# Patient Record
Sex: Male | Born: 1935 | Race: White | Hispanic: No | Marital: Married | Smoking: Former smoker
Health system: Southern US, Community
[De-identification: ages and names within clinical notes are randomized; demographics above are authoritative.]

## PROBLEM LIST (undated history)

## (undated) DIAGNOSIS — E782 Mixed hyperlipidemia: Secondary | ICD-10-CM

## (undated) DIAGNOSIS — R0602 Shortness of breath: Secondary | ICD-10-CM

## (undated) DIAGNOSIS — E119 Type 2 diabetes mellitus without complications: Secondary | ICD-10-CM

## (undated) DIAGNOSIS — J189 Pneumonia, unspecified organism: Secondary | ICD-10-CM

## (undated) DIAGNOSIS — I10 Essential (primary) hypertension: Secondary | ICD-10-CM

## (undated) DIAGNOSIS — I350 Nonrheumatic aortic (valve) stenosis: Secondary | ICD-10-CM

## (undated) DIAGNOSIS — R011 Cardiac murmur, unspecified: Secondary | ICD-10-CM

## (undated) DIAGNOSIS — K5900 Constipation, unspecified: Secondary | ICD-10-CM

## (undated) DIAGNOSIS — E039 Hypothyroidism, unspecified: Secondary | ICD-10-CM

## (undated) DIAGNOSIS — N189 Chronic kidney disease, unspecified: Secondary | ICD-10-CM

## (undated) DIAGNOSIS — I209 Angina pectoris, unspecified: Secondary | ICD-10-CM

## (undated) DIAGNOSIS — D649 Anemia, unspecified: Secondary | ICD-10-CM

## (undated) DIAGNOSIS — I2510 Atherosclerotic heart disease of native coronary artery without angina pectoris: Secondary | ICD-10-CM

## (undated) HISTORY — PX: NEPHRECTOMY: SHX65

## (undated) HISTORY — PX: OTHER SURGICAL HISTORY: SHX169

## (undated) HISTORY — DX: Mixed hyperlipidemia: E78.2

## (undated) HISTORY — PX: CARDIAC CATHETERIZATION: SHX172

## (undated) HISTORY — DX: Type 2 diabetes mellitus without complications: E11.9

## (undated) HISTORY — DX: Essential (primary) hypertension: I10

## (undated) HISTORY — PX: ROTATOR CUFF REPAIR: SHX139

## (undated) HISTORY — PX: COLONOSCOPY: SHX174

## (undated) HISTORY — DX: Atherosclerotic heart disease of native coronary artery without angina pectoris: I25.10

## (undated) HISTORY — DX: Nonrheumatic aortic (valve) stenosis: I35.0

## (undated) HISTORY — PX: VASECTOMY: SHX75

---

## 2001-09-25 ENCOUNTER — Ambulatory Visit (HOSPITAL_COMMUNITY): Admission: RE | Admit: 2001-09-25 | Discharge: 2001-09-26 | Payer: Self-pay | Admitting: Cardiology

## 2004-03-13 ENCOUNTER — Ambulatory Visit: Payer: Self-pay | Admitting: Cardiology

## 2004-03-27 ENCOUNTER — Ambulatory Visit: Payer: Self-pay | Admitting: Cardiology

## 2004-04-24 ENCOUNTER — Ambulatory Visit (HOSPITAL_COMMUNITY): Admission: RE | Admit: 2004-04-24 | Discharge: 2004-04-24 | Payer: Self-pay | Admitting: Orthopedic Surgery

## 2004-05-04 ENCOUNTER — Ambulatory Visit: Payer: Self-pay | Admitting: Internal Medicine

## 2004-06-17 ENCOUNTER — Encounter: Admission: RE | Admit: 2004-06-17 | Discharge: 2004-06-17 | Payer: Self-pay | Admitting: Orthopedic Surgery

## 2004-06-18 ENCOUNTER — Ambulatory Visit (HOSPITAL_BASED_OUTPATIENT_CLINIC_OR_DEPARTMENT_OTHER): Admission: RE | Admit: 2004-06-18 | Discharge: 2004-06-18 | Payer: Self-pay | Admitting: Orthopedic Surgery

## 2004-06-18 ENCOUNTER — Encounter (INDEPENDENT_AMBULATORY_CARE_PROVIDER_SITE_OTHER): Payer: Self-pay | Admitting: Specialist

## 2004-06-18 ENCOUNTER — Ambulatory Visit (HOSPITAL_COMMUNITY): Admission: RE | Admit: 2004-06-18 | Discharge: 2004-06-18 | Payer: Self-pay | Admitting: Orthopedic Surgery

## 2005-04-26 ENCOUNTER — Ambulatory Visit: Payer: Self-pay | Admitting: Cardiology

## 2005-07-28 ENCOUNTER — Ambulatory Visit: Payer: Self-pay | Admitting: Cardiology

## 2006-09-08 ENCOUNTER — Ambulatory Visit: Payer: Self-pay | Admitting: Cardiology

## 2010-06-16 NOTE — Assessment & Plan Note (Signed)
[facility]                          [facility] OFFICE NOTE   MARIE, CHOW                      MRN:          [id number]  DATE:09/08/2006                            DOB:          Jun 11, 1935    PRIMARY CARE PHYSICIAN:  Selinda Flavin, MD   REASON FOR VISIT:  Cardiac followup.   HISTORY OF PRESENT ILLNESS:  Mr. Wissmann returns for a yearly followup. He  has a history of coronary artery disease status post drug eluting stent  placement to the left anterior descending in August 2003. Last ischemic  evaluation was in March of 2007, demonstrating no frank ischemia. He did  have a hypertensive response to exercise at that time and is  hypertensive at rest today. He has been compliant with his medications.  We talked about regular exercise and he has not been participating in a  regular regimen. He has been retired for about a year and a half and has  been doing some traveling. He is not reporting any significant angina.  He does get mildly short of breath with activities, but nothing that has  been progressive.   His electrocardiogram today shows sinus rhythm with leftward axis and  voltage criteria for left ventricular hypertrophy.   ALLERGIES:  CODEINE AND SULFA DRUGS.   PRESENT MEDICATIONS:  1. NovoLog sliding scale.  2. Terazosin 5 mg p.o. q nightly.  3. Simvastatin 80 mg p.o. nightly.  4. Synthroid 100 mcg p.o. daily.  5. Toprol XL 50 mg p.o. daily.  6. Aspirin 81 mg p.o. daily.  7. Lantus 45 units daily.  8. Lisinopril/hydrochlorothiazide 20/25 mg p.o. daily.  9. Januvia 100 mg p.o. daily.  10.Terazosin 5 mg p.o. q nightly.   REVIEW OF SYSTEMS:  As described in History of Present Illness.   PHYSICAL EXAMINATION:  Blood pressure 180/85, heart rate 66. Weight is  197.4 pounds. The patient is comfortable, normally nourished, in no  acute distress.  NECK: Reveals no elevated jugular venous pressure, without bruits. No  thyromegaly  is noted.  LUNGS:  Are clear without labored breathing.  CARDIAC: Reveals a regular rate and rhythm. No S3 gallop or pericardial  rub.  ABDOMEN: Soft. No bruits.  EXTREMITIES: Show no pitting edema.   IMPRESSIONS AND RECOMMENDATIONS:  1. Coronary artery disease, previous drug eluting stent placement to      the left anterior descending. Mr. Savo is not reporting any new      symptoms. Cardiolite last year demonstrated no frank ischemia. I      have recommended a basic exercise regimen beginning with walking      and perhaps a workout routine through the [facility]. He states that he      does qualify for the [employer] program. He remains      hypertensive and I suspect that he will need additional medication      adjustments by Dr.  Dimas Aguas along the way. Present dose of Toprol XL      is keeping his heart rate in the 60s so it may be worth considering  an increase in lisinopril/hydrochlorothiazide versus the addition      of Norvasc to his regimen. He will plan to followup with Dr.      Dimas Aguas who is also following cholesterol. Goal LDL should be around      70. I will plan to see him back in one years time.  2. Further recommendations to follow.     Jonelle Sidle, MD  Electronically Signed    SGM/MedQ  DD: 09/08/2006  DT: 09/08/2006  Job #: [id number]   cc:   Selinda Flavin

## 2010-06-19 NOTE — Discharge Summary (Signed)
NAME:  Corey Riley, Corey Riley NO.:  [id number]   MEDICAL RECORD NO.:  [id number]                   PATIENT TYPE:  OIB   LOCATION:  6531                                 FACILITY:  [facility]   PHYSICIAN:  Learta Codding, M.D. [facility]             DATE OF BIRTH:  04-28-1935   DATE OF ADMISSION:  DATE OF DISCHARGE:                           DISCHARGE SUMMARY - REFERRING   PROCEDURES:  Coronary angiogram, status post LAD.]   REASON FOR ADMISSION:  The patient is a 75 year old male, with no prior  history of heart disease, with cardiac risk factors notable for type 2  diabetes mellitus, age and sex, who was referred to Dr. Lewayne Bunting at  [facility] for a stress echocardiography for evaluation of  progressive exertional dyspnea. The test was markedly abnormal, and the  patient was referred for a diagnostic coronary angiography. Please refer to  dictated admission note for full details.   LABORATORY DATA:  (Preadmission).  Sodium 140, potassium  4.3,  glucose 141,  BUN 17, creatinine 1.2. WBC 6.4. HGPT 12.8, HCT 28.6, platelets 263. INR  0.9.   Chest x-ray:  Normal chest.   HOSPITAL COURSE:  The patient presented for elective cardiac  catheterization, performed by Dr. [name], please see report for full  details, revealing essentially two vessel coronary artery disease with  normal left ventricular function. Specifically, there was 20% OMCA, 75% to  80% proximal LAD; 80% DX 1 lesions including the bifurcation; 20%  proximal/mid CFX; 30% proximal, 40 mid CFX.   Following review of films with Dr. [name], a recommendation was made to  proceed with PCI of the  LAD lesion. The procedure was successful stenting  with Cypher of the  80% LAD lesion to 0% residual stenosis with no noted  complications. Of note, he did attempt to visualize the CFX artery with the  IVUS per Asteroid protocol, but this was  aborted secondary to development  of chest pain.  The patient was   kept for overnight  observation and was  cleared for discharge the following morning in  hemodynamically stable  condition.  Hemoglobin 11.6, BUN 15 and creatinine 1.3 at discharge.   MEDICATIONS:  Medication adjustments:  1. Addition of Plavix for at least three months (Cypher stent).  2. Zocor.  The patient had recently been given prescriptions for Toprol and  nitroglycerin.   DISCHARGE MEDICATIONS:  1. Plavix 75 mg p.o. q.d.  x  3 months.  2. Coated aspirin 325 mg q.d.  3. Metformin 1000 mg b.i.d.  4. Avandia 80 mg q.d.  5. Prandin 4 mg t.i.d.  6. Lotensin HVT 20/2.5 mg q.d.  7. Synthroid 0.175 mg q.d.  8. Toprol XL 25 mg q.d.  9. Glucotrol XL 10 mg q.d.  10.      Zocor 200 mg q.h.s.  11.      Nitrostat 0.4 mg p.r.n.   DISCHARGE INSTRUCTIONS:  The patient is to resume Glucophage Thursday  (September 28, 2001), approximately 48 hours post cardiac catheterization. The  patient is to refrain from any heavy lifting, driving or strenuous activity  for two days.  Low fat, low cholesterol diabetic diet. He may call the  office if there is any swelling  or bleeding at the groin.  The patient was  instructed to refrain from undergoing any MRI testing for two months; he  should be placed on SV prophylaxis x 3 months.   FOLLOW UP:  The patient is scheduled to follow up with Dr. Bufford Buttner. on Tuesday, October 10, 2001, at 3:30 p.m. at [facility].  The patient will return to Dr. Selinda Flavin on an as needed basis.   DISCHARGE DIAGNOSES:  1. Coronary artery disease.     A. Status post stent (Cypher), 80% LAD September 25, 2001.     B. Normal left ventricular function.  2. Multiple cardiac risk factors, type 2 diabetes mellitus.  3. Treated hypothyroidism.  4. History of hypertension.       Laurell Coalson DICTATOR                          Learta Codding, M.D. [facility]    DD/MEDQ  D:  09/26/2001  T:  09/27/2001  Job:  [id number]   cc:   [facility]   Selinda Flavin

## 2010-06-19 NOTE — Cardiovascular Report (Signed)
   NAME:  Corey Riley, Corey Riley NO.:  [id number]   MEDICAL RECORD NO.:  [id number]                   PATIENT TYPE:  OIB   LOCATION:  6531                                 FACILITY:  [facility]   PHYSICIAN:  Noralyn Pick. Eden Emms, M.D. [facility]           DATE OF BIRTH:  Jan 08, 1936   DATE OF PROCEDURE:  09/25/2001  DATE OF DISCHARGE:                              CARDIAC CATHETERIZATION   INDICATIONS FOR PROCEDURE:  A diabetic with exertional dyspnea and positive  stress echocardiogram with anteroapical wall hypokinesis.   DESCRIPTION OF PROCEDURE:  Standard catheterization was done from the right  femoral artery.   FINDINGS:  1. The left main coronary artery had 20% discrete stenosis.  2. The left anterior descending artery had a 70-80% proximal lesion.  The     first diagonal branch was a medium-sized vessel with a bifurcation.     There was a 70-80% lesion at the bifurcation as well as 70-80% lesions in     both inferior and superior bifurcating branches.  3. The ramus branch had 20-30% multiple discrete lesions.  4. The circumflex coronary artery had 20% multiple discrete lesions in the     proximal and mid vessel.  5. The right coronary artery was dominant and has 30% multiple discrete     lesions proximally, 40% mid vessel disease.  6. RAO ventriculography:  RAO ventriculography was normal.  EF was 60%.     There was no gradient across the aortic valve and no MR.  Aortic pressure     was 186/78.  LV pressure was 186/18.   IMPRESSION:  I will review the films with Dr. [name].  The patient  essentially has single-vessel disease although the diagonal branch is medium-  sized.  I think an initial attempt at angioplasty or stenting of the LAD  would be in order.  Whether or not to simply balloon the branches of the  diagonal branch would be left up to the interventionalist.  The patient  could then have a followup stress echocardiogram to further assess  revascularization.   The films will be reviewed with Dr. [name].                                               Noralyn Pick. Eden Emms, M.D. [facility]    PCN/MEDQ  D:  09/25/2001  T:  09/26/2001  Job:  [id number]

## 2010-06-19 NOTE — Cardiovascular Report (Signed)
NAME:  Corey Riley, Corey Riley NO.:  [id number]   MEDICAL RECORD NO.:  [id number]                   PATIENT TYPE:  OIB   LOCATION:  6531                                 FACILITY:  [facility]   PHYSICIAN:  Arturo Morton. [name], M.D. [facility]         DATE OF BIRTH:  [birth year]   DATE OF PROCEDURE:  DATE OF DISCHARGE:  09/26/2001                              CARDIAC CATHETERIZATION   INDICATIONS FOR PROCEDURE:  The patient is a 75 year old gentleman who has  had exertional dyspnea.  He had an abnormal exercise echocardiogram with  ischemia in the LAD territory.  He underwent diagnostic catheterization by  Dr. Eden Emms who recommended percutaneous intervention of the left anterior  descending artery.  I discussed the case with the patient and subsequently  with his wife.  They were in agreement to proceed.  The patient had also  been consented for the ASTEROID study and wished to proceed with this as  well.   PROCEDURE:  1. Percutaneous transluminal coronary angioplasty and stenting of the left     anterior descending artery.  2. Aborted intravascular ultrasound of the circumflex for ASTEROID study     secondary to chest pain.   DESCRIPTION OF PROCEDURE:  The patient had undergone diagnostic  catheterization by Dr. Eden Emms in the catheterization suite.  After  discussion with the patient, we had planned to proceed.  He had already  received clopidogrel prior to the procedure.  A JL3.5 guiding catheter was  utilized to intubate the left anterior descending artery.  Angiomax was  given according to protocol and the ACT was in excess of 200 seconds, and  actually in excess of 300 seconds.  The lesion was crossed with a 0.014 Hi-  Torque Floppy wire and then predilated using a 2.25 CrossSail balloon.  We  then attempted to pass a stent but were unable to pass the 2.5 x 28-mm  CYPHER stent into the artery.  This was due to calcification in the mid  vessel.  We then used a buddy  wire system, specifically using a BMW wire  down the vessel which allowed the stent to easily be passed into the distal  vessel.  The stent was then carefully positioned and the other wire removed.  The stent was then taken up to about 8 atmospheres.  Post dilatation was  done with a 2.5 x 18-mm length PowerSail balloon.  Multiple inflations were  done up to 10 atmospheres with marked improvement in the appearance of the  artery.  All catheters were subsequently removed.  The decision was then  made to proceed on with the ASTEROID study and the wire was gently  manipulated down the circumflex artery.  We gave intracoronary  nitroglycerin.  The patient then subsequently developed some chest pain,  possibly partially related to the intracoronary nitroglycerin  administration, but with the recurrent chest pain, followup views were  obtained which did not reveal significant  renarrowing at the stent site nor  abnormalities of the circumflex that were different from the diagnostic  study.  We removed the wire plus the patient's pressure came back up, and  the chest pain was relieved.  We elected not to proceed on with the ASTEROID  study portion.   ANGIOGRAPHIC DATA:  1. The left anterior descending artery has an 80% segmental stenosis beyond     the origin of the diagonal.  The diagonal itself is diffusely diseased as     described in the diagnostic study.  Following stenting, the LAD is     reduced down from 80% to 0% with a 28-mm length stent.  There was     excellent transition in the stent at both ends and excellent runoff of     the contrast into the distal vessel.  2. The circumflex appears unchanged from the diagnostic study.    CONCLUSION:  Successful percutaneous stenting of the left anterior  descending artery with a drug-eluting CYPHER stent.   The patient will be treated with aspirin and Plavix and be treated  aggressively.  Followup will be with Dr. Andee Lineman.                                                Arturo Morton. [name], M.D. [facility]    TDS/MEDQ  D:  09/25/2001  T:  09/26/2001  Job:  [id number]   cc:   Lorie Phenix, M.D. [facility]   Cardiac Catheterization Lab   Noralyn Pick. Eden Emms, M.D. [facility]

## 2010-06-19 NOTE — Op Note (Signed)
NAME:  Corey Riley, Corey Riley NO.:  [id number]   MEDICAL RECORD NO.:  [id number]          PATIENT TYPE:  AMB   LOCATION:  DSC                          FACILITY:  [facility]   PHYSICIAN:  Katy Fitch. Sypher, M.D. 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   POSTOPERATIVE DIAGNOSIS:  1.  Necrotic full-thickness rotator cuff tear involving supraspinatus      tendon.  2.  Degenerative labral tear with calcification.  3.  Severe AC arthropathy with inferior projecting osteophytes.  4.  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   OPERATIONS:  1.  Is examination of left shoulder under anesthesia followed by gentle      manipulation to relieve capsular adhesions.  2.  Diagnostic arthroscopy with debridement of degenerative labrum and deep      surface rotator cuff tear and documentation of status of glenohumeral      joint.  3.  Subacromial bursectomy and identification of ossified coracoacromial      ligament and AC arthropathy with necrotic full-thickness rotator cuff      tear.  4.  Subacromial decompression with resection of osteophyte coracoacromial      ligament and acromioplasty.  5.  Open distal clavicle resection.  6.  Reconstruction of supraspinatus tendon necrotic tear.   OPERATING SURGEON:  Katy Fitch. Sypher, M.D.   ASSISTANT:  Annye Rusk, PA-C.   ANESTHESIA:  General by endotracheal technique supplemented by interscalene  block.   SUPERVISING ANESTHESIOLOGIST:  Dr. Michelle Piper.   INDICATIONS:  Corey Riley is a 75 year old insulin-dependent diabetic  referred by Dr. Dimas Aguas of [place], [place], for evaluation and  management of a painful left shoulder.   Clinical examination revealed signs of adhesive capsulitis, AC arthropathy  and a probable rotator cuff tear.   An MRI documented a full-thickness rotator cuff tear involving the  supraspinatus tendon and severe AC joint degenerative change with inferior  projecting osteophytes. There was a suggestion of degenerative labral  changes.   Due to a failure to respond to nonoperative measures, [name] was advised  to proceed the operating room at this time.   Given his background medical issues, he was advised to seek preoperative  cardiac screening at [facility] and an evaluation by Dr. Dimas Aguas to  stabilize his medical predicaments.   He was evaluated by [facility] Service and cleared for surgery. He  is brought to the operating room at this time.   Preoperatively, he was noted to have elevated blood glucose.  It was treated  by Dr. Michelle Piper of the anesthesia service with IV insulin.   Preoperatively, he was advised of potential risks and benefits of surgery.  He understands that due to his medical predicament, he has  greater risk for  complications such as deep vein thrombosis,  pulmonary embolus, wound  infection and shoulder stiffness.   After informed consent, he is brought to the operating room at this time.   PROCEDURE:  Corey Riley is brought to the operating room and placed in  supine position upon the operating table.   Following the induction of general orotracheal anesthesia, he was carefully  positioned in the beach-chair position with the aid of a torso and head  holder designed for shoulder arthroscopy.   Ancef 1 g was administered as an IV prophylactic antibiotic.   The entire left upper extremity and forequarter were prepped with DuraPrep  and draped with impervious arthroscopy drapes.   Prior to beginning the procedure, examination of the shoulder under  anesthesia revealed elevation to 160 degrees,  external rotation of 60,  internal rotation of approximately 40 degrees.   With gentle manipulation, elevation was increased 175 degrees, external  rotation to 85 degrees and internal rotation to 70 degrees. There were  clearly adhesions that were gently released.   The shoulder joint was then distended with 20 mL of sterile saline brought  in through an anterior portal with a spinal needle. The scope was placed  atraumatically through a standard posterior viewing portal. Diagnostic  arthroscopy revealed intact hyaline articular cartilage surfaces on the  humeral head and glenoid. The labrum was calcified and degenerative from 2  o'clock anteriorly to 9 o'clock posteriorly. An anterior portal was created  and a suction shaver was used to debride the calcium deposits off the labrum  as well as labral fragments.   The deep surface of the rotator cuff was carefully inspected and found to be  necrotic with a full-thickness tear. This was debrided with a suction  shaver.   The biceps origin was sound and the biceps tendon was normal through the  rotator interval. The infraspinatus and teres minor tendons were intact. The  inferior capsular recesses were unremarkable as were the anterior  glenohumeral ligaments.   The scope was removed from the glenohumeral joint and placed subacromial  space.   A florid bursitis was noted.   After bursectomy was accomplished, there was noted to be extensive  ossification of the coracoacromial ligament and a very prominent AC joint.  The bursal side of the rotator cuff tear was easily visualized and found to  be retracted and quite necrotic with delamination.   At this point, the procedure was converted to open rotator cuff  reconstruction.   A 4 cm incision was fashioned from the [facility] joint across the anterior  acromion. Capsule of AC joint was taken down and the distal 15 mm of clavicle exposed subperiosteally with an osteotome. The meniscus was  removed  from the [facility] joint followed by resection of the distal 15 mm of clavicle,  removing the trumpet bell-shaped osteophytes from the distal clavicle. The  ossified coracoacromial ligament was removed with the rongeur followed by  careful tailoring of the acromion to a type I morphology with an oscillating  saw and hand rasp.   The bursa overlying the cuff tear was debrided with a rongeur followed by  freshening of the margins of the tear with resection of multiple fragments  of calcified rotten supraspinatus tendon. The greater tuberosity was then  decorticated with a power bur followed by placement of a 5.5 mm BioCorkscrew  anchor for medial foot print attachment, followed by placement of a  McLaughlin grasping suture that was placed with a baseball stitch into the  supraspinatus to gather its fibers  and advanced laterally to the  decorticated greater tuberosity.   After completion of the McLaughlin stitch, the tendon was advanced laterally  and two mattress sutures were used to inset the medial foot print with the  BioCorkscrew anchor.   The McLaughlin suture was tensioned, creating a near anatomic repair. A  small defect laterally should fill in the scar at the site of the  decorticated tuberosity.   After further bursal debridement, range of motion was checked. There no  residual signs of impingement.   The anterior third of deltoid was then repaired to the trapezius muscle  closing the dead space created by distal clavicle resection and repairing  the anterior third of the deltoid with through periosteum and muscle sutures  creating a stout repair of the anterior third of deltoid to the acromion.   The wound was then repaired with subdermal sutures of 0-Vicryl, 2-0 Vicryl  and intradermal 3-0 Prolene.   There were no apparent complications.   Mr. Godsey tolerated surgery and anesthesia well. He was transferred to the  recovery room with stable vital signs.   He will be  discharged an approximate 24 hours in the care of his family,  anticipating prescriptions of Dilaudid 2 mg one p.o. q.4-6h. pain 30 tablets  without refill.  Also Motrin 6 mg one p.o. q.6h. p.r.n. pain 30 tablets with  one refill and Keflex 5 mg  one p.o. q.8h. x4 days as a prophylactic  antibiotic.      RVS/MEDQ  D:  06/18/2004  T:  06/18/2004  Job:  [id number]

## 2011-10-08 ENCOUNTER — Ambulatory Visit (INDEPENDENT_AMBULATORY_CARE_PROVIDER_SITE_OTHER): Payer: Medicare Other | Admitting: Urology

## 2011-10-08 DIAGNOSIS — R972 Elevated prostate specific antigen [PSA]: Secondary | ICD-10-CM

## 2011-10-08 DIAGNOSIS — N401 Enlarged prostate with lower urinary tract symptoms: Secondary | ICD-10-CM

## 2012-01-28 ENCOUNTER — Encounter (HOSPITAL_COMMUNITY): Payer: Self-pay

## 2012-02-01 ENCOUNTER — Encounter (HOSPITAL_COMMUNITY): Payer: Self-pay

## 2012-02-01 ENCOUNTER — Encounter (HOSPITAL_COMMUNITY)
Admission: RE | Admit: 2012-02-01 | Discharge: 2012-02-01 | Disposition: A | Discharge status: Home / Self Care | Payer: Medicare Other | Source: Ambulatory Visit | Attending: Ophthalmology | Admitting: Ophthalmology

## 2012-02-01 DIAGNOSIS — E119 Type 2 diabetes mellitus without complications: Secondary | ICD-10-CM | POA: Insufficient documentation

## 2012-02-01 DIAGNOSIS — Z01812 Encounter for preprocedural laboratory examination: Secondary | ICD-10-CM | POA: Insufficient documentation

## 2012-02-01 DIAGNOSIS — Z01810 Encounter for preprocedural cardiovascular examination: Secondary | ICD-10-CM | POA: Insufficient documentation

## 2012-02-01 DIAGNOSIS — I10 Essential (primary) hypertension: Secondary | ICD-10-CM | POA: Insufficient documentation

## 2012-02-01 HISTORY — DX: Hypothyroidism, unspecified: E03.9

## 2012-02-01 LAB — BASIC METABOLIC PANEL
BUN: 64 mg/dL — ABNORMAL HIGH (ref 6–23)
Creatinine, Ser: 2.58 mg/dL — ABNORMAL HIGH (ref 0.50–1.35)
GFR calc Af Amer: 27 mL/min — ABNORMAL LOW (ref >90)
GFR calc non Af Amer: 23 mL/min — ABNORMAL LOW (ref >90)
Glucose, Bld: 297 mg/dL — ABNORMAL HIGH (ref 70–99)

## 2012-02-01 MED ORDER — CYCLOPENTOLATE-PHENYLEPHRINE 0.2-1 % OP SOLN
OPHTHALMIC | Status: AC
  Filled 2012-02-01: qty 2

## 2012-02-01 NOTE — Patient Instructions (Addendum)
Your procedure is scheduled on: 02/07/2012   Report to [facility] at   6:15   AM.  Call this number if you have problems the morning of surgery: [phone]   Remember:   Do not eat or drink :After Midnight.    Take these medicines the morning of surgery with A SIP OF WATER: Lisinopril, Metoprolol, Isosorbide, and Adalat   Do not wear jewelry, make-up or nail polish.  Do not wear lotions, powders, or perfumes. You may wear deodorant.  Do not shave 48 hours prior to surgery.  Do not bring valuables to the hospital.  Contacts, dentures or bridgework may not be worn into surgery.  Patients discharged the day of surgery will not be allowed to drive home.  Name and phone number of your driver:    Please read over the following fact sheets that you were given: Pain Booklet, Surgical Site Infection Prevention, Anesthesia Post-op Instructions and Care and Recovery After Surgery  Cataract Surgery  A cataract is a clouding of the lens of the eye. When a lens becomes cloudy, vision is reduced based on the degree and nature of the clouding. Surgery may be needed to improve vision. Surgery removes the cloudy lens and usually replaces it with a substitute lens (intraocular lens, IOL). LET YOUR EYE DOCTOR KNOW ABOUT:  Allergies to food or medicine.   Medicines taken including herbs, eyedrops, over-the-counter medicines, and creams.   Use of steroids (by mouth or creams).   Previous problems with anesthetics or numbing medicine.   History of bleeding problems or blood clots.   Previous surgery.   Other health problems, including diabetes and kidney problems.   Possibility of pregnancy, if this applies.  RISKS AND COMPLICATIONS  Infection.   Inflammation of the eyeball (endophthalmitis) that can spread to both eyes (sympathetic ophthalmia).   Poor wound healing.   If an IOL is inserted, it can later fall out of proper position. This is very uncommon.   Clouding of the part of your eye  that holds an IOL in place. This is called an "after-cataract." These are uncommon, but easily treated.  BEFORE THE PROCEDURE  Do not eat or drink anything except small amounts of water for 8 to 12 before your surgery, or as directed by your caregiver.   Unless you are told otherwise, continue any eyedrops you have been prescribed.   Talk to your primary caregiver about all other medicines that you take (both prescription and non-prescription). In some cases, you may need to stop or change medicines near the time of your surgery. This is most important if you are taking blood-thinning medicine.Do not stop medicines unless you are told to do so.   Arrange for someone to drive you to and from the procedure.   Do not put contact lenses in either eye on the day of your surgery.  PROCEDURE There is more than one method for safely removing a cataract. Your doctor can explain the differences and help determine which is best for you. Phacoemulsification surgery is the most common form of cataract surgery.  An injection is given behind the eye or eyedrops are given to make this a painless procedure.   A small cut (incision) is made on the edge of the clear, dome-shaped surface that covers the front of the eye (cornea).   A tiny probe is painlessly inserted into the eye. This device gives off ultrasound waves that soften and break up the cloudy center of the lens. This  makes it easier for the cloudy lens to be removed by suction.   An IOL may be implanted.   The normal lens of the eye is covered by a clear capsule. Part of that capsule is intentionally left in the eye to support the IOL.   Your surgeon may or may not use stitches to close the incision.  There are other forms of cataract surgery that require a larger incision and stiches to close the eye. This approach is taken in cases where the doctor feels that the cataract cannot be easily removed using phacoemulsification. AFTER THE  PROCEDURE  When an IOL is implanted, it does not need care. It becomes a permanent part of your eye and cannot be seen or felt.   Your doctor will schedule follow-up exams to check on your progress.   Review your other medicines with your doctor to see which can be resumed after surgery.   Use eyedrops or take medicine as prescribed by your doctor.  Document Released: 01/07/2011 Document Reviewed: 01/04/2011 [facility] Patient Information 2012 [place].  .Cataract Surgery Care After Refer to this sheet in the next few weeks. These instructions provide you with information on caring for yourself after your procedure. Your caregiver may also give you more specific instructions. Your treatment has been planned according to current medical practices, but problems sometimes occur. Call your caregiver if you have any problems or questions after your procedure.  HOME CARE INSTRUCTIONS   Avoid strenuous activities as directed by your caregiver.   Ask your caregiver when you can resume driving.   Use eyedrops or other medicines to help healing and control pressure inside your eye as directed by your caregiver.   Only take over-the-counter or prescription medicines for pain, discomfort, or fever as directed by your caregiver.   Do not to touch or rub your eyes.   You may be instructed to use a protective shield during the first few days and nights after surgery. If not, wear sunglasses to protect your eyes. This is to protect the eye from pressure or from being accidentally bumped.   Keep the area around your eye clean and dry. Avoid swimming or allowing water to hit you directly in the face while showering. Keep soap and shampoo out of your eyes.   Do not bend or lift heavy objects. Bending increases pressure in the eye. You can walk, climb stairs, and do light household chores.   Do not put a contact lens into the eye that had surgery until your caregiver says it is okay to do so.    Ask your doctor when you can return to work. This will depend on the kind of work that you do. If you work in a dusty environment, you may be advised to wear protective eyewear for a period of time.   Ask your caregiver when it will be safe to engage in sexual activity.   Continue with your regular eye exams as directed by your caregiver.  What to expect:  It is normal to feel itching and mild discomfort for a few days after cataract surgery. Some fluid discharge is also common, and your eye may be sensitive to light and touch.   After 1 to 2 days, even moderate discomfort should disappear. In most cases, healing will take about 6 weeks.   If you received an intraocular lens (IOL), you may notice that colors are very bright or have a blue tinge. Also, if you have been in bright  sunlight, everything may appear reddish for a few hours. If you see these color tinges, it is because your lens is clear and no longer cloudy. Within a few months after receiving an IOL, these extra colors should go away. When you have healed, you will probably need new glasses.  SEEK MEDICAL CARE IF:   You have increased bruising around your eye.   You have discomfort not helped by medicine.  SEEK IMMEDIATE MEDICAL CARE IF:   You have a fever.   You have a worsening or sudden vision loss.   You have redness, swelling, or increasing pain in the eye.   You have a thick discharge from the eye that had surgery.  MAKE SURE YOU:  Understand these instructions.   Will watch your condition.   Will get help right away if you are not doing well or get worse.  Document Released: 08/07/2004 Document Revised: 01/07/2011 Document Reviewed: 09/11/2010 [facility] Patient Information 2012 [place], [place].

## 2012-02-01 NOTE — Pre-Procedure Instructions (Signed)
Dr Jayme Cloud notified of labs from [facility].  K 5.6, BUN 59, Creat 2.82.  Ordered to repeat BMP

## 2012-02-07 ENCOUNTER — Encounter (HOSPITAL_COMMUNITY): Payer: Self-pay | Admitting: Anesthesiology

## 2012-02-07 ENCOUNTER — Encounter (HOSPITAL_COMMUNITY): Payer: Self-pay | Admitting: *Deleted

## 2012-02-07 ENCOUNTER — Ambulatory Visit (HOSPITAL_COMMUNITY)
Admission: RE | Admit: 2012-02-07 | Discharge: 2012-02-07 | Disposition: A | Discharge status: Home / Self Care | Payer: Medicare Other | Source: Ambulatory Visit | Attending: Ophthalmology | Admitting: Ophthalmology

## 2012-02-07 ENCOUNTER — Encounter (HOSPITAL_COMMUNITY)
Admission: RE | Disposition: A | Discharge status: Home / Self Care | Payer: Self-pay | Source: Ambulatory Visit | Attending: Ophthalmology

## 2012-02-07 ENCOUNTER — Ambulatory Visit (HOSPITAL_COMMUNITY): Payer: Medicare Other | Admitting: Anesthesiology

## 2012-02-07 DIAGNOSIS — I10 Essential (primary) hypertension: Secondary | ICD-10-CM | POA: Insufficient documentation

## 2012-02-07 DIAGNOSIS — E119 Type 2 diabetes mellitus without complications: Secondary | ICD-10-CM | POA: Insufficient documentation

## 2012-02-07 DIAGNOSIS — H2510 Age-related nuclear cataract, unspecified eye: Secondary | ICD-10-CM | POA: Insufficient documentation

## 2012-02-07 DIAGNOSIS — H5200 Hypermetropia, unspecified eye: Secondary | ICD-10-CM | POA: Insufficient documentation

## 2012-02-07 DIAGNOSIS — H524 Presbyopia: Secondary | ICD-10-CM | POA: Insufficient documentation

## 2012-02-07 DIAGNOSIS — Z794 Long term (current) use of insulin: Secondary | ICD-10-CM | POA: Insufficient documentation

## 2012-02-07 DIAGNOSIS — Z79899 Other long term (current) drug therapy: Secondary | ICD-10-CM | POA: Insufficient documentation

## 2012-02-07 DIAGNOSIS — H52209 Unspecified astigmatism, unspecified eye: Secondary | ICD-10-CM | POA: Insufficient documentation

## 2012-02-07 DIAGNOSIS — L719 Rosacea, unspecified: Secondary | ICD-10-CM | POA: Insufficient documentation

## 2012-02-07 DIAGNOSIS — Z7982 Long term (current) use of aspirin: Secondary | ICD-10-CM | POA: Insufficient documentation

## 2012-02-07 HISTORY — PX: CATARACT EXTRACTION W/PHACO: SHX586

## 2012-02-07 LAB — GLUCOSE, CAPILLARY: Glucose-Capillary: 80 mg/dL (ref 70–99)

## 2012-02-07 SURGERY — PHACOEMULSIFICATION, CATARACT, WITH IOL INSERTION
Anesthesia: Monitor Anesthesia Care | Site: Eye | Laterality: Right | Wound class: Clean

## 2012-02-07 MED ORDER — GATIFLOXACIN 0.5 % OP SOLN OPTIME - NO CHARGE
OPHTHALMIC | Status: DC | PRN
  Administered 2012-02-07: 2 [drp] via OPHTHALMIC

## 2012-02-07 MED ORDER — KETOROLAC TROMETHAMINE 0.5 % OP SOLN: 1.0000 [drp] | Freq: Once | OPHTHALMIC | Status: AC

## 2012-02-07 MED ORDER — EPINEPHRINE HCL 1 MG/ML IJ SOLN
INTRAOCULAR | Status: DC | PRN
  Administered 2012-02-07: 08:00:00

## 2012-02-07 MED ORDER — KETOROLAC TROMETHAMINE 0.4 % OP SOLN - NO CHARGE
1.0000 [drp] | Freq: Once | OPHTHALMIC | Status: AC
  Administered 2012-02-07: 1 [drp] via OPHTHALMIC
  Filled 2012-02-07: qty 5

## 2012-02-07 MED ORDER — EPINEPHRINE HCL 1 MG/ML IJ SOLN
INTRAMUSCULAR | Status: AC
  Filled 2012-02-07: qty 1

## 2012-02-07 MED ORDER — ONDANSETRON HCL 4 MG/2ML IJ SOLN: 4.0000 mg | Freq: Once | INTRAMUSCULAR | Status: DC | PRN

## 2012-02-07 MED ORDER — TETRACAINE 0.5 % OP SOLN OPTIME - NO CHARGE
OPHTHALMIC | Status: DC | PRN
  Administered 2012-02-07: 2 [drp] via OPHTHALMIC

## 2012-02-07 MED ORDER — NA HYALUR & NA CHOND-NA HYALUR 0.55-0.5 ML IO KIT
PACK | INTRAOCULAR | Status: DC | PRN
  Administered 2012-02-07: 1 via OPHTHALMIC

## 2012-02-07 MED ORDER — BSS IO SOLN
INTRAOCULAR | Status: DC | PRN
  Administered 2012-02-07: 15 mL via INTRAOCULAR

## 2012-02-07 MED ORDER — LIDOCAINE 3.5 % OP GEL OPTIME - NO CHARGE
OPHTHALMIC | Status: DC | PRN
  Administered 2012-02-07: 2 [drp] via OPHTHALMIC

## 2012-02-07 MED ORDER — TETRACAINE HCL 0.5 % OP SOLN: 1.0000 [drp] | Freq: Once | OPHTHALMIC | Status: DC

## 2012-02-07 MED ORDER — MIDAZOLAM HCL 2 MG/2ML IJ SOLN
1.0000 mg | INTRAMUSCULAR | Status: DC | PRN
  Administered 2012-02-07: 2 mg via INTRAVENOUS

## 2012-02-07 MED ORDER — LACTATED RINGERS IV SOLN
INTRAVENOUS | Status: DC
  Administered 2012-02-07: 1000 mL via INTRAVENOUS

## 2012-02-07 MED ORDER — MOXIFLOXACIN HCL 0.5 % OP SOLN - NO CHARGE
1.0000 [drp] | Freq: Once | OPHTHALMIC | Status: AC
  Administered 2012-02-07: 1 [drp] via OPHTHALMIC
  Filled 2012-02-07: qty 3

## 2012-02-07 MED ORDER — TRYPAN BLUE 0.06 % OP SOLN
OPHTHALMIC | Status: AC
  Filled 2012-02-07: qty 0.5

## 2012-02-07 MED ORDER — CARBACHOL 0.01 % IO SOLN
INTRAOCULAR | Status: AC
  Filled 2012-02-07: qty 1.5

## 2012-02-07 MED ORDER — GATIFLOXACIN 0.5 % OP SOLN
1.0000 [drp] | Freq: Once | OPHTHALMIC | Status: AC
  Administered 2012-02-07: 1 [drp] via OPHTHALMIC

## 2012-02-07 MED ORDER — LIDOCAINE HCL 3.5 % OP GEL
OPHTHALMIC | Status: AC
  Filled 2012-02-07: qty 5

## 2012-02-07 MED ORDER — TETRACAINE HCL 0.5 % OP SOLN
OPHTHALMIC | Status: AC
  Filled 2012-02-07: qty 2

## 2012-02-07 MED ORDER — FENTANYL CITRATE 0.05 MG/ML IJ SOLN: 25.0000 ug | INTRAMUSCULAR | Status: DC | PRN

## 2012-02-07 MED ORDER — CYCLOPENTOLATE-PHENYLEPHRINE 0.2-1 % OP SOLN: 1.0000 [drp] | Freq: Once | OPHTHALMIC | Status: AC

## 2012-02-07 MED ORDER — LIDOCAINE HCL 3.5 % OP GEL: 1.0000 "application " | Freq: Once | OPHTHALMIC | Status: DC

## 2012-02-07 MED ORDER — LIDOCAINE HCL (PF) 1 % IJ SOLN
INTRAMUSCULAR | Status: AC
  Filled 2012-02-07: qty 2

## 2012-02-07 MED ORDER — MIDAZOLAM HCL 2 MG/2ML IJ SOLN
INTRAMUSCULAR | Status: AC
  Filled 2012-02-07: qty 2

## 2012-02-07 SURGICAL SUPPLY — 27 items
CAPSULAR TENSION RING-AMO (OPHTHALMIC RELATED) IMPLANT
CLOTH BEACON ORANGE TIMEOUT ST (SAFETY) ×1 IMPLANT
GLOVE BIO SURGEON STRL SZ7.5 (GLOVE) IMPLANT
GLOVE BIOGEL M 6.5 STRL (GLOVE) IMPLANT
GLOVE BIOGEL PI IND STRL 6.5 (GLOVE) IMPLANT
GLOVE BIOGEL PI IND STRL 7.0 (GLOVE) IMPLANT
GLOVE BIOGEL PI INDICATOR 6.5 (GLOVE)
GLOVE BIOGEL PI INDICATOR 7.0 (GLOVE) ×1
GLOVE ECLIPSE 6.5 STRL STRAW (GLOVE) IMPLANT
GLOVE ECLIPSE 7.5 STRL STRAW (GLOVE) IMPLANT
GLOVE EXAM NITRILE LRG STRL (GLOVE) IMPLANT
GLOVE EXAM NITRILE MD LF STRL (GLOVE) ×1 IMPLANT
GLOVE SKINSENSE NS SZ6.5 (GLOVE)
GLOVE SKINSENSE NS SZ7.0 (GLOVE)
GLOVE SKINSENSE STRL SZ6.5 (GLOVE) IMPLANT
GLOVE SKINSENSE STRL SZ7.0 (GLOVE) IMPLANT
INST SET CATARACT ~~LOC~~ (KITS) ×2 IMPLANT
KIT VITRECTOMY (OPHTHALMIC RELATED) IMPLANT
PAD ARMBOARD 7.5X6 YLW CONV (MISCELLANEOUS) ×1 IMPLANT
PROC W NO LENS (INTRAOCULAR LENS)
PROC W SPEC LENS (INTRAOCULAR LENS)
PROCESS W NO LENS (INTRAOCULAR LENS) IMPLANT
PROCESS W SPEC LENS (INTRAOCULAR LENS) IMPLANT
RING MALYGIN (MISCELLANEOUS) IMPLANT
SIGHTPATH CAT PROC W REG LENS (Ophthalmic Related) ×2 IMPLANT
VISCOELASTIC ADDITIONAL (OPHTHALMIC RELATED) IMPLANT
WATER STERILE IRR 250ML POUR (IV SOLUTION) ×1 IMPLANT

## 2012-02-07 NOTE — Brief Op Note (Signed)
02/07/2012  8:21 AM  PATIENT:  Corey Riley  77 y.o. male  PRE-OPERATIVE DIAGNOSIS:  nuclear cataract right eye  POST-OPERATIVE DIAGNOSIS:  nuclear cataract right eye  PROCEDURE:  Procedure(s): CATARACT EXTRACTION PHACO AND INTRAOCULAR LENS PLACEMENT (IOC)  SURGEON:  Surgeon(s): Susa Simmonds, MD  ASSISTANTS: Valda Lamb, CST   ANESTHESIA STAFF: Moshe Salisbury, CRNA - CRNA Laurene Footman, MD - Anesthesiologist  ANESTHESIA:   topical and MAC  REQUESTED LENS POWER: 23.0  LENS IMPLANT INFORMATION:   Alcon [id number] s/n [id number]  Exp 03/2016  CUMULATIVE DISSIPATED ENERGY:24.87  INDICATIONS:see H&P  OP FINDINGS:dense NS  COMPLICATIONS:None  DICTATION #: see scanned op note  PLAN OF CARE: per H&P  PATIENT DISPOSITION:  Short Stay

## 2012-02-07 NOTE — H&P (Signed)
I have reviewed the pre printed H&P, the patient was re-examined, and I have identified no significant interval changes in the patient's medical condition.  There is no change in the plan of care since the history and physical of record. 

## 2012-02-07 NOTE — Op Note (Signed)
See scanned note done today

## 2012-02-07 NOTE — Anesthesia Postprocedure Evaluation (Signed)
  Anesthesia Post-op Note  Patient: Corey Riley  Procedure(s) Performed: Procedure(s) (LRB) with comments: CATARACT EXTRACTION PHACO AND INTRAOCULAR LENS PLACEMENT (IOC) (Right) - CDE:24.87  Patient Location: PACU and Short Stay  Anesthesia Type:MAC  Level of Consciousness: awake, alert  and oriented  Airway and Oxygen Therapy: Patient Spontanous Breathing  Post-op Pain: none  Post-op Assessment: Post-op Vital signs reviewed, Patient's Cardiovascular Status Stable, Respiratory Function Stable, Patent Airway and No signs of Nausea or vomiting  Post-op Vital Signs: Reviewed and stable  Complications: No apparent anesthesia complications

## 2012-02-07 NOTE — Anesthesia Preprocedure Evaluation (Signed)
Anesthesia Evaluation  Patient identified by MRN, date of birth, ID band Patient awake    Reviewed: Allergy & Precautions, H&P , NPO status , Patient's Chart, lab work & pertinent test results  Airway Mallampati: II TM Distance: >3 FB     Dental  (+) Teeth Intact   Pulmonary former smoker,  breath sounds clear to auscultation        Cardiovascular hypertension, Pt. on medications - angina+ CAD and + Cardiac Stents Rhythm:Regular Rate:Normal     Neuro/Psych    GI/Hepatic   Endo/Other  diabetes, Poorly Controlled, Type 2Hypothyroidism   Renal/GU      Musculoskeletal   Abdominal   Peds  Hematology   Anesthesia Other Findings   Reproductive/Obstetrics                           Anesthesia Physical Anesthesia Plan  ASA: III  Anesthesia Plan: MAC   Post-op Pain Management:    Induction: Intravenous  Airway Management Planned: Nasal Cannula  Additional Equipment:   Intra-op Plan:   Post-operative Plan:   Informed Consent: I have reviewed the patients History and Physical, chart, labs and discussed the procedure including the risks, benefits and alternatives for the proposed anesthesia with the patient or authorized representative who has indicated his/her understanding and acceptance.     Plan Discussed with:   Anesthesia Plan Comments:         Anesthesia Quick Evaluation

## 2012-02-07 NOTE — Transfer of Care (Signed)
Immediate Anesthesia Transfer of Care Note  Patient: Corey Riley  Procedure(s) Performed: Procedure(s) (LRB) with comments: CATARACT EXTRACTION PHACO AND INTRAOCULAR LENS PLACEMENT (IOC) (Right) - CDE:24.87  Patient Location: PACU and Short Stay  Anesthesia Type:MAC  Level of Consciousness: awake, alert  and oriented  Airway & Oxygen Therapy: Patient Spontanous Breathing  Post-op Assessment: Report given to PACU RN  Post vital signs: Reviewed  Complications: No apparent anesthesia complications

## 2012-02-08 ENCOUNTER — Encounter (HOSPITAL_COMMUNITY): Payer: Self-pay | Admitting: Ophthalmology

## 2012-02-11 DIAGNOSIS — R011 Cardiac murmur, unspecified: Secondary | ICD-10-CM

## 2012-02-17 ENCOUNTER — Telehealth: Payer: Self-pay | Admitting: Cardiology

## 2012-02-17 NOTE — Telephone Encounter (Signed)
Dr Dimas Aguas needs to know if Echo you read showed that the stenosis is better or worse.  Patient is scheduled to see you on march 3rd. Dr Dimas Aguas would also like to know if this appointment would be good with results of Echo

## 2012-02-18 NOTE — Telephone Encounter (Signed)
Dr. Dimas Aguas informed and request this message be forwarded to his office.

## 2012-02-18 NOTE — Telephone Encounter (Signed)
Recent echocardiogram read on 1/10 showed only mild aortic stenosis. I do not see a previous echocardiogram in EMR for comparison, and therefore cannot comment on progression. At this point however, doubt that this is of major clinical significance since it is in the mild range, and we can followup on this at his office visit.  Jonelle Sidle, M.D., F.A.C.C.

## 2012-04-05 ENCOUNTER — Encounter: Payer: Self-pay | Admitting: Cardiology

## 2012-04-05 DIAGNOSIS — I350 Nonrheumatic aortic (valve) stenosis: Secondary | ICD-10-CM | POA: Insufficient documentation

## 2012-04-05 DIAGNOSIS — I2510 Atherosclerotic heart disease of native coronary artery without angina pectoris: Secondary | ICD-10-CM | POA: Insufficient documentation

## 2012-04-05 DIAGNOSIS — I10 Essential (primary) hypertension: Secondary | ICD-10-CM | POA: Insufficient documentation

## 2012-04-05 DIAGNOSIS — E782 Mixed hyperlipidemia: Secondary | ICD-10-CM | POA: Insufficient documentation

## 2012-04-07 ENCOUNTER — Ambulatory Visit: Payer: Medicare Other | Admitting: Cardiology

## 2012-04-11 ENCOUNTER — Encounter (HOSPITAL_COMMUNITY): Payer: Self-pay

## 2012-04-11 ENCOUNTER — Encounter (HOSPITAL_COMMUNITY)
Admission: RE | Admit: 2012-04-11 | Discharge: 2012-04-11 | Disposition: A | Discharge status: Home / Self Care | Payer: Medicare Other | Source: Ambulatory Visit | Attending: Ophthalmology | Admitting: Ophthalmology

## 2012-04-11 HISTORY — DX: Shortness of breath: R06.02

## 2012-04-11 HISTORY — DX: Chronic kidney disease, unspecified: N18.9

## 2012-04-11 HISTORY — DX: Anemia, unspecified: D64.9

## 2012-04-11 LAB — BASIC METABOLIC PANEL
CO2: 22 mEq/L (ref 19–32)
Calcium: 8.8 mg/dL (ref 8.4–10.5)
Chloride: 105 mEq/L (ref 96–112)
Glucose, Bld: 145 mg/dL — ABNORMAL HIGH (ref 70–99)
Sodium: 139 mEq/L (ref 135–145)

## 2012-04-11 LAB — HEMOGLOBIN AND HEMATOCRIT, BLOOD
HCT: 26.5 % — ABNORMAL LOW (ref 39.0–52.0)
Hemoglobin: 8.9 g/dL — ABNORMAL LOW (ref 13.0–17.0)

## 2012-04-11 MED ORDER — CYCLOPENTOLATE HCL 1 % OP SOLN
OPHTHALMIC | Status: AC
  Filled 2012-04-11: qty 2

## 2012-04-11 MED ORDER — CYCLOPENTOLATE-PHENYLEPHRINE 0.2-1 % OP SOLN
OPHTHALMIC | Status: AC
  Filled 2012-04-11: qty 2

## 2012-04-11 NOTE — Patient Instructions (Addendum)
Your procedure is scheduled on:  04/17/2012  Report to [facility] at   7:00   AM.  Call this number if you have problems the morning of surgery: [phone]   Remember:   Do not eat or drink :After Midnight.    Take these medicines the morning of surgery with A SIP OF WATER: levothroxine, Lisinopril, Metoprolol,and Nifedipine   Do not wear jewelry, make-up or nail polish.  Do not wear lotions, powders, or perfumes. You may wear deodorant.  Do not shave 48 hours prior to surgery.  Do not bring valuables to the hospital.  Contacts, dentures or bridgework may not be worn into surgery.  Patients discharged the day of surgery will not be allowed to drive home.  Name and phone number of your driver:    Please read over the following fact sheets that you were given: Pain Booklet, Surgical Site Infection Prevention, Anesthesia Post-op Instructions and Care and Recovery After Surgery  Cataract Surgery  A cataract is a clouding of the lens of the eye. When a lens becomes cloudy, vision is reduced based on the degree and nature of the clouding. Surgery may be needed to improve vision. Surgery removes the cloudy lens and usually replaces it with a substitute lens (intraocular lens, IOL). LET YOUR EYE DOCTOR KNOW ABOUT:  Allergies to food or medicine.   Medicines taken including herbs, eyedrops, over-the-counter medicines, and creams.   Use of steroids (by mouth or creams).   Previous problems with anesthetics or numbing medicine.   History of bleeding problems or blood clots.   Previous surgery.   Other health problems, including diabetes and kidney problems.   Possibility of pregnancy, if this applies.  RISKS AND COMPLICATIONS  Infection.   Inflammation of the eyeball (endophthalmitis) that can spread to both eyes (sympathetic ophthalmia).   Poor wound healing.   If an IOL is inserted, it can later fall out of proper position. This is very uncommon.   Clouding of the part of  your eye that holds an IOL in place. This is called an "after-cataract." These are uncommon, but easily treated.  BEFORE THE PROCEDURE  Do not eat or drink anything except small amounts of water for 8 to 12 before your surgery, or as directed by your caregiver.   Unless you are told otherwise, continue any eyedrops you have been prescribed.   Talk to your primary caregiver about all other medicines that you take (both prescription and non-prescription). In some cases, you may need to stop or change medicines near the time of your surgery. This is most important if you are taking blood-thinning medicine.Do not stop medicines unless you are told to do so.   Arrange for someone to drive you to and from the procedure.   Do not put contact lenses in either eye on the day of your surgery.  PROCEDURE There is more than one method for safely removing a cataract. Your doctor can explain the differences and help determine which is best for you. Phacoemulsification surgery is the most common form of cataract surgery.  An injection is given behind the eye or eyedrops are given to make this a painless procedure.   A small cut (incision) is made on the edge of the clear, dome-shaped surface that covers the front of the eye (cornea).   A tiny probe is painlessly inserted into the eye. This device gives off ultrasound waves that soften and break up the cloudy center of the lens. This makes  it easier for the cloudy lens to be removed by suction.   An IOL may be implanted.   The normal lens of the eye is covered by a clear capsule. Part of that capsule is intentionally left in the eye to support the IOL.   Your surgeon may or may not use stitches to close the incision.  There are other forms of cataract surgery that require a larger incision and stiches to close the eye. This approach is taken in cases where the doctor feels that the cataract cannot be easily removed using phacoemulsification. AFTER THE  PROCEDURE  When an IOL is implanted, it does not need care. It becomes a permanent part of your eye and cannot be seen or felt.   Your doctor will schedule follow-up exams to check on your progress.   Review your other medicines with your doctor to see which can be resumed after surgery.   Use eyedrops or take medicine as prescribed by your doctor.  Document Released: 01/07/2011 Document Reviewed: 01/04/2011 [facility] Patient Information 2012 [place].  .Cataract Surgery Care After Refer to this sheet in the next few weeks. These instructions provide you with information on caring for yourself after your procedure. Your caregiver may also give you more specific instructions. Your treatment has been planned according to current medical practices, but problems sometimes occur. Call your caregiver if you have any problems or questions after your procedure.  HOME CARE INSTRUCTIONS   Avoid strenuous activities as directed by your caregiver.   Ask your caregiver when you can resume driving.   Use eyedrops or other medicines to help healing and control pressure inside your eye as directed by your caregiver.   Only take over-the-counter or prescription medicines for pain, discomfort, or fever as directed by your caregiver.   Do not to touch or rub your eyes.   You may be instructed to use a protective shield during the first few days and nights after surgery. If not, wear sunglasses to protect your eyes. This is to protect the eye from pressure or from being accidentally bumped.   Keep the area around your eye clean and dry. Avoid swimming or allowing water to hit you directly in the face while showering. Keep soap and shampoo out of your eyes.   Do not bend or lift heavy objects. Bending increases pressure in the eye. You can walk, climb stairs, and do light household chores.   Do not put a contact lens into the eye that had surgery until your caregiver says it is okay to do so.    Ask your doctor when you can return to work. This will depend on the kind of work that you do. If you work in a dusty environment, you may be advised to wear protective eyewear for a period of time.   Ask your caregiver when it will be safe to engage in sexual activity.   Continue with your regular eye exams as directed by your caregiver.  What to expect:  It is normal to feel itching and mild discomfort for a few days after cataract surgery. Some fluid discharge is also common, and your eye may be sensitive to light and touch.   After 1 to 2 days, even moderate discomfort should disappear. In most cases, healing will take about 6 weeks.   If you received an intraocular lens (IOL), you may notice that colors are very bright or have a blue tinge. Also, if you have been in bright sunlight,  everything may appear reddish for a few hours. If you see these color tinges, it is because your lens is clear and no longer cloudy. Within a few months after receiving an IOL, these extra colors should go away. When you have healed, you will probably need new glasses.  SEEK MEDICAL CARE IF:   You have increased bruising around your eye.   You have discomfort not helped by medicine.  SEEK IMMEDIATE MEDICAL CARE IF:   You have a fever.   You have a worsening or sudden vision loss.   You have redness, swelling, or increasing pain in the eye.   You have a thick discharge from the eye that had surgery.  MAKE SURE YOU:  Understand these instructions.   Will watch your condition.   Will get help right away if you are not doing well or get worse.  Document Released: 08/07/2004 Document Revised: 01/07/2011 Document Reviewed: 09/11/2010 [facility] Patient Information 2012 [place].

## 2012-04-12 NOTE — Pre-Procedure Instructions (Signed)
Dr Jayme Cloud shown labs. Patient needs to be worked up before surgery done. Dr Dimas Aguas medical MD also notified. Dr Lita Mains notified as well, will keep patient on schedule for now until he contacts Dr Dimas Aguas about pt's plan of care.

## 2012-04-13 NOTE — Progress Notes (Signed)
Phone call from Dr Lita Mains' office.  Per Dr Lita Mains, pt needs STAT potassium level upon arrival on day of surgery (04/17/12) for cataract removal.  Patient's K+ 5.8 on 04/11/12.  Notation made on front of chart for this to be done.

## 2012-04-14 ENCOUNTER — Encounter (HOSPITAL_COMMUNITY): Payer: Self-pay | Admitting: Pharmacy Technician

## 2012-04-17 ENCOUNTER — Ambulatory Visit (HOSPITAL_COMMUNITY): Payer: Medicare Other | Admitting: Anesthesiology

## 2012-04-17 ENCOUNTER — Encounter (HOSPITAL_COMMUNITY): Payer: Self-pay | Admitting: *Deleted

## 2012-04-17 ENCOUNTER — Ambulatory Visit (HOSPITAL_COMMUNITY)
Admission: RE | Admit: 2012-04-17 | Discharge: 2012-04-17 | Disposition: A | Discharge status: Home Health | Payer: Medicare Other | Source: Ambulatory Visit | Attending: Ophthalmology | Admitting: Ophthalmology

## 2012-04-17 ENCOUNTER — Encounter (HOSPITAL_COMMUNITY)
Admission: RE | Disposition: A | Discharge status: Home Health | Payer: Self-pay | Source: Ambulatory Visit | Attending: Ophthalmology

## 2012-04-17 ENCOUNTER — Encounter (HOSPITAL_COMMUNITY): Payer: Self-pay | Admitting: Anesthesiology

## 2012-04-17 DIAGNOSIS — H2510 Age-related nuclear cataract, unspecified eye: Secondary | ICD-10-CM | POA: Insufficient documentation

## 2012-04-17 DIAGNOSIS — I10 Essential (primary) hypertension: Secondary | ICD-10-CM | POA: Insufficient documentation

## 2012-04-17 DIAGNOSIS — Z01812 Encounter for preprocedural laboratory examination: Secondary | ICD-10-CM | POA: Insufficient documentation

## 2012-04-17 DIAGNOSIS — E119 Type 2 diabetes mellitus without complications: Secondary | ICD-10-CM | POA: Insufficient documentation

## 2012-04-17 HISTORY — PX: CATARACT EXTRACTION W/PHACO: SHX586

## 2012-04-17 SURGERY — PHACOEMULSIFICATION, CATARACT, WITH IOL INSERTION
Anesthesia: Monitor Anesthesia Care | Laterality: Left | Wound class: Clean

## 2012-04-17 MED ORDER — GATIFLOXACIN 0.5 % OP SOLN OPTIME - NO CHARGE
1.0000 [drp] | Freq: Once | OPHTHALMIC | Status: AC
  Administered 2012-04-17: 1 [drp] via OPHTHALMIC
  Filled 2012-04-17: qty 2.5

## 2012-04-17 MED ORDER — EPINEPHRINE HCL 1 MG/ML IJ SOLN
INTRAOCULAR | Status: DC | PRN
  Administered 2012-04-17: 09:00:00

## 2012-04-17 MED ORDER — TETRACAINE 0.5 % OP SOLN OPTIME - NO CHARGE
OPHTHALMIC | Status: DC | PRN
  Administered 2012-04-17: 2 [drp] via OPHTHALMIC

## 2012-04-17 MED ORDER — BSS IO SOLN
INTRAOCULAR | Status: DC | PRN
  Administered 2012-04-17: 15 mL via INTRAOCULAR

## 2012-04-17 MED ORDER — DEXTROSE 50 % IV SOLN
12.5000 g | Freq: Once | INTRAVENOUS | Status: AC
  Administered 2012-04-17: 12.5 g via INTRAVENOUS

## 2012-04-17 MED ORDER — MIDAZOLAM HCL 2 MG/2ML IJ SOLN
1.0000 mg | INTRAMUSCULAR | Status: DC | PRN
  Administered 2012-04-17: 2 mg via INTRAVENOUS

## 2012-04-17 MED ORDER — MIDAZOLAM HCL 2 MG/2ML IJ SOLN
INTRAMUSCULAR | Status: AC
  Filled 2012-04-17: qty 2

## 2012-04-17 MED ORDER — MOXIFLOXACIN HCL 0.5 % OP SOLN - NO CHARGE
1.0000 [drp] | Freq: Once | OPHTHALMIC | Status: DC
  Filled 2012-04-17: qty 3

## 2012-04-17 MED ORDER — LIDOCAINE 3.5 % OP GEL OPTIME - NO CHARGE
OPHTHALMIC | Status: DC | PRN
  Administered 2012-04-17: 2 [drp] via OPHTHALMIC

## 2012-04-17 MED ORDER — CYCLOPENTOLATE-PHENYLEPHRINE 0.2-1 % OP SOLN
1.0000 [drp] | Freq: Once | OPHTHALMIC | Status: AC
  Administered 2012-04-17: 1 [drp] via OPHTHALMIC

## 2012-04-17 MED ORDER — NA HYALUR & NA CHOND-NA HYALUR 0.55-0.5 ML IO KIT
PACK | INTRAOCULAR | Status: DC | PRN
  Administered 2012-04-17: 1 via OPHTHALMIC

## 2012-04-17 MED ORDER — GATIFLOXACIN 0.5 % OP SOLN OPTIME - NO CHARGE
OPHTHALMIC | Status: DC | PRN
  Administered 2012-04-17: 2 [drp] via OPHTHALMIC

## 2012-04-17 MED ORDER — DEXTROSE 50 % IV SOLN
INTRAVENOUS | Status: AC
  Filled 2012-04-17: qty 50

## 2012-04-17 MED ORDER — EPINEPHRINE HCL 1 MG/ML IJ SOLN
INTRAMUSCULAR | Status: AC
  Filled 2012-04-17: qty 1

## 2012-04-17 MED ORDER — LACTATED RINGERS IV SOLN
INTRAVENOUS | Status: DC
  Administered 2012-04-17: 09:00:00 via INTRAVENOUS

## 2012-04-17 MED ORDER — KETOROLAC TROMETHAMINE 0.4 % OP SOLN - NO CHARGE
1.0000 [drp] | Freq: Once | OPHTHALMIC | Status: AC
  Administered 2012-04-17: 1 [drp] via OPHTHALMIC
  Filled 2012-04-17: qty 5

## 2012-04-17 SURGICAL SUPPLY — 7 items
CLOTH BEACON ORANGE TIMEOUT ST (SAFETY) ×1 IMPLANT
GLOVE BIOGEL PI IND STRL 7.0 (GLOVE) IMPLANT
GLOVE BIOGEL PI INDICATOR 7.0 (GLOVE) ×3
INST SET CATARACT ~~LOC~~ (KITS) ×2 IMPLANT
PAD ARMBOARD 7.5X6 YLW CONV (MISCELLANEOUS) ×1 IMPLANT
SIGHTPATH CAT PROC W REG LENS (Ophthalmic Related) ×2 IMPLANT
WATER STERILE IRR 250ML POUR (IV SOLUTION) ×1 IMPLANT

## 2012-04-17 NOTE — Anesthesia Postprocedure Evaluation (Signed)
  Anesthesia Post-op Note  Patient: Corey Riley  Procedure(s) Performed: Procedure(s) with comments: CATARACT EXTRACTION PHACO AND INTRAOCULAR LENS PLACEMENT (IOC) (Left) - CDE=20.75  Patient Location: Short Stay  Anesthesia Type:MAC  Level of Consciousness: awake, alert , oriented and patient cooperative  Airway and Oxygen Therapy: Patient Spontanous Breathing  Post-op Pain: none  Post-op Assessment: Post-op Vital signs reviewed, Patient's Cardiovascular Status Stable, Respiratory Function Stable, Patent Airway, No signs of Nausea or vomiting and Pain level controlled  Post-op Vital Signs: Reviewed and stable  Complications: No apparent anesthesia complications

## 2012-04-17 NOTE — Transfer of Care (Signed)
Immediate Anesthesia Transfer of Care Note  Patient: Corey Riley  Procedure(s) Performed: Procedure(s) with comments: CATARACT EXTRACTION PHACO AND INTRAOCULAR LENS PLACEMENT (IOC) (Left) - CDE=20.75  Patient Location: Short Stay  Anesthesia Type:MAC  Level of Consciousness: awake, alert , oriented and patient cooperative  Airway & Oxygen Therapy: Patient Spontanous Breathing  Post-op Assessment: Report given to PACU RN, Post -op Vital signs reviewed and stable and Patient moving all extremities  Post vital signs: Reviewed and stable  Complications: No apparent anesthesia complications

## 2012-04-17 NOTE — Anesthesia Preprocedure Evaluation (Signed)
Anesthesia Evaluation  Patient identified by MRN, date of birth, ID band Patient awake    Reviewed: Allergy & Precautions, H&P , NPO status , Patient's Chart, lab work & pertinent test results  Airway Mallampati: II TM Distance: >3 FB     Dental  (+) Teeth Intact   Pulmonary former smoker,  breath sounds clear to auscultation        Cardiovascular hypertension, Pt. on medications - angina+ CAD and + Cardiac Stents Rhythm:Regular Rate:Normal     Neuro/Psych    GI/Hepatic   Endo/Other  diabetes, Poorly Controlled, Type 2Hypothyroidism   Renal/GU Renal disease     Musculoskeletal   Abdominal   Peds  Hematology   Anesthesia Other Findings   Reproductive/Obstetrics                           Anesthesia Physical Anesthesia Plan  ASA: III  Anesthesia Plan: MAC   Post-op Pain Management:    Induction: Intravenous  Airway Management Planned: Nasal Cannula  Additional Equipment:   Intra-op Plan:   Post-operative Plan:   Informed Consent: I have reviewed the patients History and Physical, chart, labs and discussed the procedure including the risks, benefits and alternatives for the proposed anesthesia with the patient or authorized representative who has indicated his/her understanding and acceptance.     Plan Discussed with:   Anesthesia Plan Comments:         Anesthesia Quick Evaluation

## 2012-04-17 NOTE — Brief Op Note (Signed)
04/17/2012  9:34 AM  PATIENT:  Corey Riley  77 y.o. male  PRE-OPERATIVE DIAGNOSIS:  nuclear cataract left eye; decreased visual acuity affecting daily activities  POST-OPERATIVE DIAGNOSIS:  nuclear cataract left eye; decreased visual acuity affecting daily activities  PROCEDURE:  Procedure(s): CATARACT EXTRACTION PHACO AND INTRAOCULAR LENS PLACEMENT (IOC)  SURGEON:  Surgeon(s): Susa Simmonds, MD  ASSISTANTS: Fernande Boyden, CST   ANESTHESIA STAFF: Anesthesiologist: Laurene Footman, MD CRNA: Despina Hidden, CRNA  ANESTHESIA:   topical and MAC  REQUESTED LENS POWER: 24.5  LENS IMPLANT INFORMATION:  Alcon [id number] B/J:[id number] exp 09/2014  CUMULATIVE DISSIPATED ENERGY:20.75  INDICATIONS:PT UNABLE TO READ AND DRIVE ADEQUATELY WITH BEST CORRECTION  OP FINDINGS:Dense NS and heavy PSC  COMPLICATIONS:None  DICTATION #: see scanned op note done today  PLAN OF CARE: KPE w IOL today  PATIENT DISPOSITION:  Short Stay

## 2012-04-17 NOTE — H&P (Signed)
I have reviewed the pre printed H&P, the patient was re-examined, and I have identified no significant interval changes in the patient's medical condition.  There is no change in the plan of care since the history and physical of record. 

## 2012-04-17 NOTE — Op Note (Signed)
See scanned note.

## 2012-04-18 LAB — POCT I-STAT 4, (NA,K, GLUC, HGB,HCT)
HCT: 30 % — ABNORMAL LOW (ref 39.0–52.0)
Hemoglobin: 10.2 g/dL — ABNORMAL LOW (ref 13.0–17.0)
Potassium: 3.9 mEq/L (ref 3.5–5.1)
Sodium: 146 mEq/L — ABNORMAL HIGH (ref 135–145)

## 2012-04-19 ENCOUNTER — Encounter (HOSPITAL_COMMUNITY): Payer: Self-pay | Admitting: Ophthalmology

## 2012-07-14 ENCOUNTER — Ambulatory Visit (INDEPENDENT_AMBULATORY_CARE_PROVIDER_SITE_OTHER): Payer: Medicare Other | Admitting: Urology

## 2012-07-14 DIAGNOSIS — N401 Enlarged prostate with lower urinary tract symptoms: Secondary | ICD-10-CM

## 2012-07-14 DIAGNOSIS — R972 Elevated prostate specific antigen [PSA]: Secondary | ICD-10-CM

## 2013-02-20 ENCOUNTER — Ambulatory Visit (INDEPENDENT_AMBULATORY_CARE_PROVIDER_SITE_OTHER): Payer: Medicare Other | Admitting: Cardiovascular Disease

## 2013-02-20 ENCOUNTER — Encounter (INDEPENDENT_AMBULATORY_CARE_PROVIDER_SITE_OTHER): Payer: Self-pay

## 2013-02-20 VITALS — BP 157/84 | HR 62 | Ht 70.0 in | Wt 170.0 lb

## 2013-02-20 DIAGNOSIS — E119 Type 2 diabetes mellitus without complications: Secondary | ICD-10-CM

## 2013-02-20 DIAGNOSIS — N184 Chronic kidney disease, stage 4 (severe): Secondary | ICD-10-CM

## 2013-02-20 DIAGNOSIS — I350 Nonrheumatic aortic (valve) stenosis: Secondary | ICD-10-CM

## 2013-02-20 DIAGNOSIS — I2510 Atherosclerotic heart disease of native coronary artery without angina pectoris: Secondary | ICD-10-CM

## 2013-02-20 DIAGNOSIS — E1122 Type 2 diabetes mellitus with diabetic chronic kidney disease: Secondary | ICD-10-CM

## 2013-02-20 DIAGNOSIS — E1129 Type 2 diabetes mellitus with other diabetic kidney complication: Secondary | ICD-10-CM

## 2013-02-20 DIAGNOSIS — I10 Essential (primary) hypertension: Secondary | ICD-10-CM

## 2013-02-20 DIAGNOSIS — I359 Nonrheumatic aortic valve disorder, unspecified: Secondary | ICD-10-CM

## 2013-02-20 DIAGNOSIS — E785 Hyperlipidemia, unspecified: Secondary | ICD-10-CM

## 2013-02-20 MED ORDER — LISINOPRIL 20 MG PO TABS: 20.0000 mg | ORAL_TABLET | Freq: Two times a day (BID) | ORAL | Status: DC

## 2013-02-20 NOTE — Patient Instructions (Signed)
Your physician recommends that you schedule a follow-up appointment in: 6 months. You will receive a reminder letter in the mail in about 4 months reminding you to call and schedule your appointment. If you don't receive this letter, please contact our office. Your physician has recommended you make the following change in your medication: Increase your lisinopril 20 mg to twice daily. Your new prescription has been sent to your pharmacy. All other medications will remain the same.

## 2013-02-20 NOTE — Progress Notes (Signed)
Patient ID: Corey Riley, male   DOB: 1935-05-13, 78 y.o.   MRN: [id number]       CARDIOLOGY CONSULT NOTE  Patient ID: Corey Riley MRN: [id number] DOB/AGE: 09-08-1935 78 y.o.  Admit date: (Not on file) Primary Physician Rory Percy, MD  Reason for Consultation: CAD, aortic stenosis  HPI: The patient is a 78 year old male who I am evaluating for the first time. It appears he was most recently evaluated in our cardiology office in 2008. He has a history of coronary artery disease status post drug eluting stent  placement to the left anterior descending in August 2003. He also hypertension, hyperlipidemia, hypothyroidism, diabetes mellitus (HbA1C 6.5% on 01/03/2013), CKD, and aortic stenosis. An echocardiogram performed on 02/09/2013 showed normal left ventricular systolic function, EF 66-06%, normal regional wall motion, grade 1 diastolic dysfunction, mild to moderate concentric LVH, moderate to severe left atrial enlargement, mild aortic stenosis with a mean gradient of 7 mm mercury, and trivial aortic regurgitation. An ECG from December 2013 showed normal sinus rhythm with a right bundle branch block and left anterior fascicular block. He currently denies any chest pain, palpitations, lightheadedness, dizziness, orthopnea, paroxysmal nocturnal dyspnea, and syncope. He very seldom gets mildly short of breath. He used to have some mild leg swelling which has been alleviated with taking Lasix 20 mg twice daily. He believes he had a stress test approximately 2 years ago at [facility] and those results are unavailable to me at this time. He has one kidney. His left kidney was removed in 1955 after a high school football injury. He stays active doing yard work. He used to work for [employer] in [place].  His labs from 01/03/2013 showed total cholesterol 145, triglycerides 76, HDL 41, LDL 89, hemoglobin 9.7, BUN 62, creatinine 2.9, with a resultant GFR of 20 mL per  minute.      Allergies  Allergen Reactions  . Codeine Nausea Only  . Sulfa Antibiotics Nausea Only    Current Outpatient Prescriptions  Medication Sig Dispense Refill  . aspirin EC 81 MG tablet Take 81 mg by mouth at bedtime.      . cholecalciferol (VITAMIN D) 1000 UNITS tablet Take 1,000 Units by mouth daily.      Marland Kitchen doxazosin (CARDURA) 4 MG tablet Take 4 mg by mouth 2 (two) times daily.      . insulin glargine (LANTUS SOLOSTAR) 100 UNIT/ML injection Inject 25 Units into the skin at bedtime.       . isosorbide mononitrate (IMDUR) 60 MG 24 hr tablet Take 60 mg by mouth daily.      Marland Kitchen levothyroxine (SYNTHROID, LEVOTHROID) 112 MCG tablet Take 112 mcg by mouth daily.      Marland Kitchen lisinopril (PRINIVIL,ZESTRIL) 20 MG tablet Take 20 mg by mouth at bedtime.      Marland Kitchen lisinopril-hydrochlorothiazide (PRINZIDE,ZESTORETIC) 20-25 MG per tablet Take 1 tablet by mouth daily.      Marland Kitchen NIFEdipine (ADALAT CC) 90 MG 24 hr tablet Take 90 mg by mouth daily.      . simvastatin (ZOCOR) 80 MG tablet Take 80 mg by mouth at bedtime.      . sodium polystyrene (KAYEXALATE) 15 GM/60ML suspension Take 30 g by mouth 2 (two) times daily.       No current facility-administered medications for this visit.   Facility-Administered Medications Ordered in Other Visits  Medication Dose Route Frequency Provider Last Rate Last Dose  . cyclopentolate-phenylephrine (CYCLOMYDRYL) 0.2-1 % ophthalmic solution 1 drop  1 drop Right  Eye Once Williams Che, MD      . ketorolac Nancie Neas) 0.5 % ophthalmic solution 1 drop  1 drop Right Eye Once Williams Che, MD        Past Medical History  Diagnosis Date  . Essential hypertension, benign   . Type 2 diabetes mellitus   . Mixed hyperlipidemia   . Hypothyroidism   . Coronary atherosclerosis of native coronary artery     DES LAD 8/03  . Aortic stenosis     Mild  . Shortness of breath   . Chronic kidney disease     pt states 30% kidney function  . Anemia     Past Surgical History   Procedure Laterality Date  . Rotator cuff repair      left  . Nephrectomy      Left   . Cataract extraction w/phaco  02/07/2012    Procedure: CATARACT EXTRACTION PHACO AND INTRAOCULAR LENS PLACEMENT (IOC);  Surgeon: Williams Che, MD;  Location: [facility];  Service: Ophthalmology;  Laterality: Right;  CDE:24.87  . Cataract extraction w/phaco Left 04/17/2012    Procedure: CATARACT EXTRACTION PHACO AND INTRAOCULAR LENS PLACEMENT (IOC);  Surgeon: Williams Che, MD;  Location: [facility];  Service: Ophthalmology;  Laterality: Left;  CDE=20.75    History   Social History  . Marital Status: Married    Spouse Name: N/A    Number of Children: N/A  . Years of Education: N/A   Occupational History  . Not on file.   Social History Main Topics  . Smoking status: Former Smoker    Types: Cigarettes  . Smokeless tobacco: Not on file  . Alcohol Use: No  . Drug Use: No  . Sexual Activity: Not on file   Other Topics Concern  . Not on file   Social History Narrative  . No narrative on file     No family history on file.   Prior to Admission medications   Medication Sig Start Date End Date Taking? Authorizing Provider  aspirin EC 81 MG tablet Take 81 mg by mouth at bedtime.    Historical Provider, MD  cholecalciferol (VITAMIN D) 1000 UNITS tablet Take 1,000 Units by mouth daily.    Historical Provider, MD  doxazosin (CARDURA) 4 MG tablet Take 4 mg by mouth 2 (two) times daily.    Historical Provider, MD  insulin glargine (LANTUS SOLOSTAR) 100 UNIT/ML injection Inject 25 Units into the skin at bedtime.     Historical Provider, MD  isosorbide mononitrate (IMDUR) 60 MG 24 hr tablet Take 60 mg by mouth daily.    Historical Provider, MD  levothyroxine (SYNTHROID, LEVOTHROID) 112 MCG tablet Take 112 mcg by mouth daily.    Historical Provider, MD  lisinopril (PRINIVIL,ZESTRIL) 20 MG tablet Take 20 mg by mouth at bedtime.    Historical Provider, MD  lisinopril-hydrochlorothiazide  (PRINZIDE,ZESTORETIC) 20-25 MG per tablet Take 1 tablet by mouth daily.    Historical Provider, MD  NIFEdipine (ADALAT CC) 90 MG 24 hr tablet Take 90 mg by mouth daily.    Historical Provider, MD  simvastatin (ZOCOR) 80 MG tablet Take 80 mg by mouth at bedtime.    Historical Provider, MD  sodium polystyrene (KAYEXALATE) 15 GM/60ML suspension Take 30 g by mouth 2 (two) times daily.    Historical Provider, MD     Review of systems complete and found to be negative unless listed above in HPI  BP 157/84 Pulse 62     Physical exam  Height 5\' 10"  (1.778 m), weight 170 lb (77.111 kg). General: NAD Neck: No JVD, no thyromegaly or thyroid nodule.  Lungs: Clear to auscultation bilaterally with normal respiratory effort. CV: Nondisplaced PMI.  Heart regular S1/S2, no S3/S4, no murmur.  No peripheral edema.  No carotid bruit.  Normal pedal pulses.  Abdomen: Soft, nontender, no hepatosplenomegaly, no distention.  Skin: Intact without lesions or rashes.  Neurologic: Alert and oriented x 3.  Psych: Normal affect. Extremities: No clubbing or cyanosis.  HEENT: Normal.   Labs:   Lab Results  Component Value Date   HGB 10.2* 04/17/2012   HCT 30.0* 04/17/2012   No results found for this basename: NA, K, CL, CO2, BUN, CREATININE, CALCIUM, LABALBU, PROT, BILITOT, ALKPHOS, ALT, AST, GLUCOSE,  in the last 168 hours No results found for this basename: CKTOTAL, CKMB, CKMBINDEX, TROPONINI    No results found for this basename: CHOL   No results found for this basename: HDL   No results found for this basename: LDLCALC   No results found for this basename: TRIG   No results found for this basename: CHOLHDL   No results found for this basename: LDLDIRECT         Studies: See HPI  ASSESSMENT AND PLAN:  1. CAD with prior stent placement: Symptomatically stable. Continue aspirin, Imdur, metoprolol, and simvastatin. I will try and obtain the results of stress testing completed approximately 2  years ago. He has normal left ventricular systolic function and grade 1 diastolic dysfunction. 2. HTN: uncontrolled today. Will increase lisinopril to 20 mg bid. 3. Aortic stenosis: mild with a mean gradient of 7 mmHg. Continue to monitor with surveillance echocardiography. 4. Hyperlipidemia: lipids as mentioned above. Continue simvastatin 40 mg daily. 5. Diabetes mellitus: well controlled with HbA1C of 6.5% on 01/03/13.  Dispo: f/u 6 months.  Signed: Kate Sable, M.D., F.A.C.C.  02/20/2013, 1:14 PM

## 2013-03-23 ENCOUNTER — Ambulatory Visit: Payer: Medicare Other | Admitting: Urology

## 2013-05-11 ENCOUNTER — Ambulatory Visit (INDEPENDENT_AMBULATORY_CARE_PROVIDER_SITE_OTHER): Payer: Medicare Other | Admitting: Urology

## 2013-05-11 DIAGNOSIS — R351 Nocturia: Secondary | ICD-10-CM

## 2013-05-11 DIAGNOSIS — N138 Other obstructive and reflux uropathy: Secondary | ICD-10-CM

## 2013-05-11 DIAGNOSIS — R972 Elevated prostate specific antigen [PSA]: Secondary | ICD-10-CM

## 2013-05-11 DIAGNOSIS — N403 Nodular prostate with lower urinary tract symptoms: Secondary | ICD-10-CM

## 2013-06-29 ENCOUNTER — Ambulatory Visit (INDEPENDENT_AMBULATORY_CARE_PROVIDER_SITE_OTHER): Payer: Medicare Other | Admitting: Urology

## 2013-06-29 DIAGNOSIS — N403 Nodular prostate with lower urinary tract symptoms: Secondary | ICD-10-CM

## 2013-06-29 DIAGNOSIS — R972 Elevated prostate specific antigen [PSA]: Secondary | ICD-10-CM

## 2013-06-29 DIAGNOSIS — N138 Other obstructive and reflux uropathy: Secondary | ICD-10-CM

## 2013-08-21 ENCOUNTER — Other Ambulatory Visit: Payer: Self-pay | Admitting: *Deleted

## 2013-08-21 DIAGNOSIS — Z01810 Encounter for preprocedural cardiovascular examination: Secondary | ICD-10-CM

## 2013-08-21 DIAGNOSIS — N184 Chronic kidney disease, stage 4 (severe): Secondary | ICD-10-CM

## 2013-08-27 ENCOUNTER — Ambulatory Visit (INDEPENDENT_AMBULATORY_CARE_PROVIDER_SITE_OTHER): Payer: Medicare Other | Admitting: Cardiovascular Disease

## 2013-08-27 ENCOUNTER — Encounter: Payer: Self-pay | Admitting: Cardiovascular Disease

## 2013-08-27 VITALS — BP 191/88 | HR 52 | Ht 70.0 in | Wt 165.0 lb

## 2013-08-27 DIAGNOSIS — E0959 Drug or chemical induced diabetes mellitus with other circulatory complications: Secondary | ICD-10-CM

## 2013-08-27 DIAGNOSIS — E1351 Other specified diabetes mellitus with diabetic peripheral angiopathy without gangrene: Secondary | ICD-10-CM

## 2013-08-27 DIAGNOSIS — N2889 Other specified disorders of kidney and ureter: Principal | ICD-10-CM

## 2013-08-27 DIAGNOSIS — I150 Renovascular hypertension: Secondary | ICD-10-CM

## 2013-08-27 DIAGNOSIS — E785 Hyperlipidemia, unspecified: Secondary | ICD-10-CM

## 2013-08-27 DIAGNOSIS — I359 Nonrheumatic aortic valve disorder, unspecified: Secondary | ICD-10-CM

## 2013-08-27 DIAGNOSIS — N185 Chronic kidney disease, stage 5: Secondary | ICD-10-CM

## 2013-08-27 DIAGNOSIS — I2510 Atherosclerotic heart disease of native coronary artery without angina pectoris: Secondary | ICD-10-CM

## 2013-08-27 DIAGNOSIS — I350 Nonrheumatic aortic (valve) stenosis: Secondary | ICD-10-CM

## 2013-08-27 DIAGNOSIS — I151 Hypertension secondary to other renal disorders: Secondary | ICD-10-CM

## 2013-08-27 DIAGNOSIS — R9431 Abnormal electrocardiogram [ECG] [EKG]: Secondary | ICD-10-CM

## 2013-08-27 MED ORDER — METOPROLOL SUCCINATE ER 50 MG PO TB24: ORAL_TABLET | ORAL | Status: DC

## 2013-08-27 MED ORDER — HYDRALAZINE HCL 25 MG PO TABS: 25.0000 mg | ORAL_TABLET | Freq: Two times a day (BID) | ORAL | Status: DC

## 2013-08-27 NOTE — Patient Instructions (Addendum)
   Begin Hydralazine 25mg  twice a day  - new sent to pharm  Decrease Toprol XL to 75mg  daily - new sent to pharm  Continue all other medications.   Your physician wants you to follow up in:  4 months.  You will receive a reminder letter in the mail one-two months in advance.  If you don't receive a letter, please call our office to schedule the follow up appointment

## 2013-08-27 NOTE — Progress Notes (Signed)
Patient ID: Corey Riley, male   DOB: November 02, 1935, 78 y.o.   MRN: [id number]      SUBJECTIVE: The patient is here to followup for coronary artery disease, hypertension, hyperlipidemia and aortic stenosis. He tells me that his kidney dysfunction has progressed, and may start dialysis within the next 6 months. He denies chest pain and exertional dyspnea, as well as orthopnea and PND. He has had increasing ankle and foot edema and takes 40 mg of Lasix in the morning and 20 mg in the evening.  In summary, he has a history of coronary artery disease status post drug eluting stent placement to the left anterior descending in August 2003. He also has hypothyroidism, diabetes mellitus (HbA1C 6.5% on 01/03/2013), and CKD.  An echocardiogram performed on 02/09/2013 showed normal left ventricular systolic function, EF 26-37%, normal regional wall motion, grade 1 diastolic dysfunction, mild to moderate concentric LVH, moderate to severe left atrial enlargement, mild aortic stenosis with a mean gradient of 7 mm mercury, and trivial aortic regurgitation.   ECG performed in the office today demonstrates sinus bradycardia, heart rate 52 beats per minute, right bundle branch block and left anterior fascicular block, LVH, and T-wave inversions in V5 and V6 which are new when compared to an ECG performed in December 2013.   Allergies  Allergen Reactions  . Codeine Nausea Only  . Sulfa Antibiotics Nausea Only    Current Outpatient Prescriptions  Medication Sig Dispense Refill  . aspirin EC 81 MG tablet Take 81 mg by mouth at bedtime.      . cholecalciferol (VITAMIN D) 1000 UNITS tablet Take 2,000 Units by mouth daily.       . furosemide (LASIX) 20 MG tablet Take 20 mg by mouth 2 (two) times daily.      . insulin glargine (LANTUS SOLOSTAR) 100 UNIT/ML injection Inject 10 Units into the skin at bedtime.       . isosorbide mononitrate (IMDUR) 60 MG 24 hr tablet Take 60 mg by mouth daily.      Marland Kitchen levothyroxine  (SYNTHROID, LEVOTHROID) 112 MCG tablet Take 112 mcg by mouth daily.      . metoprolol succinate (TOPROL-XL) 100 MG 24 hr tablet Take 100 mg by mouth daily. Take with or immediately following a meal.      . NIFEdipine (ADALAT CC) 90 MG 24 hr tablet Take 90 mg by mouth daily.      . simvastatin (ZOCOR) 80 MG tablet Take 40 mg by mouth at bedtime.        No current facility-administered medications for this visit.   Facility-Administered Medications Ordered in Other Visits  Medication Dose Route Frequency Provider Last Rate Last Dose  . cyclopentolate-phenylephrine (CYCLOMYDRYL) 0.2-1 % ophthalmic solution 1 drop  1 drop Right Eye Once Williams Che, MD      . ketorolac (ACULAR) 0.5 % ophthalmic solution 1 drop  1 drop Right Eye Once Williams Che, MD        Past Medical History  Diagnosis Date  . Essential hypertension, benign   . Type 2 diabetes mellitus   . Mixed hyperlipidemia   . Hypothyroidism   . Coronary atherosclerosis of native coronary artery     DES LAD 8/03  . Aortic stenosis     Mild  . Shortness of breath   . Chronic kidney disease     pt states 30% kidney function  . Anemia     Past Surgical History  Procedure Laterality Date  .  Rotator cuff repair      left  . Nephrectomy      Left   . Cataract extraction w/phaco  02/07/2012    Procedure: CATARACT EXTRACTION PHACO AND INTRAOCULAR LENS PLACEMENT (IOC);  Surgeon: Williams Che, MD;  Location: [facility];  Service: Ophthalmology;  Laterality: Right;  CDE:24.87  . Cataract extraction w/phaco Left 04/17/2012    Procedure: CATARACT EXTRACTION PHACO AND INTRAOCULAR LENS PLACEMENT (IOC);  Surgeon: Williams Che, MD;  Location: [facility];  Service: Ophthalmology;  Laterality: Left;  CDE=20.75    History   Social History  . Marital Status: Married    Spouse Name: N/A    Number of Children: N/A  . Years of Education: N/A   Occupational History  . Not on file.   Social History Main Topics  . Smoking status:  Former Smoker    Types: Cigarettes  . Smokeless tobacco: Not on file  . Alcohol Use: No  . Drug Use: No  . Sexual Activity: Not on file   Other Topics Concern  . Not on file   Social History Narrative  . No narrative on file    BP 191/88  Pulse 52    PHYSICAL EXAM General: NAD Neck: No JVD, no thyromegaly. Lungs: Clear to auscultation bilaterally with normal respiratory effort. CV: Nondisplaced PMI.  Regular rate and rhythm, normal S1/S2, no U2/G2, II/VI systolic murmur along left sternal border. 1+ periankle and dorsal foot edema b/l.  No carotid bruit.  Normal pedal pulses.  Abdomen: Soft, nontender, no hepatosplenomegaly, no distention.  Neurologic: Alert and oriented x 3.  Psych: Normal affect. Extremities: No clubbing or cyanosis.   ECG: reviewed and available in electronic records.    ASSESSMENT AND PLAN: 1. CAD with prior stent placement: Symptomatically stable. Continue aspirin, Imdur, metoprolol (reduce to 75 mg daily given resting HR 52 bpm), and simvastatin. He has normal left ventricular systolic function and grade 1 diastolic dysfunction. New T wave inversions in V5-6, but asymptomatic. No indication for stress testing at this time. 2. HTN: Poorly controlled today. Start hydralazine 25 mg bid. Has one kidney. 3. Aortic stenosis: Mild with a mean gradient of 7 mmHg. Continue to monitor with surveillance echocardiography.  4. Hyperlipidemia: Continue simvastatin 40 mg daily.  5. Diabetes mellitus: Well controlled at last visit with HbA1C of 6.5% on 01/03/13. However, he tells me it was in the 8% range more recently. 6. CKD, stage 5: Will need dialysis soon. Will start hydralazine to control BP. Unable to use ACEI/ARB for this reason.  Dispo: f/u 4 months.   Kate Sable, M.D., F.A.C.C.

## 2013-09-18 ENCOUNTER — Encounter: Payer: Self-pay | Admitting: Vascular Surgery

## 2013-09-19 ENCOUNTER — Ambulatory Visit (HOSPITAL_COMMUNITY)
Admission: RE | Admit: 2013-09-19 | Discharge: 2013-09-19 | Disposition: A | Discharge status: Home / Self Care | Payer: Medicare Other | Source: Ambulatory Visit | Attending: Vascular Surgery | Admitting: Vascular Surgery

## 2013-09-19 ENCOUNTER — Ambulatory Visit (INDEPENDENT_AMBULATORY_CARE_PROVIDER_SITE_OTHER): Payer: Medicare Other | Admitting: Vascular Surgery

## 2013-09-19 ENCOUNTER — Encounter: Payer: Self-pay | Admitting: Vascular Surgery

## 2013-09-19 ENCOUNTER — Ambulatory Visit (INDEPENDENT_AMBULATORY_CARE_PROVIDER_SITE_OTHER)
Admission: RE | Admit: 2013-09-19 | Discharge: 2013-09-19 | Disposition: A | Discharge status: Home / Self Care | Payer: Medicare Other | Source: Ambulatory Visit | Attending: Vascular Surgery | Admitting: Vascular Surgery

## 2013-09-19 VITALS — BP 212/87 | HR 61 | Ht 70.0 in | Wt 163.0 lb

## 2013-09-19 DIAGNOSIS — Z01810 Encounter for preprocedural cardiovascular examination: Secondary | ICD-10-CM

## 2013-09-19 DIAGNOSIS — N184 Chronic kidney disease, stage 4 (severe): Secondary | ICD-10-CM | POA: Diagnosis not present

## 2013-09-19 DIAGNOSIS — N186 End stage renal disease: Secondary | ICD-10-CM

## 2013-09-19 NOTE — Progress Notes (Signed)
Patient ID: Corey Riley, male   DOB: 01/18/1936, 78 y.o.   MRN: [id number]  Reason for Consult: Evaluate for hemodialysis access. Referred by Dr. Lowanda Foster.   Subjective:     HPI:  Corey Riley is a 78 y.o. male With stage IV chronic kidney disease. He was referred for evaluation for hemodialysis access. He is right-handed. He is status post left nephrectomy after a football injury in high school. He has end-stage renal disease secondary to diabetes. He denies any recent uremic symptoms. Specifically he denies nausea, vomiting, fatigue, anorexia, or palpitations.  I have reviewed his records from Dr. Florentina Addison office. He has stage IV chronic kidney disease.  Past Medical History  Diagnosis Date  . Essential hypertension, benign   . Type 2 diabetes mellitus   . Mixed hyperlipidemia   . Hypothyroidism   . Coronary atherosclerosis of native coronary artery     DES LAD 8/03  . Aortic stenosis     Mild  . Shortness of breath   . Chronic kidney disease     pt states 30% kidney function  . Anemia    Family History  Problem Relation Age of Onset  . Cancer Mother   . Hypertension Mother   . Hypertension Father   . Diabetes Daughter    Past Surgical History  Procedure Laterality Date  . Rotator cuff repair      left  . Nephrectomy      Left   . Cataract extraction w/phaco  02/07/2012    Procedure: CATARACT EXTRACTION PHACO AND INTRAOCULAR LENS PLACEMENT (IOC);  Surgeon: Williams Che, MD;  Location: [facility];  Service: Ophthalmology;  Laterality: Right;  CDE:24.87  . Cataract extraction w/phaco Left 04/17/2012    Procedure: CATARACT EXTRACTION PHACO AND INTRAOCULAR LENS PLACEMENT (IOC);  Surgeon: Williams Che, MD;  Location: [facility];  Service: Ophthalmology;  Laterality: Left;  CDE=20.75   Short Social History:  History  Substance Use Topics  . Smoking status: Former Smoker    Types: Cigarettes  . Smokeless tobacco: Not on file  . Alcohol Use: No   Allergies    Allergen Reactions  . Codeine Nausea Only  . Sulfa Antibiotics Nausea Only   Current Outpatient Prescriptions  Medication Sig Dispense Refill  . furosemide (LASIX) 20 MG tablet Take 20 mg by mouth 2 (two) times daily.      . hydrALAZINE (APRESOLINE) 25 MG tablet Take 1 tablet (25 mg total) by mouth 2 (two) times daily.  60 tablet  6  . isosorbide mononitrate (IMDUR) 60 MG 24 hr tablet Take 60 mg by mouth daily.      Marland Kitchen levothyroxine (SYNTHROID, LEVOTHROID) 112 MCG tablet Take 112 mcg by mouth daily.      . metoprolol succinate (TOPROL-XL) 50 MG 24 hr tablet Take 1 1/2 tabs (75mg ) by mouth daily  45 tablet  6  . NIFEdipine (ADALAT CC) 90 MG 24 hr tablet Take 90 mg by mouth daily.      . simvastatin (ZOCOR) 80 MG tablet Take 40 mg by mouth at bedtime.       Marland Kitchen aspirin EC 81 MG tablet Take 81 mg by mouth at bedtime.      . cholecalciferol (VITAMIN D) 1000 UNITS tablet Take 2,000 Units by mouth daily.       . insulin glargine (LANTUS SOLOSTAR) 100 UNIT/ML injection Inject 10 Units into the skin at bedtime.        No current facility-administered medications for  this visit.   Facility-Administered Medications Ordered in Other Visits  Medication Dose Route Frequency Provider Last Rate Last Dose  . cyclopentolate-phenylephrine (CYCLOMYDRYL) 0.2-1 % ophthalmic solution 1 drop  1 drop Right Eye Once Williams Che, MD      . ketorolac (ACULAR) 0.5 % ophthalmic solution 1 drop  1 drop Right Eye Once Williams Che, MD       Review of Systems  Constitutional: Negative for chills and fever.  Eyes: Negative for loss of vision.  Respiratory: Negative for cough and wheezing.  Cardiovascular: Positive for leg swelling. Negative for chest pain, chest tightness, claudication, dyspnea with exertion, orthopnea and palpitations.  GI: Negative for blood in stool and vomiting.  GU: Negative for dysuria and hematuria.  Musculoskeletal: Negative for leg pain, joint pain and myalgias.  Skin: Negative for  rash and wound.  Neurological: Negative for dizziness and speech difficulty.  Hematologic: Negative for bruises/bleeds easily. Psychiatric: Negative for depressed mood.       Objective:  Objective  Filed Vitals:   09/19/13 1529  BP: 212/87  Pulse: 61  Height: 5\' 10"  (1.778 m)  Weight: 163 lb (73.936 kg)  SpO2: 99%   Body mass index is 23.39 kg/(m^2).  Physical Exam  Constitutional: He is oriented to person, place, and time. He appears well-developed and well-nourished.  HENT:  Head: Normocephalic and atraumatic.  Neck: Neck supple. No JVD present. No thyromegaly present.  Cardiovascular: Normal rate, regular rhythm and normal heart sounds.  Exam reveals no friction rub.   No murmur heard. Pulses:      Radial pulses are 2+ on the right side, and 2+ on the left side.  Pulmonary/Chest: Breath sounds normal. He has no wheezes. He has no rales.  Abdominal: Soft. Bowel sounds are normal. There is no tenderness.  Musculoskeletal: Normal range of motion. He exhibits no edema.  Lymphadenopathy:    He has no cervical adenopathy.  Neurological: He is alert and oriented to person, place, and time. He has normal strength. No sensory deficit.  Skin: No lesion and no rash noted.  Psychiatric: He has a normal mood and affect.   Data: I have independently interpreted his arterial Doppler study. He appears to have a high bifurcation  of the brachial artery in both upper extremities. He does have triphasic waveforms in the radial and ulnar positions bilaterally. I have also independently interpreted his vein mapping. He does not appear to have a usable forearm or upper arm cephalic vein on the right. The basilic vein on the right might potentially be usable for a basilic vein transposition. On the left side the forearm and upper arm cephalic vein did not appear to be adequate. The basilic vein appears to be thickened and likewise does not appear to be adequate for a fistula.        Assessment/Plan:    End stage renal disease The patient has stage IV chronic kidney disease. Based on his vein map, the only option I see for a fistula would be possibly a basilic vein transposition on the right. 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 Likewise the vein is somewhat marginal. If he was not a candidate for a basilic vein transposition and he would require placement of an AV graft. We have tentatively scheduled this for 10/23/2013. However it is felt that he will not need dialysis for a while it  might be better to wait until he is closer to needing dialysis given that he may very well require placement of an AV graft. He will discuss this with Dr. Lowanda Foster.I have explained the indications for placement of an AV fistula or AV graft. I've explained that if at all possible we will place an AV fistula.  I have reviewed the risks of placement of an AV fistula including but not limited to: failure of the fistula to mature, need for subsequent interventions, and thrombosis. In addition I have reviewed the potential complications of placement of an AV graft. These risks include, but are not limited to, graft thrombosis, graft infection, wound healing problems, bleeding, arm swelling, and steal syndrome. All the patient's questions were answered and they are agreeable to proceed with surgery.   Angelia Mould MD [facility]

## 2013-09-19 NOTE — Assessment & Plan Note (Signed)
The patient has stage IV chronic kidney disease. Based on his vein map, the only option I see for a fistula would be possibly a basilic vein transposition on the right. 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 Likewise the vein is somewhat marginal. If he was not a candidate for a basilic vein transposition and he would require placement of an AV graft. We have tentatively scheduled this for 10/23/2013. However it is felt that he will not need dialysis for a while it might be better to wait until he is closer to needing dialysis given that he may very well require placement of an AV graft. He will discuss this with Dr. Lowanda Foster.I have explained the indications for placement of an AV fistula or AV graft. I've explained that if at all possible we will place an AV fistula.  I have reviewed the risks of placement of an AV fistula including but not limited to: failure of the fistula to mature, need for subsequent interventions, and thrombosis. In addition I have reviewed the potential complications of placement of an AV graft. These risks include, but are not limited to, graft thrombosis, graft infection, wound healing problems, bleeding, arm swelling, and steal syndrome. All the patient's questions were answered and they are agreeable to proceed with surgery.

## 2013-09-20 ENCOUNTER — Other Ambulatory Visit: Payer: Self-pay

## 2013-09-20 ENCOUNTER — Encounter: Payer: Self-pay | Admitting: Vascular Surgery

## 2013-09-28 ENCOUNTER — Ambulatory Visit (INDEPENDENT_AMBULATORY_CARE_PROVIDER_SITE_OTHER): Payer: Medicare Other | Admitting: Urology

## 2013-09-28 DIAGNOSIS — N138 Other obstructive and reflux uropathy: Secondary | ICD-10-CM

## 2013-09-28 DIAGNOSIS — N403 Nodular prostate with lower urinary tract symptoms: Secondary | ICD-10-CM

## 2013-09-28 DIAGNOSIS — R972 Elevated prostate specific antigen [PSA]: Secondary | ICD-10-CM

## 2013-10-09 ENCOUNTER — Telehealth: Payer: Self-pay

## 2013-10-09 NOTE — Telephone Encounter (Signed)
Rec'd phone call from Dr. Florentina Addison office.  Requested to cancel pt's surgery on 9/22 for Right Arm BVT vs Insertion AVG.  Stated the pt. has changed his mind, and is not going to start Hemodialysis, but instead he will go on Peritoneal Dialysis.  Informed the nurse that case will be cancelled.

## 2013-10-23 ENCOUNTER — Encounter (HOSPITAL_COMMUNITY): Payer: Self-pay

## 2013-10-23 ENCOUNTER — Ambulatory Visit (HOSPITAL_COMMUNITY): Admit: 2013-10-23 | Payer: Medicare Other | Admitting: Vascular Surgery

## 2013-10-23 SURGERY — TRANSPOSITION, VEIN, BASILIC
Anesthesia: Monitor Anesthesia Care | Laterality: Right

## 2013-11-27 ENCOUNTER — Encounter: Payer: Self-pay | Admitting: Cardiovascular Disease

## 2013-12-14 ENCOUNTER — Ambulatory Visit (INDEPENDENT_AMBULATORY_CARE_PROVIDER_SITE_OTHER): Payer: Medicare Other | Admitting: Cardiovascular Disease

## 2013-12-14 ENCOUNTER — Encounter: Payer: Self-pay | Admitting: Cardiovascular Disease

## 2013-12-14 VITALS — BP 143/62 | HR 62 | Ht 70.0 in | Wt 167.0 lb

## 2013-12-14 DIAGNOSIS — E785 Hyperlipidemia, unspecified: Secondary | ICD-10-CM

## 2013-12-14 DIAGNOSIS — N184 Chronic kidney disease, stage 4 (severe): Secondary | ICD-10-CM

## 2013-12-14 DIAGNOSIS — I350 Nonrheumatic aortic (valve) stenosis: Secondary | ICD-10-CM

## 2013-12-14 DIAGNOSIS — I2510 Atherosclerotic heart disease of native coronary artery without angina pectoris: Secondary | ICD-10-CM

## 2013-12-14 DIAGNOSIS — R9431 Abnormal electrocardiogram [ECG] [EKG]: Secondary | ICD-10-CM

## 2013-12-14 DIAGNOSIS — N185 Chronic kidney disease, stage 5: Secondary | ICD-10-CM

## 2013-12-14 DIAGNOSIS — R0989 Other specified symptoms and signs involving the circulatory and respiratory systems: Secondary | ICD-10-CM

## 2013-12-14 DIAGNOSIS — N2889 Other specified disorders of kidney and ureter: Secondary | ICD-10-CM

## 2013-12-14 DIAGNOSIS — I151 Hypertension secondary to other renal disorders: Secondary | ICD-10-CM

## 2013-12-14 DIAGNOSIS — E1122 Type 2 diabetes mellitus with diabetic chronic kidney disease: Secondary | ICD-10-CM

## 2013-12-14 NOTE — Patient Instructions (Signed)
Your physician has requested that you have a carotid duplex. This test is an ultrasound of the carotid arteries in your neck. It looks at blood flow through these arteries that supply the brain with blood. Allow one hour for this exam. There are no restrictions or special instructions. Office will contact with results via phone or letter.   Continue all current medications. Follow up in  4 months

## 2013-12-14 NOTE — Progress Notes (Signed)
Patient ID: Corey Riley, male   DOB: 12/17/1935, 78 y.o.   MRN: [id number]      SUBJECTIVE: The patient is here to followup for coronary artery disease, hypertension, hyperlipidemia and aortic stenosis. He also has CKD stage 5 and is considering peritoneal dialysis, but a decision will reportedly be made in January. He denies chest pain and exertional dyspnea, as well as orthopnea and PND. His energy levels have progressively declined over the past 1-2 years. Dr. Lowanda Foster (nephrology) has adjusted his antihypertensive medications and his hydralazine is now prescribed at 100 mg every morning. He likes working out in the garden with his wife.  In summary, he has a history of coronary artery disease status post drug eluting stent placement to the left anterior descending in August 2003. He also has hypothyroidism and diabetes mellitus (HbA1C 6.5% on 01/03/2013).  An echocardiogram performed on 02/09/2013 showed normal left ventricular systolic function, EF 08-14%, normal regional wall motion, grade 1 diastolic dysfunction, mild to moderate concentric LVH, moderate to severe left atrial enlargement, mild aortic stenosis with a mean gradient of 7 mm mercury, and trivial aortic regurgitation.   Labs on 11/27/2013 demonstrated BUN 71, creatinine 4.52, GFR 13 mL/min. Hgb 9.4.   Review of Systems: As per "subjective", otherwise negative.  Allergies  Allergen Reactions  . Codeine Nausea Only  . Sulfa Antibiotics Nausea Only    Current Outpatient Prescriptions  Medication Sig Dispense Refill  . aspirin EC 81 MG tablet Take 81 mg by mouth at bedtime.    . cholecalciferol (VITAMIN D) 1000 UNITS tablet Take 2,000 Units by mouth daily.     . furosemide (LASIX) 20 MG tablet Take 20 mg by mouth 2 (two) times daily.    . hydrALAZINE (APRESOLINE) 100 MG tablet Take 100 mg by mouth every morning.     . insulin glargine (LANTUS SOLOSTAR) 100 UNIT/ML injection Inject 10 Units into the skin at bedtime.       . iron polysaccharides (NIFEREX) 150 MG capsule Take 150 mg by mouth 2 (two) times daily.    . isosorbide mononitrate (IMDUR) 60 MG 24 hr tablet Take 60 mg by mouth daily.    Marland Kitchen levothyroxine (SYNTHROID, LEVOTHROID) 112 MCG tablet Take 112 mcg by mouth daily.    . metoprolol succinate (TOPROL-XL) 50 MG 24 hr tablet Take 1 1/2 tabs (75mg ) by mouth daily 45 tablet 6  . NIFEdipine (ADALAT CC) 90 MG 24 hr tablet Take 90 mg by mouth daily.    . simvastatin (ZOCOR) 80 MG tablet Take 40 mg by mouth at bedtime.      No current facility-administered medications for this visit.   Facility-Administered Medications Ordered in Other Visits  Medication Dose Route Frequency Provider Last Rate Last Dose  . cyclopentolate-phenylephrine (CYCLOMYDRYL) 0.2-1 % ophthalmic solution 1 drop  1 drop Right Eye Once Williams Che, MD      . ketorolac (ACULAR) 0.5 % ophthalmic solution 1 drop  1 drop Right Eye Once Williams Che, MD        Past Medical History  Diagnosis Date  . Essential hypertension, benign   . Type 2 diabetes mellitus   . Mixed hyperlipidemia   . Hypothyroidism   . Coronary atherosclerosis of native coronary artery     DES LAD 8/03  . Aortic stenosis     Mild  . Shortness of breath   . Chronic kidney disease     pt states 30% kidney function  . Anemia  Past Surgical History  Procedure Laterality Date  . Rotator cuff repair      left  . Nephrectomy      Left   . Cataract extraction w/phaco  02/07/2012    Procedure: CATARACT EXTRACTION PHACO AND INTRAOCULAR LENS PLACEMENT (IOC);  Surgeon: Williams Che, MD;  Location: [facility];  Service: Ophthalmology;  Laterality: Right;  CDE:24.87  . Cataract extraction w/phaco Left 04/17/2012    Procedure: CATARACT EXTRACTION PHACO AND INTRAOCULAR LENS PLACEMENT (IOC);  Surgeon: Williams Che, MD;  Location: [facility];  Service: Ophthalmology;  Laterality: Left;  CDE=20.75    History   Social History  . Marital Status: Married     Spouse Name: N/A    Number of Children: N/A  . Years of Education: N/A   Occupational History  . Not on file.   Social History Main Topics  . Smoking status: Former Smoker -- 1.50 packs/day    Types: Cigarettes    Start date: 08/14/1955    Quit date: 12/15/1966  . Smokeless tobacco: Never Used  . Alcohol Use: No  . Drug Use: No  . Sexual Activity: Not on file   Other Topics Concern  . Not on file   Social History Narrative     Filed Vitals:   12/14/13 1129  BP: 143/62  Pulse: 62  Height: 5\' 10"  (1.778 m)  Weight: 167 lb (75.751 kg)  SpO2: 99%    PHYSICAL EXAM General: NAD Neck: No JVD, no thyromegaly. Lungs: Clear to auscultation bilaterally with normal respiratory effort. CV: Nondisplaced PMI. Regular rate and rhythm, normal S1/S2, no A1/P3, II/VI systolic murmur along left sternal border. Trace periankle and dorsal foot edema b/l. Left carotid bruit. Abdomen: Soft, nontender, no hepatosplenomegaly, no distention.  Neurologic: Alert and oriented x 3.  Psych: Normal affect. Skin: Normal. Musculoskeletal: Normal range of motion, no gross deformities. Extremities: No clubbing or cyanosis.   ECG: Most recent ECG reviewed.    ASSESSMENT AND PLAN: 1. CAD with prior stent placement: Symptomatically stable. Continue aspirin, Imdur, metoprolol, and simvastatin. He has normal left ventricular systolic function and grade 1 diastolic dysfunction. New T wave inversions in V5-6 noted at last visit, but asymptomatic. No indication for stress testing at this time. 2. Essential HTN: Better controlled today. Continue hydralazine and nifedipine. Has one kidney. 3. Aortic stenosis: Mild with a mean gradient of 7 mmHg. Continue to monitor with surveillance echocardiography.  4. Hyperlipidemia: Continue simvastatin 40 mg daily.  5. Diabetes mellitus with diabetic nephropathy: Mildly uncontrolled at last visit. 6. CKD, stage 5: Considering peritoneal dialysis. Will continue  hydralazine and nifedipine to control BP. Unable to use ACEI/ARB for this reason. 7. Carotid bruit: Will obtain Dopplers to evaluate for stenosis.  Dispo: f/u 4 months.   Kate Sable, M.D., F.A.C.C.

## 2013-12-19 ENCOUNTER — Encounter (INDEPENDENT_AMBULATORY_CARE_PROVIDER_SITE_OTHER): Payer: Medicare Other

## 2013-12-19 DIAGNOSIS — R0989 Other specified symptoms and signs involving the circulatory and respiratory systems: Secondary | ICD-10-CM

## 2013-12-25 ENCOUNTER — Encounter: Payer: Self-pay | Admitting: *Deleted

## 2014-04-12 ENCOUNTER — Encounter: Payer: Self-pay | Admitting: *Deleted

## 2014-04-12 ENCOUNTER — Ambulatory Visit (INDEPENDENT_AMBULATORY_CARE_PROVIDER_SITE_OTHER): Payer: Medicare Other | Admitting: Cardiovascular Disease

## 2014-04-12 ENCOUNTER — Telehealth: Payer: Self-pay | Admitting: Cardiovascular Disease

## 2014-04-12 ENCOUNTER — Encounter: Payer: Self-pay | Admitting: Cardiovascular Disease

## 2014-04-12 VITALS — BP 135/51 | HR 58 | Ht 70.0 in | Wt 164.0 lb

## 2014-04-12 DIAGNOSIS — R9431 Abnormal electrocardiogram [ECG] [EKG]: Secondary | ICD-10-CM

## 2014-04-12 DIAGNOSIS — I25118 Atherosclerotic heart disease of native coronary artery with other forms of angina pectoris: Secondary | ICD-10-CM

## 2014-04-12 DIAGNOSIS — N185 Chronic kidney disease, stage 5: Secondary | ICD-10-CM

## 2014-04-12 DIAGNOSIS — R079 Chest pain, unspecified: Secondary | ICD-10-CM

## 2014-04-12 DIAGNOSIS — E1122 Type 2 diabetes mellitus with diabetic chronic kidney disease: Secondary | ICD-10-CM

## 2014-04-12 DIAGNOSIS — R5383 Other fatigue: Secondary | ICD-10-CM

## 2014-04-12 DIAGNOSIS — I350 Nonrheumatic aortic (valve) stenosis: Secondary | ICD-10-CM

## 2014-04-12 DIAGNOSIS — I6523 Occlusion and stenosis of bilateral carotid arteries: Secondary | ICD-10-CM

## 2014-04-12 DIAGNOSIS — I151 Hypertension secondary to other renal disorders: Secondary | ICD-10-CM

## 2014-04-12 DIAGNOSIS — E785 Hyperlipidemia, unspecified: Secondary | ICD-10-CM

## 2014-04-12 DIAGNOSIS — N2889 Other specified disorders of kidney and ureter: Secondary | ICD-10-CM

## 2014-04-12 DIAGNOSIS — N184 Chronic kidney disease, stage 4 (severe): Secondary | ICD-10-CM

## 2014-04-12 NOTE — Telephone Encounter (Signed)
Lexiscan Cardiolite on medication dx: chest pain, CAD and abnormal ECG  Scheduled at [facility] on March 18 arrive at 8:45

## 2014-04-12 NOTE — Progress Notes (Signed)
Patient ID: Corey Riley, male   DOB: Aug 30, 1935, 79 y.o.   MRN: [id number]      SUBJECTIVE: The patient is here to followup for coronary artery disease, hypertension, hyperlipidemia and aortic stenosis. He also has CKD stage 5 and recently underwent vein mapping with plans to have a fistula constructed for undergoing hemodialysis in the future. He occasionally has right-sided infraaxillary pain but denies exertional dyspnea, as well as orthopnea and PND. His energy levels have progressively declined over the past 1-2 years and he feels markedly fatigued. He follows with Dr. Lowanda Foster (nephrology). He tells me that he has 15% kidney function but labs drawn last week are currently unavailable to me.   In summary, he has a history of coronary artery disease status post drug eluting stent placement to the left anterior descending in August 2003. He also has hypothyroidism and diabetes mellitus.  An echocardiogram performed on 02/09/2013 showed normal left ventricular systolic function, EF 70-78%, normal regional wall motion, grade 1 diastolic dysfunction, mild to moderate concentric LVH, moderate to severe left atrial enlargement, mild aortic stenosis with a mean gradient of 7 mm mercury, and trivial aortic regurgitation.   He and his wife are planning on taking their grandchildren to [place] in August.   Review of Systems: As per "subjective", otherwise negative.  Allergies  Allergen Reactions  . Codeine Nausea Only  . Sulfa Antibiotics Nausea Only    Current Outpatient Prescriptions  Medication Sig Dispense Refill  . aspirin EC 81 MG tablet Take 81 mg by mouth at bedtime.    . cholecalciferol (VITAMIN D) 1000 UNITS tablet Take 2,000 Units by mouth daily.     . furosemide (LASIX) 20 MG tablet Take 20 mg by mouth 2 (two) times daily.    . hydrALAZINE (APRESOLINE) 100 MG tablet Take 100 mg by mouth every morning.     . insulin glargine (LANTUS SOLOSTAR) 100 UNIT/ML injection Inject 15  Units into the skin 2 (two) times daily.     . iron polysaccharides (NIFEREX) 150 MG capsule Take 150 mg by mouth 2 (two) times daily.    . isosorbide mononitrate (IMDUR) 60 MG 24 hr tablet Take 60 mg by mouth daily.    Marland Kitchen levothyroxine (SYNTHROID, LEVOTHROID) 112 MCG tablet Take 112 mcg by mouth daily.    . metoprolol succinate (TOPROL-XL) 50 MG 24 hr tablet Take 1 1/2 tabs (75mg ) by mouth daily 45 tablet 6  . NIFEdipine (ADALAT CC) 90 MG 24 hr tablet Take 90 mg by mouth daily.    . simvastatin (ZOCOR) 80 MG tablet Take 40 mg by mouth at bedtime.      No current facility-administered medications for this visit.   Facility-Administered Medications Ordered in Other Visits  Medication Dose Route Frequency Provider Last Rate Last Dose  . cyclopentolate-phenylephrine (CYCLOMYDRYL) 0.2-1 % ophthalmic solution 1 drop  1 drop Right Eye Once Williams Che, MD      . ketorolac (ACULAR) 0.5 % ophthalmic solution 1 drop  1 drop Right Eye Once Williams Che, MD        Past Medical History  Diagnosis Date  . Essential hypertension, benign   . Type 2 diabetes mellitus   . Mixed hyperlipidemia   . Hypothyroidism   . Coronary atherosclerosis of native coronary artery     DES LAD 8/03  . Aortic stenosis     Mild  . Shortness of breath   . Chronic kidney disease     pt states 30%  kidney function  . Anemia     Past Surgical History  Procedure Laterality Date  . Rotator cuff repair      left  . Nephrectomy      Left   . Cataract extraction w/phaco  02/07/2012    Procedure: CATARACT EXTRACTION PHACO AND INTRAOCULAR LENS PLACEMENT (IOC);  Surgeon: Williams Che, MD;  Location: [facility];  Service: Ophthalmology;  Laterality: Right;  CDE:24.87  . Cataract extraction w/phaco Left 04/17/2012    Procedure: CATARACT EXTRACTION PHACO AND INTRAOCULAR LENS PLACEMENT (IOC);  Surgeon: Williams Che, MD;  Location: [facility];  Service: Ophthalmology;  Laterality: Left;  CDE=20.75    History   Social  History  . Marital Status: Married    Spouse Name: N/A  . Number of Children: N/A  . Years of Education: N/A   Occupational History  . Not on file.   Social History Main Topics  . Smoking status: Former Smoker -- 1.50 packs/day    Types: Cigarettes    Start date: 08/14/1955    Quit date: 12/15/1966  . Smokeless tobacco: Never Used  . Alcohol Use: No  . Drug Use: No  . Sexual Activity: Not on file   Other Topics Concern  . Not on file   Social History Narrative     Filed Vitals:   04/12/14 1253  BP: 135/51  Pulse: 58  Height: 5\' 10"  (1.778 m)  Weight: 164 lb (74.39 kg)    PHYSICAL EXAM General: NAD Neck: No JVD, no thyromegaly. Lungs: Clear to auscultation bilaterally with normal respiratory effort. CV: Nondisplaced PMI. Regular rate and rhythm, normal S1/S2, no X5/M8, II/VI systolic murmur along left sternal border. Trace peri-ankle edema b/l. Left carotid bruit. Abdomen: Soft, nontender, no hepatosplenomegaly, no distention.  Neurologic: Alert and oriented x 3.  Psych: Normal affect. Skin: Normal. Musculoskeletal: Normal range of motion, no gross deformities. Extremities: No clubbing or cyanosis.   ECG: Most recent ECG reviewed.    ASSESSMENT AND PLAN: 1. CAD with prior stent placement with complaints of progressive fatigue: He had new T wave inversions in V5-6 at a prior visit, but was asymptomatic at that time. While symptoms may be attributable to severe CKD stage 5, he may have occult ischemia. I will obtain a Lexiscan Cardiolite stress test for further clarification. Continue aspirin, Imdur, metoprolol, and simvastatin. He has normal left ventricular systolic function and grade 1 diastolic dysfunction.  2. Essential HTN: Well controlled today. Continue hydralazine and nifedipine. Has one kidney. 3. Aortic stenosis: Mild with a mean gradient of 7 mmHg. Continue to monitor with surveillance echocardiography.  4. Hyperlipidemia: Continue simvastatin 40  mg daily. Will obtain copy of lipids from PCP. 5. Diabetes mellitus with diabetic nephropathy: Will obtain copies of labs from PCP. 6. CKD, stage 5: Plans for hemodialysis this year. Will continue hydralazine and nifedipine to control BP. Unable to use ACEI/ARB for this reason. Will obtain labs from nephrologist. 7. Carotid bruit: Mild to moderate plaque disease bilaterally in 12/2013. Will repeat Dopplers in one year.  Dispo: f/u 6 weeks.  Kate Sable, M.D., F.A.C.C.

## 2014-04-12 NOTE — Telephone Encounter (Signed)
[place] EXP 05-11-14

## 2014-04-12 NOTE — Patient Instructions (Signed)
Your physician recommends that you schedule a follow-up appointment in: 6 weeks. Your physician recommends that you continue on your current medications as directed. Please refer to the Current Medication list given to you today. Your physician has requested that you have a lexiscan myoview. For further information please visit [email or web address]. Please follow instruction sheet, as given.

## 2014-04-14 ENCOUNTER — Other Ambulatory Visit: Payer: Self-pay | Admitting: Cardiovascular Disease

## 2014-04-18 ENCOUNTER — Other Ambulatory Visit: Payer: Self-pay

## 2014-04-19 ENCOUNTER — Encounter (HOSPITAL_COMMUNITY)
Admission: RE | Admit: 2014-04-19 | Discharge: 2014-04-19 | Disposition: A | Discharge status: Home / Self Care | Payer: Medicare Other | Source: Ambulatory Visit | Attending: Cardiovascular Disease | Admitting: Cardiovascular Disease

## 2014-04-19 ENCOUNTER — Encounter (HOSPITAL_COMMUNITY): Payer: Self-pay

## 2014-04-19 ENCOUNTER — Ambulatory Visit (HOSPITAL_COMMUNITY)
Admission: RE | Admit: 2014-04-19 | Discharge: 2014-04-19 | Disposition: A | Discharge status: Home / Self Care | Payer: Medicare Other | Source: Ambulatory Visit | Attending: Cardiovascular Disease | Admitting: Cardiovascular Disease

## 2014-04-19 ENCOUNTER — Encounter (HOSPITAL_COMMUNITY): Payer: Self-pay | Admitting: *Deleted

## 2014-04-19 DIAGNOSIS — I2510 Atherosclerotic heart disease of native coronary artery without angina pectoris: Secondary | ICD-10-CM | POA: Diagnosis not present

## 2014-04-19 DIAGNOSIS — I25118 Atherosclerotic heart disease of native coronary artery with other forms of angina pectoris: Secondary | ICD-10-CM | POA: Diagnosis present

## 2014-04-19 DIAGNOSIS — R9431 Abnormal electrocardiogram [ECG] [EKG]: Secondary | ICD-10-CM | POA: Diagnosis present

## 2014-04-19 DIAGNOSIS — R079 Chest pain, unspecified: Secondary | ICD-10-CM | POA: Insufficient documentation

## 2014-04-19 MED ORDER — SODIUM CHLORIDE 0.9 % IJ SOLN
INTRAMUSCULAR | Status: AC
  Administered 2014-04-19: 10 mL via INTRAVENOUS
  Filled 2014-04-19: qty 3

## 2014-04-19 MED ORDER — SODIUM CHLORIDE 0.9 % IJ SOLN
10.0000 mL | INTRAMUSCULAR | Status: DC | PRN
  Administered 2014-04-19: 10 mL via INTRAVENOUS
  Filled 2014-04-19: qty 10

## 2014-04-19 MED ORDER — TECHNETIUM TC 99M SESTAMIBI GENERIC - CARDIOLITE
10.0000 | Freq: Once | INTRAVENOUS | Status: AC | PRN
  Administered 2014-04-19: 10 via INTRAVENOUS

## 2014-04-19 MED ORDER — TECHNETIUM TC 99M SESTAMIBI - CARDIOLITE
30.0000 | Freq: Once | INTRAVENOUS | Status: AC | PRN
  Administered 2014-04-19: 12:00:00 30 via INTRAVENOUS

## 2014-04-19 MED ORDER — REGADENOSON 0.4 MG/5ML IV SOLN
INTRAVENOUS | Status: AC
  Administered 2014-04-19: 0.4 mg via INTRAVENOUS
  Filled 2014-04-19: qty 5

## 2014-04-19 MED ORDER — REGADENOSON 0.4 MG/5ML IV SOLN
0.4000 mg | Freq: Once | INTRAVENOUS | Status: AC | PRN
  Administered 2014-04-19: 0.4 mg via INTRAVENOUS

## 2014-04-19 NOTE — Progress Notes (Signed)
Stress Lab Nurses Notes - [place] 04/19/2014 Reason for doing test: CAD and Chest Pain Type of test: Wille Glaser Nurse performing test: Gerrit Halls, RN Nuclear Medicine Tech: Redmond Baseman Echo Tech: Not Applicable MD performing test: Koneswaran/K.Purcell Nails NP Family MD: Nadara Mustard Test explained and consent signed: Yes.   IV started: Saline lock flushed, No redness or edema and Saline lock started in radiology Symptoms: Nausea Treatment/Intervention: None Reason test stopped: protocol completed After recovery IV was: Discontinued via X-ray tech and No redness or edema Patient to return to Nuc. Med at : 11:45 Patient discharged: Home Patient's Condition upon discharge was: stable Comments: During test BP 111/45 & HR 72.   Recovery BP 135/51 & HR 63 .  Symptoms resolved in recovery. Geanie Cooley T

## 2014-04-19 NOTE — Progress Notes (Signed)
Mr Kawabata stated that Dr's office said that h could have something small like an egg by 0600 on Monday, then NPO.

## 2014-04-21 MED ORDER — DEXTROSE 5 % IV SOLN
1.5000 g | INTRAVENOUS | Status: AC
  Administered 2014-04-22: 1.5 g via INTRAVENOUS
  Filled 2014-04-21: qty 1.5

## 2014-04-22 ENCOUNTER — Ambulatory Visit (HOSPITAL_COMMUNITY)
Admission: RE | Admit: 2014-04-22 | Discharge: 2014-04-22 | Disposition: A | Discharge status: Home / Self Care | Payer: Medicare Other | Source: Ambulatory Visit | Attending: Vascular Surgery | Admitting: Vascular Surgery

## 2014-04-22 ENCOUNTER — Ambulatory Visit (HOSPITAL_COMMUNITY): Payer: Medicare Other | Admitting: Certified Registered"

## 2014-04-22 ENCOUNTER — Encounter (HOSPITAL_COMMUNITY): Payer: Self-pay | Admitting: *Deleted

## 2014-04-22 ENCOUNTER — Telehealth: Payer: Self-pay | Admitting: *Deleted

## 2014-04-22 ENCOUNTER — Encounter (HOSPITAL_COMMUNITY)
Admission: RE | Disposition: A | Discharge status: Home / Self Care | Payer: Self-pay | Source: Ambulatory Visit | Attending: Vascular Surgery

## 2014-04-22 ENCOUNTER — Ambulatory Visit (HOSPITAL_COMMUNITY): Payer: Medicare Other

## 2014-04-22 DIAGNOSIS — Z419 Encounter for procedure for purposes other than remedying health state, unspecified: Secondary | ICD-10-CM

## 2014-04-22 DIAGNOSIS — N186 End stage renal disease: Secondary | ICD-10-CM | POA: Diagnosis not present

## 2014-04-22 DIAGNOSIS — Z7982 Long term (current) use of aspirin: Secondary | ICD-10-CM | POA: Diagnosis not present

## 2014-04-22 DIAGNOSIS — Z87891 Personal history of nicotine dependence: Secondary | ICD-10-CM | POA: Insufficient documentation

## 2014-04-22 DIAGNOSIS — E782 Mixed hyperlipidemia: Secondary | ICD-10-CM | POA: Diagnosis not present

## 2014-04-22 DIAGNOSIS — Z79899 Other long term (current) drug therapy: Secondary | ICD-10-CM | POA: Insufficient documentation

## 2014-04-22 DIAGNOSIS — Z794 Long term (current) use of insulin: Secondary | ICD-10-CM | POA: Diagnosis not present

## 2014-04-22 DIAGNOSIS — I120 Hypertensive chronic kidney disease with stage 5 chronic kidney disease or end stage renal disease: Secondary | ICD-10-CM | POA: Insufficient documentation

## 2014-04-22 DIAGNOSIS — E119 Type 2 diabetes mellitus without complications: Secondary | ICD-10-CM | POA: Insufficient documentation

## 2014-04-22 DIAGNOSIS — E039 Hypothyroidism, unspecified: Secondary | ICD-10-CM | POA: Diagnosis not present

## 2014-04-22 DIAGNOSIS — I2510 Atherosclerotic heart disease of native coronary artery without angina pectoris: Secondary | ICD-10-CM | POA: Insufficient documentation

## 2014-04-22 DIAGNOSIS — D649 Anemia, unspecified: Secondary | ICD-10-CM | POA: Insufficient documentation

## 2014-04-22 DIAGNOSIS — I350 Nonrheumatic aortic (valve) stenosis: Secondary | ICD-10-CM | POA: Diagnosis not present

## 2014-04-22 DIAGNOSIS — N185 Chronic kidney disease, stage 5: Secondary | ICD-10-CM | POA: Diagnosis not present

## 2014-04-22 HISTORY — DX: Cardiac murmur, unspecified: R01.1

## 2014-04-22 HISTORY — PX: INSERTION OF DIALYSIS CATHETER: SHX1324

## 2014-04-22 HISTORY — DX: Angina pectoris, unspecified: I20.9

## 2014-04-22 LAB — POCT I-STAT 4, (NA,K, GLUC, HGB,HCT)
GLUCOSE: 142 mg/dL — AB (ref 70–99)
HCT: 23 % — ABNORMAL LOW (ref 39.0–52.0)
Hemoglobin: 7.8 g/dL — ABNORMAL LOW (ref 13.0–17.0)
POTASSIUM: 4 mmol/L (ref 3.5–5.1)
Sodium: 141 mmol/L (ref 135–145)

## 2014-04-22 LAB — GLUCOSE, CAPILLARY
Glucose-Capillary: 146 mg/dL — ABNORMAL HIGH (ref 70–99)
Glucose-Capillary: 136 mg/dL — ABNORMAL HIGH (ref 70–99)
Glucose-Capillary: 151 mg/dL — ABNORMAL HIGH (ref 70–99)

## 2014-04-22 SURGERY — INSERTION OF DIALYSIS CATHETER
Anesthesia: Monitor Anesthesia Care | Site: Neck | Laterality: Right

## 2014-04-22 MED ORDER — MIDAZOLAM HCL 2 MG/2ML IJ SOLN
INTRAMUSCULAR | Status: AC
  Filled 2014-04-22: qty 2

## 2014-04-22 MED ORDER — HEPARIN SODIUM (PORCINE) 1000 UNIT/ML IJ SOLN
INTRAMUSCULAR | Status: AC
  Filled 2014-04-22: qty 1

## 2014-04-22 MED ORDER — LIDOCAINE HCL (CARDIAC) 20 MG/ML IV SOLN
INTRAVENOUS | Status: DC | PRN
  Administered 2014-04-22: 100 mg via INTRAVENOUS

## 2014-04-22 MED ORDER — SODIUM CHLORIDE 0.9 % IV SOLN
INTRAVENOUS | Status: DC | PRN
  Administered 2014-04-22: 15:00:00 via INTRAVENOUS

## 2014-04-22 MED ORDER — HEPARIN SODIUM (PORCINE) 5000 UNIT/ML IJ SOLN
INTRAMUSCULAR | Status: DC | PRN
  Administered 2014-04-22: 500 mL

## 2014-04-22 MED ORDER — SODIUM CHLORIDE 0.9 % IV SOLN
INTRAVENOUS | Status: DC
  Administered 2014-04-22: 13:00:00 via INTRAVENOUS

## 2014-04-22 MED ORDER — PROPOFOL INFUSION 10 MG/ML OPTIME
INTRAVENOUS | Status: DC | PRN
  Administered 2014-04-22: 100 ug/kg/min via INTRAVENOUS

## 2014-04-22 MED ORDER — FENTANYL CITRATE 0.05 MG/ML IJ SOLN: 25.0000 ug | INTRAMUSCULAR | Status: DC | PRN

## 2014-04-22 MED ORDER — MIDAZOLAM HCL 5 MG/5ML IJ SOLN
INTRAMUSCULAR | Status: DC | PRN
  Administered 2014-04-22: 2 mg via INTRAVENOUS

## 2014-04-22 MED ORDER — LIDOCAINE-EPINEPHRINE 0.5 %-1:200000 IJ SOLN
INTRAMUSCULAR | Status: AC
  Filled 2014-04-22: qty 1

## 2014-04-22 MED ORDER — 0.9 % SODIUM CHLORIDE (POUR BTL) OPTIME
TOPICAL | Status: DC | PRN
  Administered 2014-04-22: 1000 mL

## 2014-04-22 MED ORDER — HEPARIN SODIUM (PORCINE) 1000 UNIT/ML IJ SOLN
INTRAMUSCULAR | Status: DC | PRN
  Administered 2014-04-22: 4.6 [IU]

## 2014-04-22 MED ORDER — LIDOCAINE-EPINEPHRINE 0.5 %-1:200000 IJ SOLN
INTRAMUSCULAR | Status: DC | PRN
  Administered 2014-04-22: 10 mL

## 2014-04-22 MED ORDER — OXYCODONE HCL 5 MG PO TABS: 5.0000 mg | ORAL_TABLET | Freq: Once | ORAL | Status: DC | PRN

## 2014-04-22 MED ORDER — ONDANSETRON HCL 4 MG/2ML IJ SOLN: 4.0000 mg | Freq: Four times a day (QID) | INTRAMUSCULAR | Status: DC | PRN

## 2014-04-22 MED ORDER — OXYCODONE HCL 5 MG/5ML PO SOLN: 5.0000 mg | Freq: Once | ORAL | Status: DC | PRN

## 2014-04-22 MED ORDER — ACETAMINOPHEN 325 MG PO TABS: 325.0000 mg | ORAL_TABLET | Freq: Four times a day (QID) | ORAL | Status: DC | PRN

## 2014-04-22 SURGICAL SUPPLY — 48 items
APL SKNCLS STERI-STRIP NONHPOA (GAUZE/BANDAGES/DRESSINGS) ×1
BAG BANDED W/RUBBER/TAPE 36X54 (MISCELLANEOUS) ×2 IMPLANT
BAG DECANTER FOR FLEXI CONT (MISCELLANEOUS) ×3 IMPLANT
BAG EQP BAND 135X91 W/RBR TAPE (MISCELLANEOUS) ×1
BENZOIN TINCTURE PRP APPL 2/3 (GAUZE/BANDAGES/DRESSINGS) ×2 IMPLANT
BIOPATCH BLUE 3/4IN DISK W/1.5 (GAUZE/BANDAGES/DRESSINGS) ×2 IMPLANT
BIOPATCH RED 1 DISK 7.0 (GAUZE/BANDAGES/DRESSINGS) ×2 IMPLANT
BIOPATCH RED 1IN DISK 7.0MM (GAUZE/BANDAGES/DRESSINGS) ×1
CATH CANNON HEMO 15F 50CM (CATHETERS) IMPLANT
CATH CANNON HEMO 15FR 19 (HEMODIALYSIS SUPPLIES) IMPLANT
CATH CANNON HEMO 15FR 23CM (HEMODIALYSIS SUPPLIES) ×2 IMPLANT
CATH CANNON HEMO 15FR 31CM (HEMODIALYSIS SUPPLIES) IMPLANT
CATH CANNON HEMO 15FR 32 (HEMODIALYSIS SUPPLIES) IMPLANT
CATH CANNON HEMO 15FR 32CM (HEMODIALYSIS SUPPLIES) IMPLANT
CLOSURE WOUND 1/2 X4 (GAUZE/BANDAGES/DRESSINGS) ×1
COVER DOME SNAP 22 D (MISCELLANEOUS) ×2 IMPLANT
COVER PROBE W GEL 5X96 (DRAPES) ×2 IMPLANT
DECANTER SPIKE VIAL GLASS SM (MISCELLANEOUS) ×3 IMPLANT
DRAPE C-ARM 42X72 X-RAY (DRAPES) ×3 IMPLANT
DRAPE CHEST BREAST 15X10 FENES (DRAPES) ×3 IMPLANT
GAUZE SPONGE 2X2 8PLY STRL LF (GAUZE/BANDAGES/DRESSINGS) ×1 IMPLANT
GAUZE SPONGE 4X4 16PLY XRAY LF (GAUZE/BANDAGES/DRESSINGS) ×3 IMPLANT
GLOVE SS BIOGEL STRL SZ 7.5 (GLOVE) ×1 IMPLANT
GLOVE SUPERSENSE BIOGEL SZ 7.5 (GLOVE) ×2
GLOVE SURG SS PI 7.0 STRL IVOR (GLOVE) ×2 IMPLANT
GOWN STRL REUS W/ TWL LRG LVL3 (GOWN DISPOSABLE) ×2 IMPLANT
GOWN STRL REUS W/TWL LRG LVL3 (GOWN DISPOSABLE) ×6
KIT BASIN OR (CUSTOM PROCEDURE TRAY) ×3 IMPLANT
KIT ROOM TURNOVER OR (KITS) ×3 IMPLANT
NDL 18GX1X1/2 (RX/OR ONLY) (NEEDLE) ×1 IMPLANT
NDL HYPO 25GX1X1/2 BEV (NEEDLE) ×1 IMPLANT
NEEDLE 18GX1X1/2 (RX/OR ONLY) (NEEDLE) ×3 IMPLANT
NEEDLE 22X1 1/2 (OR ONLY) (NEEDLE) ×3 IMPLANT
NEEDLE HYPO 25GX1X1/2 BEV (NEEDLE) ×3 IMPLANT
NS IRRIG 1000ML POUR BTL (IV SOLUTION) ×3 IMPLANT
PACK SURGICAL SETUP 50X90 (CUSTOM PROCEDURE TRAY) ×3 IMPLANT
PAD ARMBOARD 7.5X6 YLW CONV (MISCELLANEOUS) ×6 IMPLANT
SOAP 2 % CHG 4 OZ (WOUND CARE) ×3 IMPLANT
SPONGE GAUZE 2X2 STER 10/PKG (GAUZE/BANDAGES/DRESSINGS) ×2
STRIP CLOSURE SKIN 1/2X4 (GAUZE/BANDAGES/DRESSINGS) ×1 IMPLANT
SUT ETHILON 3 0 PS 1 (SUTURE) ×3 IMPLANT
SUT VICRYL 4-0 PS2 18IN ABS (SUTURE) ×3 IMPLANT
SYR 20CC LL (SYRINGE) ×3 IMPLANT
SYR 5ML LL (SYRINGE) ×6 IMPLANT
SYR CONTROL 10ML LL (SYRINGE) ×3 IMPLANT
SYRINGE 10CC LL (SYRINGE) ×3 IMPLANT
TAPE CLOTH SURG 4X10 WHT LF (GAUZE/BANDAGES/DRESSINGS) ×2 IMPLANT
WATER STERILE IRR 1000ML POUR (IV SOLUTION) ×1 IMPLANT

## 2014-04-22 NOTE — H&P (Signed)
Patient name: Corey Riley MRN: [id number] DOB: 1935/08/21 Sex: male   Referred by: Karsten Fells  Reason for referral: ESRD  HISTORY OF PRESENT ILLNESS: The patient presents today for hemodialysis catheter placement. He had been seen in our office 6 months ago. At that time he has chronic renal insufficiency but have not progressed to renal failure. He has had worsening renal function now presents for placement of a tunneled catheter. Ultrasound 6 months ago of his upper extremities revealed no usable veins for fistula in the left arm. Only option was an upper arm basilic vein in the right arm.  Past Medical History  Diagnosis Date  . Essential hypertension, benign   . Type 2 diabetes mellitus   . Mixed hyperlipidemia   . Hypothyroidism   . Coronary atherosclerosis of native coronary artery     DES LAD 8/03  . Aortic stenosis     Mild  . Shortness of breath   . Chronic kidney disease     pt states 30% kidney function  . Anemia   . Anginal pain     "a little."   . Heart murmur     "faint"    Past Surgical History  Procedure Laterality Date  . Rotator cuff repair Left     left  . Nephrectomy      Left   . Cataract extraction w/phaco  02/07/2012    Procedure: CATARACT EXTRACTION PHACO AND INTRAOCULAR LENS PLACEMENT (IOC);  Surgeon: Williams Che, MD;  Location: [facility];  Service: Ophthalmology;  Laterality: Right;  CDE:24.87  . Cataract extraction w/phaco Left 04/17/2012    Procedure: CATARACT EXTRACTION PHACO AND INTRAOCULAR LENS PLACEMENT (IOC);  Surgeon: Williams Che, MD;  Location: [facility];  Service: Ophthalmology;  Laterality: Left;  CDE=20.75  . Jaw implant      History   Social History  . Marital Status: Married    Spouse Name: N/A  . Number of Children: N/A  . Years of Education: N/A   Occupational History  . Not on file.   Social History Main Topics  . Smoking status: Former Smoker -- 1.50 packs/day    Types: Cigarettes    Start date: 08/14/1955     Quit date: 12/15/1966  . Smokeless tobacco: Never Used  . Alcohol Use: No  . Drug Use: No  . Sexual Activity: Not on file   Other Topics Concern  . Not on file   Social History Narrative    Family History  Problem Relation Age of Onset  . Cancer Mother   . Hypertension Mother   . Hypertension Father   . Diabetes Daughter     Allergies as of 04/17/2014 - Review Complete 04/12/2014  Allergen Reaction Noted  . Codeine Nausea Only 01/28/2012  . Sulfa antibiotics Nausea Only 01/28/2012    Current Facility-Administered Medications on File Prior to Encounter  Medication Dose Route Frequency Provider Last Rate Last Dose  . cyclopentolate-phenylephrine (CYCLOMYDRYL) 0.2-1 % ophthalmic solution 1 drop  1 drop Right Eye Once Williams Che, MD      . ketorolac (ACULAR) 0.5 % ophthalmic solution 1 drop  1 drop Right Eye Once Williams Che, MD       Current Outpatient Prescriptions on File Prior to Encounter  Medication Sig Dispense Refill  . aspirin EC 81 MG tablet Take 81 mg by mouth at bedtime.    . furosemide (LASIX) 20 MG tablet Take 20 mg by mouth 3 (three) times  daily.     . hydrALAZINE (APRESOLINE) 100 MG tablet Take 100 mg by mouth 2 (two) times daily.     . insulin glargine (LANTUS SOLOSTAR) 100 UNIT/ML injection Inject 15 Units into the skin 2 (two) times daily.     . iron polysaccharides (NIFEREX) 150 MG capsule Take 150 mg by mouth 2 (two) times daily.    . isosorbide mononitrate (IMDUR) 60 MG 24 hr tablet Take 60 mg by mouth daily.    Marland Kitchen levothyroxine (SYNTHROID, LEVOTHROID) 112 MCG tablet Take 112 mcg by mouth daily.    . metoprolol succinate (TOPROL-XL) 50 MG 24 hr tablet TAKE 1 & 1/2 TABLETS BY MOUTH EVERY DAY (PT WANTS ONLY #15 CUT IN HALF ) 45 tablet 6  . NIFEdipine (ADALAT CC) 90 MG 24 hr tablet Take 90 mg by mouth daily.    . simvastatin (ZOCOR) 80 MG tablet Take 40 mg by mouth at bedtime.         PHYSICAL EXAMINATION:  General: The patient is a  well-nourished male, in no acute distress. Vital signs are Pulse 63  Temp(Src) 98.3 F (36.8 C) (Oral)  Resp 18  Ht 5\' 10"  (1.778 m)  Wt 164 lb (74.39 kg)  BMI 23.53 kg/m2  SpO2 99% Pulmonary: There is a good air exchange   Musculoskeletal: There are no major deformities.  There is no significant extremity pain. Neurologic: No focal weakness or paresthesias are detected, Skin: There are no ulcer or rashes noted. Psychiatric: The patient has normal affect. Cardiovascular: 2+ radial pulses bilaterally     Impression and Plan:  History of chronic renal insufficiency now progressed to end-stage renal disease for catheter placement. Will have outpatient permanent access following initiation of hemodialysis.    EARLY, TODD [facility] Office: [phone]

## 2014-04-22 NOTE — Transfer of Care (Signed)
Immediate Anesthesia Transfer of Care Note  Patient: Corey Riley  Procedure(s) Performed: Procedure(s): INSERTION OF DIALYSIS CATHETER RIGHT INTERNAL JUGULAR VEIN (Right)  Patient Location: PACU  Anesthesia Type:MAC  Level of Consciousness: awake, alert , oriented and patient cooperative  Airway & Oxygen Therapy: Patient Spontanous Breathing  Post-op Assessment: Report given to RN and Post -op Vital signs reviewed and stable  Post vital signs: Reviewed and stable  Last Vitals:  Filed Vitals:   04/22/14 1425  BP: 210/87  Pulse:   Temp:   Resp:     Complications: No apparent anesthesia complications

## 2014-04-22 NOTE — Op Note (Signed)
    OPERATIVE REPORT  DATE OF SURGERY: 04/22/2014  PATIENT: Corey Riley, 79 y.o. male MRN: [id number]  DOB: [birth year]  PRE-OPERATIVE DIAGNOSIS: End-stage renal disease  POST-OPERATIVE DIAGNOSIS:  Same  PROCEDURE: Right IJ hemodialysis catheter with SonoSite localization  SURGEON:  Curt Jews, M.D.  PHYSICIAN ASSISTANT: Nurse  ANESTHESIA:  Local with sedation  EBL: Minimal ml     BLOOD ADMINISTERED: None  DRAINS: None  SPECIMEN: None  COUNTS CORRECT:  YES  PLAN OF CARE: PACU with chest x-ray pending   PATIENT DISPOSITION:  PACU - hemodynamically stable  PROCEDURE DETAILS: The patient was taken to the operating room placed supine position where the area of the right neck and left neck and chest were prepped and draped in usual sterile fashion. Using SonoSite ultrasound visualization and a singlewall puncture with an 18-gauge needle the right internal jugular vein was accessed and a guidewire was placed using Seldinger technique down to the level the right atrium. This was confirmed with fluoroscopy. A dilator and peel-away sheath was passed over the guidewire and the dilator and guidewire removed. A 23 cm hemodialysis catheter was passed through the sheath and the sheath was removed. The tips were placed the distal right atrium. The catheter was was brought through a subcutaneous tunnel through a separate stab incision using local anesthesia. The 2 lm ports were attached. Both lumens flushed and aspirated easily and were locked with 1000 unit per cc heparin. The catheter was secured to the skin with a 3-0 nylon stitch and the entry site was closed with a 4-0 subcuticular Vicryl stitch. Was applied  I did image his right arm veins with SonoSite and this did show a large caliber basilic vein from the antecubital space approximately. He did have a nice cephalic vein in the forearm. This did track over the anterior wrist towards the dorsum of the hand but was of good caliber  through this area as well.   Curt Jews, M.D. 04/22/2014 3:23 PM

## 2014-04-22 NOTE — Telephone Encounter (Signed)
Wife informed

## 2014-04-22 NOTE — Telephone Encounter (Signed)
-----   Message from Herminio Commons, MD sent at 04/19/2014  6:09 PM EDT ----- Myocardial scar tissue with no new blockages identified. Continue medical therapy for CAD.

## 2014-04-22 NOTE — Anesthesia Postprocedure Evaluation (Signed)
Anesthesia Post Note  Patient: Corey Riley  Procedure(s) Performed: Procedure(s) (LRB): INSERTION OF DIALYSIS CATHETER RIGHT INTERNAL JUGULAR VEIN (Right)  Anesthesia type: MAC  Patient location: PACU  Post pain: Pain level controlled and Adequate analgesia  Post assessment: Post-op Vital signs reviewed, Patient's Cardiovascular Status Stable and Respiratory Function Stable  Last Vitals:  Filed Vitals:   04/22/14 1600  BP: 176/70  Pulse: 67  Temp: 36.4 C  Resp: 13    Post vital signs: Reviewed and stable  Level of consciousness: awake, alert  and oriented  Complications: No apparent anesthesia complications

## 2014-04-22 NOTE — Anesthesia Preprocedure Evaluation (Signed)
Anesthesia Evaluation  Patient identified by MRN, date of birth, ID band Patient awake    Reviewed: Allergy & Precautions, NPO status , Patient's Chart, lab work & pertinent test results  Airway Mallampati: II   Neck ROM: full    Dental   Pulmonary shortness of breath, former smoker,          Cardiovascular hypertension, + angina + CAD     Neuro/Psych    GI/Hepatic   Endo/Other  diabetes, Type 2Hypothyroidism   Renal/GU ESRF and DialysisRenal disease     Musculoskeletal   Abdominal   Peds  Hematology   Anesthesia Other Findings   Reproductive/Obstetrics                             Anesthesia Physical Anesthesia Plan  ASA: III  Anesthesia Plan: MAC   Post-op Pain Management:    Induction: Intravenous  Airway Management Planned: Simple Face Mask  Additional Equipment:   Intra-op Plan:   Post-operative Plan:   Informed Consent: I have reviewed the patients History and Physical, chart, labs and discussed the procedure including the risks, benefits and alternatives for the proposed anesthesia with the patient or authorized representative who has indicated his/her understanding and acceptance.     Plan Discussed with: CRNA, Anesthesiologist and Surgeon  Anesthesia Plan Comments:         Anesthesia Quick Evaluation

## 2014-04-23 ENCOUNTER — Encounter (HOSPITAL_COMMUNITY): Payer: Self-pay | Admitting: Vascular Surgery

## 2014-04-30 ENCOUNTER — Other Ambulatory Visit: Payer: Self-pay | Admitting: *Deleted

## 2014-04-30 DIAGNOSIS — N186 End stage renal disease: Secondary | ICD-10-CM

## 2014-04-30 DIAGNOSIS — Z01810 Encounter for preprocedural cardiovascular examination: Secondary | ICD-10-CM

## 2014-05-02 ENCOUNTER — Other Ambulatory Visit: Payer: Self-pay

## 2014-05-02 ENCOUNTER — Telehealth: Payer: Self-pay

## 2014-05-02 NOTE — Telephone Encounter (Signed)
-----   Message from Rosetta Posner, MD sent at 04/22/2014  3:28 PM EDT ----- Hemodialysis tunneled catheter with SonoSite visualization. Needs a right arm AV fistula and 2 weeks after stabilized on outpatient hemodialysis

## 2014-05-07 ENCOUNTER — Encounter (HOSPITAL_COMMUNITY): Payer: Self-pay | Admitting: *Deleted

## 2014-05-07 MED ORDER — SODIUM CHLORIDE 0.9 % IV SOLN: INTRAVENOUS | Status: DC

## 2014-05-07 MED ORDER — DEXTROSE 5 % IV SOLN
1.5000 g | INTRAVENOUS | Status: AC
  Administered 2014-05-08: 1.5 g via INTRAVENOUS
  Filled 2014-05-07: qty 1.5

## 2014-05-07 MED ORDER — CHLORHEXIDINE GLUCONATE CLOTH 2 % EX PADS: 6.0000 | MEDICATED_PAD | Freq: Once | CUTANEOUS | Status: DC

## 2014-05-07 NOTE — Progress Notes (Signed)
Pt denies any recent chest pain ?

## 2014-05-08 ENCOUNTER — Ambulatory Visit (HOSPITAL_COMMUNITY)
Admission: RE | Admit: 2014-05-08 | Discharge: 2014-05-08 | Disposition: A | Discharge status: Home / Self Care | Payer: Medicare Other | Source: Ambulatory Visit | Attending: Vascular Surgery | Admitting: Vascular Surgery

## 2014-05-08 ENCOUNTER — Encounter (HOSPITAL_COMMUNITY)
Admission: RE | Disposition: A | Discharge status: Home / Self Care | Payer: Self-pay | Source: Ambulatory Visit | Attending: Vascular Surgery

## 2014-05-08 ENCOUNTER — Ambulatory Visit (HOSPITAL_COMMUNITY): Payer: Medicare Other | Admitting: Certified Registered Nurse Anesthetist

## 2014-05-08 ENCOUNTER — Encounter (HOSPITAL_COMMUNITY): Payer: Self-pay | Admitting: Certified Registered Nurse Anesthetist

## 2014-05-08 DIAGNOSIS — E039 Hypothyroidism, unspecified: Secondary | ICD-10-CM | POA: Insufficient documentation

## 2014-05-08 DIAGNOSIS — Z886 Allergy status to analgesic agent status: Secondary | ICD-10-CM | POA: Diagnosis not present

## 2014-05-08 DIAGNOSIS — Z905 Acquired absence of kidney: Secondary | ICD-10-CM | POA: Insufficient documentation

## 2014-05-08 DIAGNOSIS — D649 Anemia, unspecified: Secondary | ICD-10-CM | POA: Insufficient documentation

## 2014-05-08 DIAGNOSIS — E782 Mixed hyperlipidemia: Secondary | ICD-10-CM | POA: Diagnosis not present

## 2014-05-08 DIAGNOSIS — Z7982 Long term (current) use of aspirin: Secondary | ICD-10-CM | POA: Diagnosis not present

## 2014-05-08 DIAGNOSIS — I350 Nonrheumatic aortic (valve) stenosis: Secondary | ICD-10-CM | POA: Insufficient documentation

## 2014-05-08 DIAGNOSIS — J189 Pneumonia, unspecified organism: Secondary | ICD-10-CM | POA: Diagnosis not present

## 2014-05-08 DIAGNOSIS — I2510 Atherosclerotic heart disease of native coronary artery without angina pectoris: Secondary | ICD-10-CM | POA: Diagnosis not present

## 2014-05-08 DIAGNOSIS — E119 Type 2 diabetes mellitus without complications: Secondary | ICD-10-CM | POA: Diagnosis not present

## 2014-05-08 DIAGNOSIS — N186 End stage renal disease: Secondary | ICD-10-CM | POA: Insufficient documentation

## 2014-05-08 DIAGNOSIS — Z882 Allergy status to sulfonamides status: Secondary | ICD-10-CM | POA: Insufficient documentation

## 2014-05-08 DIAGNOSIS — Z87891 Personal history of nicotine dependence: Secondary | ICD-10-CM | POA: Insufficient documentation

## 2014-05-08 DIAGNOSIS — I120 Hypertensive chronic kidney disease with stage 5 chronic kidney disease or end stage renal disease: Secondary | ICD-10-CM | POA: Diagnosis present

## 2014-05-08 HISTORY — DX: Pneumonia, unspecified organism: J18.9

## 2014-05-08 HISTORY — DX: Constipation, unspecified: K59.00

## 2014-05-08 HISTORY — PX: AV FISTULA PLACEMENT: SHX1204

## 2014-05-08 LAB — POCT I-STAT 4, (NA,K, GLUC, HGB,HCT)
GLUCOSE: 137 mg/dL — AB (ref 70–99)
HEMATOCRIT: 30 % — AB (ref 39.0–52.0)
Hemoglobin: 10.2 g/dL — ABNORMAL LOW (ref 13.0–17.0)
POTASSIUM: 3.4 mmol/L — AB (ref 3.5–5.1)
Sodium: 140 mmol/L (ref 135–145)

## 2014-05-08 LAB — GLUCOSE, CAPILLARY: GLUCOSE-CAPILLARY: 128 mg/dL — AB (ref 70–99)

## 2014-05-08 SURGERY — ARTERIOVENOUS (AV) FISTULA CREATION
Anesthesia: Monitor Anesthesia Care | Site: Arm Lower | Laterality: Right

## 2014-05-08 MED ORDER — FENTANYL CITRATE 0.05 MG/ML IJ SOLN
INTRAMUSCULAR | Status: AC
  Filled 2014-05-08: qty 5

## 2014-05-08 MED ORDER — SODIUM CHLORIDE 0.9 % IV SOLN
INTRAVENOUS | Status: DC | PRN
  Administered 2014-05-08 (×2): via INTRAVENOUS

## 2014-05-08 MED ORDER — SODIUM CHLORIDE 0.9 % IR SOLN
Status: DC | PRN
  Administered 2014-05-08: 500 mL

## 2014-05-08 MED ORDER — EPHEDRINE SULFATE 50 MG/ML IJ SOLN
INTRAMUSCULAR | Status: AC
  Filled 2014-05-08: qty 1

## 2014-05-08 MED ORDER — MIDAZOLAM HCL 2 MG/2ML IJ SOLN
INTRAMUSCULAR | Status: AC
  Filled 2014-05-08: qty 2

## 2014-05-08 MED ORDER — STERILE WATER FOR INJECTION IJ SOLN
INTRAMUSCULAR | Status: AC
  Filled 2014-05-08: qty 10

## 2014-05-08 MED ORDER — PHENYLEPHRINE HCL 10 MG/ML IJ SOLN
INTRAMUSCULAR | Status: DC | PRN
  Administered 2014-05-08 (×4): 80 ug via INTRAVENOUS

## 2014-05-08 MED ORDER — ROCURONIUM BROMIDE 50 MG/5ML IV SOLN
INTRAVENOUS | Status: AC
  Filled 2014-05-08: qty 1

## 2014-05-08 MED ORDER — EPHEDRINE SULFATE 50 MG/ML IJ SOLN
INTRAMUSCULAR | Status: DC | PRN
  Administered 2014-05-08 (×3): 10 mg via INTRAVENOUS

## 2014-05-08 MED ORDER — ONDANSETRON HCL 4 MG/2ML IJ SOLN: 4.0000 mg | Freq: Once | INTRAMUSCULAR | Status: DC | PRN

## 2014-05-08 MED ORDER — SUCCINYLCHOLINE CHLORIDE 20 MG/ML IJ SOLN
INTRAMUSCULAR | Status: AC
  Filled 2014-05-08: qty 1

## 2014-05-08 MED ORDER — PROPOFOL 10 MG/ML IV BOLUS
INTRAVENOUS | Status: DC | PRN
  Administered 2014-05-08: 170 mg via INTRAVENOUS

## 2014-05-08 MED ORDER — 0.9 % SODIUM CHLORIDE (POUR BTL) OPTIME
TOPICAL | Status: DC | PRN
  Administered 2014-05-08: 1000 mL

## 2014-05-08 MED ORDER — LIDOCAINE-EPINEPHRINE (PF) 1 %-1:200000 IJ SOLN
INTRAMUSCULAR | Status: AC
  Filled 2014-05-08: qty 10

## 2014-05-08 MED ORDER — LIDOCAINE HCL (CARDIAC) 20 MG/ML IV SOLN
INTRAVENOUS | Status: DC | PRN
  Administered 2014-05-08: 20 mg via INTRAVENOUS

## 2014-05-08 MED ORDER — FENTANYL CITRATE 0.05 MG/ML IJ SOLN
INTRAMUSCULAR | Status: DC | PRN
  Administered 2014-05-08: 25 ug via INTRAVENOUS
  Administered 2014-05-08: 100 ug via INTRAVENOUS

## 2014-05-08 MED ORDER — STERILE WATER FOR INJECTION IJ SOLN
INTRAMUSCULAR | Status: AC
Start: 2014-05-08 — End: 2014-05-08
  Filled 2014-05-08: qty 10

## 2014-05-08 MED ORDER — ONDANSETRON HCL 4 MG/2ML IJ SOLN
INTRAMUSCULAR | Status: AC
  Filled 2014-05-08: qty 2

## 2014-05-08 MED ORDER — FENTANYL CITRATE 0.05 MG/ML IJ SOLN: 25.0000 ug | INTRAMUSCULAR | Status: DC | PRN

## 2014-05-08 MED ORDER — PROPOFOL 10 MG/ML IV BOLUS
INTRAVENOUS | Status: AC
  Filled 2014-05-08: qty 20

## 2014-05-08 MED ORDER — ONDANSETRON HCL 4 MG/2ML IJ SOLN
INTRAMUSCULAR | Status: DC | PRN
  Administered 2014-05-08: 4 mg via INTRAVENOUS

## 2014-05-08 MED ORDER — OXYCODONE-ACETAMINOPHEN 5-325 MG PO TABS: 1.0000 | ORAL_TABLET | Freq: Four times a day (QID) | ORAL | Status: DC | PRN

## 2014-05-08 SURGICAL SUPPLY — 26 items
ARMBAND PINK RESTRICT EXTREMIT (MISCELLANEOUS) ×3 IMPLANT
CANISTER SUCTION 2500CC (MISCELLANEOUS) ×3 IMPLANT
CLIP TI MEDIUM 6 (CLIP) ×3 IMPLANT
CLIP TI WIDE RED SMALL 6 (CLIP) ×3 IMPLANT
COVER PROBE W GEL 5X96 (DRAPES) ×2 IMPLANT
DRAIN PENROSE 1/4X12 LTX STRL (WOUND CARE) ×3 IMPLANT
ELECT REM PT RETURN 9FT ADLT (ELECTROSURGICAL) ×3
ELECTRODE REM PT RTRN 9FT ADLT (ELECTROSURGICAL) ×1 IMPLANT
GEL ULTRASOUND 20GR AQUASONIC (MISCELLANEOUS) IMPLANT
GLOVE SS BIOGEL STRL SZ 7 (GLOVE) ×1 IMPLANT
GLOVE SUPERSENSE BIOGEL SZ 7 (GLOVE) ×2
GOWN STRL REUS W/ TWL LRG LVL3 (GOWN DISPOSABLE) ×3 IMPLANT
GOWN STRL REUS W/TWL LRG LVL3 (GOWN DISPOSABLE) ×9
KIT BASIN OR (CUSTOM PROCEDURE TRAY) ×3 IMPLANT
KIT ROOM TURNOVER OR (KITS) ×3 IMPLANT
LIQUID BAND (GAUZE/BANDAGES/DRESSINGS) ×3 IMPLANT
NS IRRIG 1000ML POUR BTL (IV SOLUTION) ×3 IMPLANT
PACK CV ACCESS (CUSTOM PROCEDURE TRAY) ×3 IMPLANT
PAD ARMBOARD 7.5X6 YLW CONV (MISCELLANEOUS) ×6 IMPLANT
SUT PROLENE 6 0 BV (SUTURE) ×3 IMPLANT
SUT PROLENE 6 0 CC (SUTURE) ×2 IMPLANT
SUT VIC AB 3-0 SH 27 (SUTURE) ×6
SUT VIC AB 3-0 SH 27X BRD (SUTURE) ×1 IMPLANT
SUT VIC AB 4-0 PS2 27 (SUTURE) ×2 IMPLANT
UNDERPAD 30X30 INCONTINENT (UNDERPADS AND DIAPERS) ×3 IMPLANT
WATER STERILE IRR 1000ML POUR (IV SOLUTION) ×3 IMPLANT

## 2014-05-08 NOTE — H&P (View-Only) (Signed)
Patient name: Corey Riley MRN: [id number] DOB: 10/16/1935 Sex: male   Referred by: Karsten Fells  Reason for referral: ESRD  HISTORY OF PRESENT ILLNESS: The patient presents today for hemodialysis catheter placement. He had been seen in our office 6 months ago. At that time he has chronic renal insufficiency but have not progressed to renal failure. He has had worsening renal function now presents for placement of a tunneled catheter. Ultrasound 6 months ago of his upper extremities revealed no usable veins for fistula in the left arm. Only option was an upper arm basilic vein in the right arm.  Past Medical History  Diagnosis Date  . Essential hypertension, benign   . Type 2 diabetes mellitus   . Mixed hyperlipidemia   . Hypothyroidism   . Coronary atherosclerosis of native coronary artery     DES LAD 8/03  . Aortic stenosis     Mild  . Shortness of breath   . Chronic kidney disease     pt states 30% kidney function  . Anemia   . Anginal pain     "a little."   . Heart murmur     "faint"    Past Surgical History  Procedure Laterality Date  . Rotator cuff repair Left     left  . Nephrectomy      Left   . Cataract extraction w/phaco  02/07/2012    Procedure: CATARACT EXTRACTION PHACO AND INTRAOCULAR LENS PLACEMENT (IOC);  Surgeon: Williams Che, MD;  Location: [facility];  Service: Ophthalmology;  Laterality: Right;  CDE:24.87  . Cataract extraction w/phaco Left 04/17/2012    Procedure: CATARACT EXTRACTION PHACO AND INTRAOCULAR LENS PLACEMENT (IOC);  Surgeon: Williams Che, MD;  Location: [facility];  Service: Ophthalmology;  Laterality: Left;  CDE=20.75  . Jaw implant      History   Social History  . Marital Status: Married    Spouse Name: N/A  . Number of Children: N/A  . Years of Education: N/A   Occupational History  . Not on file.   Social History Main Topics  . Smoking status: Former Smoker -- 1.50 packs/day    Types: Cigarettes    Start date: 08/14/1955     Quit date: 12/15/1966  . Smokeless tobacco: Never Used  . Alcohol Use: No  . Drug Use: No  . Sexual Activity: Not on file   Other Topics Concern  . Not on file   Social History Narrative    Family History  Problem Relation Age of Onset  . Cancer Mother   . Hypertension Mother   . Hypertension Father   . Diabetes Daughter     Allergies as of 04/17/2014 - Review Complete 04/12/2014  Allergen Reaction Noted  . Codeine Nausea Only 01/28/2012  . Sulfa antibiotics Nausea Only 01/28/2012    Current Facility-Administered Medications on File Prior to Encounter  Medication Dose Route Frequency Provider Last Rate Last Dose  . cyclopentolate-phenylephrine (CYCLOMYDRYL) 0.2-1 % ophthalmic solution 1 drop  1 drop Right Eye Once Williams Che, MD      . ketorolac (ACULAR) 0.5 % ophthalmic solution 1 drop  1 drop Right Eye Once Williams Che, MD       Current Outpatient Prescriptions on File Prior to Encounter  Medication Sig Dispense Refill  . aspirin EC 81 MG tablet Take 81 mg by mouth at bedtime.    . furosemide (LASIX) 20 MG tablet Take 20 mg by mouth 3 (three) times  daily.     . hydrALAZINE (APRESOLINE) 100 MG tablet Take 100 mg by mouth 2 (two) times daily.     . insulin glargine (LANTUS SOLOSTAR) 100 UNIT/ML injection Inject 15 Units into the skin 2 (two) times daily.     . iron polysaccharides (NIFEREX) 150 MG capsule Take 150 mg by mouth 2 (two) times daily.    . isosorbide mononitrate (IMDUR) 60 MG 24 hr tablet Take 60 mg by mouth daily.    Marland Kitchen levothyroxine (SYNTHROID, LEVOTHROID) 112 MCG tablet Take 112 mcg by mouth daily.    . metoprolol succinate (TOPROL-XL) 50 MG 24 hr tablet TAKE 1 & 1/2 TABLETS BY MOUTH EVERY DAY (PT WANTS ONLY #15 CUT IN HALF ) 45 tablet 6  . NIFEdipine (ADALAT CC) 90 MG 24 hr tablet Take 90 mg by mouth daily.    . simvastatin (ZOCOR) 80 MG tablet Take 40 mg by mouth at bedtime.         PHYSICAL EXAMINATION:  General: The patient is a  well-nourished male, in no acute distress. Vital signs are Pulse 63  Temp(Src) 98.3 F (36.8 C) (Oral)  Resp 18  Ht 5\' 10"  (1.778 m)  Wt 164 lb (74.39 kg)  BMI 23.53 kg/m2  SpO2 99% Pulmonary: There is a good air exchange   Musculoskeletal: There are no major deformities.  There is no significant extremity pain. Neurologic: No focal weakness or paresthesias are detected, Skin: There are no ulcer or rashes noted. Psychiatric: The patient has normal affect. Cardiovascular: 2+ radial pulses bilaterally     Impression and Plan:  History of chronic renal insufficiency now progressed to end-stage renal disease for catheter placement. Will have outpatient permanent access following initiation of hemodialysis.    Kieon Lawhorn [facility] Office: [phone]

## 2014-05-08 NOTE — Interval H&P Note (Signed)
History and Physical Interval Note:  05/08/2014 8:21 AM  Corey Riley  has presented today for surgery, with the diagnosis of End Stage Renal Disease N18.6  The various methods of treatment have been discussed with the patient and family. After consideration of risks, benefits and other options for treatment, the patient has consented to  Procedure(s): ARTERIOVENOUS (AV) FISTULA CREATION-RIGHT (Right) as a surgical intervention .  The patient's history has been reviewed, patient examined, no change in status, stable for surgery.  I have reviewed the patient's chart and labs.  Questions were answered to the patient's satisfaction.     Tinnie Gens

## 2014-05-08 NOTE — Anesthesia Procedure Notes (Signed)
Procedure Name: LMA Insertion Date/Time: 05/08/2014 8:43 AM Performed by: Ollen Bowl Pre-anesthesia Checklist: Patient identified, Emergency Drugs available, Suction available, Patient being monitored and Timeout performed Patient Re-evaluated:Patient Re-evaluated prior to inductionOxygen Delivery Method: Circle system utilized and Simple face mask Preoxygenation: Pre-oxygenation with 100% oxygen Intubation Type: IV induction Ventilation: Mask ventilation without difficulty LMA: LMA inserted LMA Size: 5.0 Number of attempts: 1 Airway Equipment and Method: Patient positioned with wedge pillow Placement Confirmation: positive ETCO2 and breath sounds checked- equal and bilateral Tube secured with: Tape Dental Injury: Teeth and Oropharynx as per pre-operative assessment

## 2014-05-08 NOTE — Transfer of Care (Signed)
Immediate Anesthesia Transfer of Care Note  Patient: Corey Riley  Procedure(s) Performed: Procedure(s): ARTERIOVENOUS (AV) FISTULA CREATION-RIGHT (Right)  Patient Location: PACU  Anesthesia Type:General  Level of Consciousness: awake and alert   Airway & Oxygen Therapy: Patient Spontanous Breathing and Patient connected to nasal cannula oxygen  Post-op Assessment: Report given to RN, Post -op Vital signs reviewed and stable and Patient moving all extremities X 4  Post vital signs: Reviewed and stable  Last Vitals:  Filed Vitals:   05/08/14 0630  BP: 164/61  Pulse: 56  Temp: 36.4 C  Resp: 14    Complications: No apparent anesthesia complications

## 2014-05-08 NOTE — Anesthesia Postprocedure Evaluation (Signed)
  Anesthesia Post-op Note  Patient: Corey Riley  Procedure(s) Performed: Procedure(s): ARTERIOVENOUS (AV) FISTULA CREATION-RIGHT (Right)  Patient Location: PACU  Anesthesia Type:General  Level of Consciousness: awake, alert  and oriented  Airway and Oxygen Therapy: Patient Spontanous Breathing and Patient connected to nasal cannula oxygen  Post-op Pain: none  Post-op Assessment: Post-op Vital signs reviewed, Patient's Cardiovascular Status Stable, Respiratory Function Stable, Patent Airway, No signs of Nausea or vomiting and Pain level controlled  Post-op Vital Signs: stable  Last Vitals:  Filed Vitals:   05/08/14 1130  BP: 143/66  Pulse: 73  Temp:   Resp: 16    Complications: No apparent anesthesia complications

## 2014-05-08 NOTE — Op Note (Signed)
OPERATIVE REPORT  Date of Surgery: 05/08/2014  Surgeon: Tinnie Gens, MD  Assistant: Gerri Lins PA  Pre-op Diagnosis: End Stage Renal Disease N18.6  Post-op Diagnosis: End Stage Renal Disease N18.6  Procedure: Procedure(s): ARTERIOVENOUS (AV) FISTULA CREATION-RIGHT-radial-cephalic  Anesthesia: LMA  EBL: Minimal  Complications: None  Procedure Details: The patient was taken the operating room placed in supine position at which time satisfactory general-LMA anesthesia was minister. Right upper extremity was then imaged throughout with B mode ultrasound. The forearm cephalic vein was of good quality. There was some concern about the radial artery because of calcification. The basilic vein in the upper arm was of good quality but the cephalic vein in the upper arm was not adequate. After prepping and draping in routine sterile manner short longitudinal incision was made over the radial artery distally down through subcutaneous tissue the artery was exposed. It was diffusely calcified but did have an excellent palpable pulse and good Doppler flow. The cephalic vein was located much more laterally than usual necessitating a separate incision to expose the distal cephalic vein. His branches ligated with 3 and 4-0 silk ties and divided it was ligated distally transected gently dilated with heparinized saline was an excellent vein up to the antecubital area at least 3 mm in size. Tunnel was in created between these 2 incisions and it was delivered into the wound was the radial artery been exposed. No heparin was given. Artery was occluded proximally and distally opened with 15 blade. It was somewhat crunchy from the calcification but did have a 2-1/2-3 mm lumen and fairly good inflow and backbleeding. Vein was carefully measured spatulated and anastomosed inside with 6-0 Prolene. Clamps were then released and there was a good pulse and palpable thrill in the fistula up to the antecubital area with  great Doppler flow and also good Doppler flow in the radial and ulnar arteries which improved with compression of the fistula. Adequate hemostasis was achieved the wounds closed in layers with Vicryl in subcuticular fashion sterile dressing applied patient taken to recovery room in stable condition   Tinnie Gens, MD 05/08/2014 9:59 AM

## 2014-05-08 NOTE — Anesthesia Preprocedure Evaluation (Signed)
Anesthesia Evaluation  Patient identified by MRN, date of birth, ID band Patient awake    Reviewed: Allergy & Precautions, NPO status , Patient's Chart, lab work & pertinent test results  Airway Mallampati: II  TM Distance: >3 FB Neck ROM: Full    Dental  (+) Teeth Intact, Dental Advisory Given   Pulmonary former smoker,  breath sounds clear to auscultation        Cardiovascular hypertension, Rhythm:Regular Rate:Normal     Neuro/Psych    GI/Hepatic   Endo/Other  diabetes  Renal/GU      Musculoskeletal   Abdominal   Peds  Hematology   Anesthesia Other Findings   Reproductive/Obstetrics                             Anesthesia Physical Anesthesia Plan  ASA: III  Anesthesia Plan: MAC   Post-op Pain Management:    Induction: Intravenous  Airway Management Planned: Simple Face Mask and Natural Airway  Additional Equipment:   Intra-op Plan:   Post-operative Plan:   Informed Consent: I have reviewed the patients History and Physical, chart, labs and discussed the procedure including the risks, benefits and alternatives for the proposed anesthesia with the patient or authorized representative who has indicated his/her understanding and acceptance.   Dental advisory given  Plan Discussed with: CRNA and Anesthesiologist  Anesthesia Plan Comments: (ESRD last HD 4/5 K-3.4 Type 2 DM glucose 137 CAD S/P Stent 2003, (-) nuclear stress test 04/19/14  Plan MAC  Roberts Gaudy)        Anesthesia Quick Evaluation

## 2014-05-09 ENCOUNTER — Encounter (HOSPITAL_COMMUNITY): Payer: Self-pay | Admitting: Vascular Surgery

## 2014-05-09 ENCOUNTER — Telehealth: Payer: Self-pay | Admitting: Vascular Surgery

## 2014-05-09 NOTE — Telephone Encounter (Addendum)
LM for pt to call and confirm follow up date/time, dpm  05/15/14: spoke with pt to confirm, dpm

## 2014-05-09 NOTE — Telephone Encounter (Signed)
-----   Message from Mena Goes, RN sent at 05/08/2014 11:52 AM EDT ----- Regarding: Schedule   ----- Message -----    From: Ulyses Amor, PA-C    Sent: 05/08/2014   9:52 AM      To: [facility] Charge Pool  Right RC av fistula creation f/u in 4-6 weeks with Dr. Kellie Simmering no study needed.

## 2014-05-14 ENCOUNTER — Encounter (HOSPITAL_COMMUNITY): Payer: Medicare Other

## 2014-05-14 ENCOUNTER — Ambulatory Visit: Payer: Medicare Other | Admitting: Vascular Surgery

## 2014-05-14 ENCOUNTER — Other Ambulatory Visit (HOSPITAL_COMMUNITY): Payer: Medicare Other

## 2014-05-29 ENCOUNTER — Encounter: Payer: Self-pay | Admitting: Cardiovascular Disease

## 2014-05-29 ENCOUNTER — Ambulatory Visit (INDEPENDENT_AMBULATORY_CARE_PROVIDER_SITE_OTHER): Payer: Medicare Other | Admitting: Cardiovascular Disease

## 2014-05-29 VITALS — BP 126/53 | HR 58 | Ht 70.0 in | Wt 156.0 lb

## 2014-05-29 DIAGNOSIS — N185 Chronic kidney disease, stage 5: Secondary | ICD-10-CM

## 2014-05-29 DIAGNOSIS — R9431 Abnormal electrocardiogram [ECG] [EKG]: Secondary | ICD-10-CM

## 2014-05-29 DIAGNOSIS — I151 Hypertension secondary to other renal disorders: Secondary | ICD-10-CM

## 2014-05-29 DIAGNOSIS — I6523 Occlusion and stenosis of bilateral carotid arteries: Secondary | ICD-10-CM

## 2014-05-29 DIAGNOSIS — R5383 Other fatigue: Secondary | ICD-10-CM | POA: Diagnosis not present

## 2014-05-29 DIAGNOSIS — N2889 Other specified disorders of kidney and ureter: Secondary | ICD-10-CM

## 2014-05-29 DIAGNOSIS — E785 Hyperlipidemia, unspecified: Secondary | ICD-10-CM

## 2014-05-29 DIAGNOSIS — E118 Type 2 diabetes mellitus with unspecified complications: Secondary | ICD-10-CM

## 2014-05-29 DIAGNOSIS — I25118 Atherosclerotic heart disease of native coronary artery with other forms of angina pectoris: Secondary | ICD-10-CM | POA: Diagnosis not present

## 2014-05-29 DIAGNOSIS — I350 Nonrheumatic aortic (valve) stenosis: Secondary | ICD-10-CM

## 2014-05-29 MED ORDER — METOPROLOL SUCCINATE ER 50 MG PO TB24: 50.0000 mg | ORAL_TABLET | Freq: Every day | ORAL | Status: DC

## 2014-05-29 NOTE — Progress Notes (Signed)
Patient ID: Corey Riley, male   DOB: 1935/05/16, 79 y.o.   MRN: [id number]      SUBJECTIVE: The patient is here to followup for coronary artery disease, hypertension, hyperlipidemia and aortic stenosis. He also has CKD stage 5 and began hemodialysis in late March. He follows with Dr. Lowanda Foster (nephrology).  In summary, he has a history of coronary artery disease status post drug eluting stent placement to the left anterior descending in August 2003. He also has hypothyroidism and diabetes mellitus.  An echocardiogram performed on 02/09/2013 showed normal left ventricular systolic function, EF 14-43%, normal regional wall motion, grade 1 diastolic dysfunction, mild to moderate concentric LVH, moderate to severe left atrial enlargement, mild aortic stenosis with a mean gradient of 7 mm mercury, and trivial aortic regurgitation.   Nuclear stress testing on 3/18 demonstrated myocardial scar in the inferior and inferolateral walls with no evidence of ischemia. Mild inferior wall hypokinesis was noted, calculated LVEF 65%.  He denies chest pain and infra-axillary pain. He feels tired on the days of dialysis but feels somewhat better the following day. He does feel somewhat fatigued overall and these symptoms have not changed since his last visit with me.   He and his wife are planning on taking their grandchildren to [place] in August.    Review of Systems: As per "subjective", otherwise negative.  Allergies  Allergen Reactions  . Codeine Nausea Only  . Sulfa Antibiotics Nausea Only    Current Outpatient Prescriptions  Medication Sig Dispense Refill  . acetaminophen (TYLENOL) 325 MG tablet Take 1 tablet (325 mg total) by mouth every 6 (six) hours as needed. (Patient taking differently: Take 325 mg by mouth every 6 (six) hours as needed (for pain). )    . ammonium lactate (LAC-HYDRIN) 12 % lotion Apply 1 application topically as needed (itchy skin). Apply to back and legs    . aspirin EC  81 MG tablet Take 81 mg by mouth at bedtime.    . Calcium Acetate 667 MG TABS Take 1 tablet by mouth 3 (three) times daily.    . Cholecalciferol (VITAMIN D) 2000 UNITS tablet Take 2,000 Units by mouth daily.    . furosemide (LASIX) 20 MG tablet Take 20 mg by mouth 3 (three) times daily.     . hydrALAZINE (APRESOLINE) 100 MG tablet Take 100 mg by mouth 2 (two) times daily.     . insulin glargine (LANTUS SOLOSTAR) 100 UNIT/ML injection Inject 15 Units into the skin 2 (two) times daily.     . isosorbide mononitrate (IMDUR) 60 MG 24 hr tablet Take 60 mg by mouth daily.    Marland Kitchen levothyroxine (SYNTHROID, LEVOTHROID) 112 MCG tablet Take 112 mcg by mouth daily.    . metoprolol succinate (TOPROL-XL) 50 MG 24 hr tablet TAKE 1 & 1/2 TABLETS BY MOUTH EVERY DAY (PT WANTS ONLY #15 CUT IN HALF ) 45 tablet 6  . NIFEdipine (ADALAT CC) 90 MG 24 hr tablet Take 90 mg by mouth daily.    . simvastatin (ZOCOR) 80 MG tablet Take 40 mg by mouth at bedtime.      No current facility-administered medications for this visit.   Facility-Administered Medications Ordered in Other Visits  Medication Dose Route Frequency Provider Last Rate Last Dose  . cyclopentolate-phenylephrine (CYCLOMYDRYL) 0.2-1 % ophthalmic solution 1 drop  1 drop Right Eye Once Williams Che, MD      . ketorolac (ACULAR) 0.5 % ophthalmic solution 1 drop  1 drop Right Eye Once  Williams Che, MD        Past Medical History  Diagnosis Date  . Essential hypertension, benign   . Type 2 diabetes mellitus   . Mixed hyperlipidemia   . Hypothyroidism   . Coronary atherosclerosis of native coronary artery     DES LAD 8/03  . Aortic stenosis     Mild  . Anemia   . Anginal pain     "a little."   . Heart murmur     "faint"  . Shortness of breath     with exertion  . Chronic kidney disease     pt states 30% kidney function started dialysis 04/23/14 T/Th/Sa  . Pneumonia   . Constipation     Past Surgical History  Procedure Laterality Date  .  Rotator cuff repair Left     left  . Nephrectomy      Left   . Cataract extraction w/phaco  02/07/2012    Procedure: CATARACT EXTRACTION PHACO AND INTRAOCULAR LENS PLACEMENT (IOC);  Surgeon: Williams Che, MD;  Location: [facility];  Service: Ophthalmology;  Laterality: Right;  CDE:24.87  . Cataract extraction w/phaco Left 04/17/2012    Procedure: CATARACT EXTRACTION PHACO AND INTRAOCULAR LENS PLACEMENT (IOC);  Surgeon: Williams Che, MD;  Location: [facility];  Service: Ophthalmology;  Laterality: Left;  CDE=20.75  . Jaw implant    . Insertion of dialysis catheter Right 04/22/2014    Procedure: INSERTION OF DIALYSIS CATHETER RIGHT INTERNAL JUGULAR VEIN;  Surgeon: Rosetta Posner, MD;  Location: [facility];  Service: Vascular;  Laterality: Right;  . Vasectomy    . Colonoscopy    . Cyst removed      from back  . Cardiac catheterization    . Av fistula placement Right 05/08/2014    Procedure: ARTERIOVENOUS (AV) FISTULA CREATION-RIGHT;  Surgeon: Mal Misty, MD;  Location: [facility];  Service: Vascular;  Laterality: Right;    History   Social History  . Marital Status: Married    Spouse Name: N/A  . Number of Children: N/A  . Years of Education: N/A   Occupational History  . Not on file.   Social History Main Topics  . Smoking status: Former Smoker -- 1.50 packs/day for 11 years    Types: Cigarettes    Start date: 08/14/1955    Quit date: 12/15/1966  . Smokeless tobacco: Never Used  . Alcohol Use: No  . Drug Use: No  . Sexual Activity: Not on file   Other Topics Concern  . Not on file   Social History Narrative     Filed Vitals:   05/29/14 0955  BP: 126/53  Pulse: 58  Height: 5\' 10"  (1.778 m)  Weight: 156 lb (70.761 kg)    PHYSICAL EXAM General: NAD Neck: No JVD, no thyromegaly. Lungs: Clear to auscultation bilaterally with normal respiratory effort. CV: Nondisplaced PMI. Regular rate and rhythm, normal S1/S2, no E5/I7, II/VI systolic murmur along left sternal border and 3/6  apical holosystolic murmur. Trace peri-ankle edema b/l. Bilateral carotid bruits. Abdomen: Soft, nontender, no hepatosplenomegaly, no distention.  Neurologic: Alert and oriented x 3.  Psych: Normal affect. Skin: Normal. Musculoskeletal: Normal range of motion, no gross deformities. Extremities: No clubbing or cyanosis.   ECG: Most recent ECG reviewed.      ASSESSMENT AND PLAN: 1. CAD with prior stent placement: Nuclear stress test results noted above with myocardial scar and no ischemia. Continue aspirin, Imdur, metoprolol succinate (reduce dose to 50 mg daily),  and simvastatin. He has normal left ventricular systolic function and grade 1 diastolic dysfunction.  2. Essential HTN: Well controlled today. Continue hydralazine and nifedipine. Has one kidney. 3. Aortic stenosis: Mild with a mean gradient of 7 mmHg. Continue to monitor with surveillance echocardiography.  4. Hyperlipidemia: Continue simvastatin 40 mg daily. Lipids reviewed at last visit. 5. Diabetes mellitus with diabetic nephropathy: Labs reviewed. 6. CKD, stage 5: Now on hemodialysis. Will continue hydralazine and nifedipine to control BP. Unable to use ACEI/ARB for this reason.  7. Carotid bruit: Mild to moderate plaque disease bilaterally in 12/2013. Will repeat Dopplers in one year.  Dispo: f/u 6 months.  Kate Sable, M.D., F.A.C.C.

## 2014-05-29 NOTE — Patient Instructions (Signed)
Your physician wants you to follow-up in: 6 months with Dr. Virgina Jock will receive a reminder letter in the mail two months in advance. If you don't receive a letter, please call our office to schedule the follow-up appointment.  Your physician has recommended you make the following change in your medication:   DECREASE TOPROL XL 50 MG DAILY  CONTINUE ALL OTHER MEDICATIONS AS DIRECTED  Thank you for choosing [facility]!!

## 2014-06-03 ENCOUNTER — Encounter: Payer: Self-pay | Admitting: Vascular Surgery

## 2014-06-04 ENCOUNTER — Ambulatory Visit (INDEPENDENT_AMBULATORY_CARE_PROVIDER_SITE_OTHER): Payer: Self-pay | Admitting: Vascular Surgery

## 2014-06-04 ENCOUNTER — Encounter: Payer: Self-pay | Admitting: Vascular Surgery

## 2014-06-04 VITALS — BP 143/52 | HR 59 | Ht 70.0 in | Wt 155.1 lb

## 2014-06-04 DIAGNOSIS — N186 End stage renal disease: Secondary | ICD-10-CM

## 2014-06-04 NOTE — Progress Notes (Signed)
Subjective:     Patient ID: Corey Riley, male   DOB: 12-15-1935, 79 y.o.   MRN: [id number]  HPI this 6 her old male returns 4 weeks post right radial-cephalic AV fistula creation by me. Patient denies pain or numbness in the right hand. He does have a cold sensation on occasion. He is currently being dialyzed through a catheter in his right IJ without difficulty.   Review of Systems     Objective:   Physical Exam BP 143/52 mmHg  Pulse 59  Ht 5\' 10"  (1.778 m)  Wt 155 lb 1.6 oz (70.353 kg)  BMI 22.25 kg/m2  SpO2 98%  Gen. well-developed well-nourished male in no apparent distress alert and oriented 3 Right upper extremity with excellent pulse and palpable thrill in radial-cephalic AV fistula. 2 incisions well healed. Right hand well perfused.     Assessment:     Excellent function of right radial cephalic AV fistula 4 weeks post creation with no symptoms of significant steal    Plan:     Should be okay to use fistula following July 4 After fistula is utilized successfully for a week or 2 please notify us for removal of catheter Otherwise patient to return on a when necessary basis

## 2014-11-01 DIAGNOSIS — N186 End stage renal disease: Secondary | ICD-10-CM | POA: Diagnosis not present

## 2014-11-01 DIAGNOSIS — Z992 Dependence on renal dialysis: Secondary | ICD-10-CM | POA: Diagnosis not present

## 2014-12-02 DIAGNOSIS — N186 End stage renal disease: Secondary | ICD-10-CM | POA: Diagnosis not present

## 2014-12-02 DIAGNOSIS — Z992 Dependence on renal dialysis: Secondary | ICD-10-CM | POA: Diagnosis not present

## 2015-01-01 DIAGNOSIS — Z992 Dependence on renal dialysis: Secondary | ICD-10-CM | POA: Diagnosis not present

## 2015-01-01 DIAGNOSIS — N186 End stage renal disease: Secondary | ICD-10-CM | POA: Diagnosis not present

## 2015-01-02 ENCOUNTER — Ambulatory Visit (INDEPENDENT_AMBULATORY_CARE_PROVIDER_SITE_OTHER): Payer: Medicare Other | Admitting: Cardiovascular Disease

## 2015-01-02 ENCOUNTER — Encounter: Payer: Self-pay | Admitting: Cardiovascular Disease

## 2015-01-02 VITALS — BP 161/66 | HR 57 | Ht 70.0 in | Wt 154.0 lb

## 2015-01-02 DIAGNOSIS — I6523 Occlusion and stenosis of bilateral carotid arteries: Secondary | ICD-10-CM

## 2015-01-02 DIAGNOSIS — R9431 Abnormal electrocardiogram [ECG] [EKG]: Secondary | ICD-10-CM

## 2015-01-02 DIAGNOSIS — I25118 Atherosclerotic heart disease of native coronary artery with other forms of angina pectoris: Secondary | ICD-10-CM | POA: Diagnosis not present

## 2015-01-02 DIAGNOSIS — I350 Nonrheumatic aortic (valve) stenosis: Secondary | ICD-10-CM

## 2015-01-02 DIAGNOSIS — I151 Hypertension secondary to other renal disorders: Secondary | ICD-10-CM | POA: Diagnosis not present

## 2015-01-02 DIAGNOSIS — E785 Hyperlipidemia, unspecified: Secondary | ICD-10-CM

## 2015-01-02 DIAGNOSIS — N2889 Other specified disorders of kidney and ureter: Secondary | ICD-10-CM

## 2015-01-02 DIAGNOSIS — N185 Chronic kidney disease, stage 5: Secondary | ICD-10-CM

## 2015-01-02 MED ORDER — METOPROLOL SUCCINATE ER 25 MG PO TB24: 25.0000 mg | ORAL_TABLET | Freq: Every day | ORAL | Status: DC

## 2015-01-02 NOTE — Addendum Note (Signed)
Addended by: Laurine Blazer on: 01/02/2015 11:43 AM   Modules accepted: Orders

## 2015-01-02 NOTE — Progress Notes (Signed)
Patient ID: Corey Riley, male   DOB: [birth year], 79 y.o.   MRN: [id number]      SUBJECTIVE: The patient is here to followup for coronary artery disease, hypertension, hyperlipidemia and aortic stenosis. He also has CKD stage 5 and began hemodialysis in late March 2016. He follows with Dr. Lowanda Foster (nephrology).  In summary, he has a history of coronary artery disease status post drug eluting stent placement to the left anterior descending in August 2003. He also has hypothyroidism and diabetes mellitus.  An echocardiogram performed on 02/09/2013 showed normal left ventricular systolic function, EF 0000000, normal regional wall motion, grade 1 diastolic dysfunction, mild to moderate concentric LVH, moderate to severe left atrial enlargement, mild aortic stenosis with a mean gradient of 7 mm mercury, and trivial aortic regurgitation.   Nuclear stress testing on 04/19/14 demonstrated myocardial scar in the inferior and inferolateral walls with no evidence of ischemia. Mild inferior wall hypokinesis was noted, calculated LVEF 65%.  Denies chest pain and shortness of breath. Feels fatigued on dialysis days (M,W,F) but otherwise feels ok.  Took wife and grandchildren to [place] this past August.   ECG performed in the office today shows normal sinus rhythm with right bundle branch block, left anterior fascicular block, and nonspecific T wave abnormality.  Review of Systems: As per "subjective", otherwise negative.  Allergies  Allergen Reactions  . Codeine Nausea Only  . Sulfa Antibiotics Nausea Only    Current Outpatient Prescriptions  Medication Sig Dispense Refill  . ammonium lactate (LAC-HYDRIN) 12 % lotion Apply 1 application topically as needed (itchy skin). Apply to back and legs    . aspirin EC 81 MG tablet Take 81 mg by mouth at bedtime.    . Calcium Acetate 667 MG TABS Take 1 tablet by mouth 3 (three) times daily.    . Cholecalciferol (VITAMIN D) 2000 UNITS tablet Take  2,000 Units by mouth daily.    . furosemide (LASIX) 20 MG tablet Take 20 mg by mouth 3 (three) times daily.     . hydrALAZINE (APRESOLINE) 100 MG tablet Take 100 mg by mouth 2 (two) times daily.     . insulin glargine (LANTUS SOLOSTAR) 100 UNIT/ML injection Inject 15 Units into the skin 2 (two) times daily.     . isosorbide mononitrate (IMDUR) 60 MG 24 hr tablet Take 60 mg by mouth daily.    Marland Kitchen levothyroxine (SYNTHROID, LEVOTHROID) 112 MCG tablet Take 112 mcg by mouth daily.    . metoprolol succinate (TOPROL XL) 50 MG 24 hr tablet Take 1 tablet (50 mg total) by mouth daily. Take with or immediately following a meal. 30 tablet 6  . NIFEdipine (ADALAT CC) 90 MG 24 hr tablet Take 90 mg by mouth daily.    . simvastatin (ZOCOR) 80 MG tablet Take 40 mg by mouth at bedtime.      No current facility-administered medications for this visit.   Facility-Administered Medications Ordered in Other Visits  Medication Dose Route Frequency Provider Last Rate Last Dose  . cyclopentolate-phenylephrine (CYCLOMYDRYL) 0.2-1 % ophthalmic solution 1 drop  1 drop Right Eye Once Williams Che, MD      . ketorolac (ACULAR) 0.5 % ophthalmic solution 1 drop  1 drop Right Eye Once Williams Che, MD        Past Medical History  Diagnosis Date  . Essential hypertension, benign   . Type 2 diabetes mellitus ([place])   . Mixed hyperlipidemia   . Hypothyroidism   . Coronary  atherosclerosis of native coronary artery     DES LAD 8/03  . Aortic stenosis     Mild  . Anemia   . Anginal pain ([place])     "a little."   . Heart murmur     "faint"  . Shortness of breath     with exertion  . Chronic kidney disease     pt states 30% kidney function started dialysis 04/23/14 T/Th/Sa  . Pneumonia   . Constipation     Past Surgical History  Procedure Laterality Date  . Rotator cuff repair Left     left  . Nephrectomy      Left   . Cataract extraction w/phaco  02/07/2012    Procedure: CATARACT EXTRACTION PHACO AND  INTRAOCULAR LENS PLACEMENT (IOC);  Surgeon: Williams Che, MD;  Location: [facility];  Service: Ophthalmology;  Laterality: Right;  CDE:24.87  . Cataract extraction w/phaco Left 04/17/2012    Procedure: CATARACT EXTRACTION PHACO AND INTRAOCULAR LENS PLACEMENT (IOC);  Surgeon: Williams Che, MD;  Location: [facility];  Service: Ophthalmology;  Laterality: Left;  CDE=20.75  . Jaw implant    . Insertion of dialysis catheter Right 04/22/2014    Procedure: INSERTION OF DIALYSIS CATHETER RIGHT INTERNAL JUGULAR VEIN;  Surgeon: Rosetta Posner, MD;  Location: [facility];  Service: Vascular;  Laterality: Right;  . Vasectomy    . Colonoscopy    . Cyst removed      from back  . Cardiac catheterization    . Av fistula placement Right 05/08/2014    Procedure: ARTERIOVENOUS (AV) FISTULA CREATION-RIGHT;  Surgeon: Mal Misty, MD;  [facility];  Service: Vascular;  Laterality: Right;    Social History   Social History  . Marital Status: Married    Spouse Name: N/A  . Number of Children: N/A  . Years of Education: N/A   Occupational History  . Not on file.   Social History Main Topics  . Smoking status: Former Smoker -- 1.50 packs/day for 11 years    Types: Cigarettes    Start date: 08/14/1955    Quit date: 12/15/1966  . Smokeless tobacco: Never Used  . Alcohol Use: No  . Drug Use: No  . Sexual Activity: Not on file   Other Topics Concern  . Not on file   Social History Narrative     Filed Vitals:   01/02/15 1056  BP: 161/66  Pulse: 57  Height: 5\' 10"  (1.778 m)  Weight: 154 lb (69.854 kg)    PHYSICAL EXAM General: NAD Neck: No JVD, no thyromegaly. Lungs: Clear to auscultation bilaterally with normal respiratory effort. CV: Nondisplaced PMI. Regular rate and rhythm, normal S1/S2, no [id number], II/VI systolic murmur along left sternal border and 3/6 apical holosystolic murmur. Trace peri-ankle edema b/l. Bilateral carotid bruits. Abdomen: Soft, nontender, no distention.  Neurologic:  Alert and oriented x 3.  Psych: Normal affect. Skin: Normal. Musculoskeletal: Normal range of motion, no gross deformities. Extremities: No clubbing or cyanosis.   ECG: Most recent ECG reviewed.      ASSESSMENT AND PLAN: 1. CAD with prior stent placement: Nuclear stress test results noted above with myocardial scar and no ischemia. Continue aspirin, Imdur, metoprolol succinate (reduce to 25 mg daily), and simvastatin. He has normal left ventricular systolic function and grade 1 diastolic dysfunction.   2. Essential HTN: Elevated today but he says it has been well controlled on dialysis days. Continue hydralazine and nifedipine. Has one kidney. If remains elevated, consider  addition of clonidine or increase hydralazine frequency to tid.  3. Aortic stenosis: Mild with a mean gradient of 7 mmHg. Continue to monitor with surveillance echocardiography.   4. Hyperlipidemia: Continue simvastatin 40 mg daily. Lipids reviewed at prior visit.  5. CKD, stage 5: On hemodialysis. Will continue hydralazine and nifedipine to control BP. Unable to use ACEI/ARB for this reason.   6. Carotid bruit: Mild to moderate plaque disease bilaterally in 12/2013. Will repeat Dopplers in 2017.  Dispo: f/u 1 year.  Kate Sable, M.D., F.A.C.C.

## 2015-01-02 NOTE — Patient Instructions (Signed)
   Decrease Toprol XL to 25mg  daily.  May break your 50mg  tab in half till finish current supply.  New 25mg  tab sent to [facility] for 90 day supply today. Continue all other medications.   Your physician wants you to follow up in:  1 year.  You will receive a reminder letter in the mail one-two months in advance.  If you don't receive a letter, please call our office to schedule the follow up appointment

## 2015-01-06 ENCOUNTER — Other Ambulatory Visit: Payer: Self-pay | Admitting: Cardiovascular Disease

## 2015-01-31 ENCOUNTER — Other Ambulatory Visit: Payer: Self-pay | Admitting: Urology

## 2015-01-31 ENCOUNTER — Ambulatory Visit (INDEPENDENT_AMBULATORY_CARE_PROVIDER_SITE_OTHER): Payer: Medicare Other | Admitting: Urology

## 2015-01-31 DIAGNOSIS — R972 Elevated prostate specific antigen [PSA]: Secondary | ICD-10-CM | POA: Diagnosis not present

## 2015-01-31 DIAGNOSIS — N403 Nodular prostate with lower urinary tract symptoms: Secondary | ICD-10-CM

## 2015-02-01 DIAGNOSIS — N186 End stage renal disease: Secondary | ICD-10-CM | POA: Diagnosis not present

## 2015-02-01 DIAGNOSIS — Z992 Dependence on renal dialysis: Secondary | ICD-10-CM | POA: Diagnosis not present

## 2015-02-03 DIAGNOSIS — D509 Iron deficiency anemia, unspecified: Secondary | ICD-10-CM | POA: Diagnosis not present

## 2015-02-03 DIAGNOSIS — N2581 Secondary hyperparathyroidism of renal origin: Secondary | ICD-10-CM | POA: Diagnosis not present

## 2015-02-03 DIAGNOSIS — Z992 Dependence on renal dialysis: Secondary | ICD-10-CM | POA: Diagnosis not present

## 2015-02-03 DIAGNOSIS — D631 Anemia in chronic kidney disease: Secondary | ICD-10-CM | POA: Diagnosis not present

## 2015-02-03 DIAGNOSIS — N186 End stage renal disease: Secondary | ICD-10-CM | POA: Diagnosis not present

## 2015-02-04 ENCOUNTER — Ambulatory Visit (HOSPITAL_COMMUNITY)
Admission: RE | Admit: 2015-02-04 | Discharge: 2015-02-04 | Disposition: A | Discharge status: Home / Self Care | Payer: Medicare Other | Source: Ambulatory Visit | Attending: Urology | Admitting: Urology

## 2015-02-04 ENCOUNTER — Other Ambulatory Visit: Payer: Self-pay | Admitting: Cardiovascular Disease

## 2015-02-04 DIAGNOSIS — R972 Elevated prostate specific antigen [PSA]: Secondary | ICD-10-CM | POA: Insufficient documentation

## 2015-02-04 DIAGNOSIS — N403 Nodular prostate with lower urinary tract symptoms: Secondary | ICD-10-CM | POA: Diagnosis not present

## 2015-02-04 DIAGNOSIS — I6523 Occlusion and stenosis of bilateral carotid arteries: Secondary | ICD-10-CM

## 2015-02-04 MED ORDER — LIDOCAINE HCL (PF) 2 % IJ SOLN
INTRAMUSCULAR | Status: AC
  Administered 2015-02-04: 10 mL
  Filled 2015-02-04: qty 10

## 2015-02-04 MED ORDER — GENTAMICIN SULFATE 40 MG/ML IJ SOLN
160.0000 mg | Freq: Once | INTRAMUSCULAR | Status: AC
  Administered 2015-02-04: 160 mg via INTRAMUSCULAR

## 2015-02-04 MED ORDER — GENTAMICIN SULFATE 40 MG/ML IJ SOLN
INTRAMUSCULAR | Status: AC
  Administered 2015-02-04: 160 mg via INTRAMUSCULAR
  Filled 2015-02-04: qty 4

## 2015-02-04 MED ORDER — LIDOCAINE HCL (PF) 2 % IJ SOLN
10.0000 mL | Freq: Once | INTRAMUSCULAR | Status: AC
  Administered 2015-02-04: 10 mL

## 2015-02-04 NOTE — Discharge Instructions (Signed)
Transrectal Ultrasound-Guided Biopsy °A transrectal ultrasound-guided biopsy is a procedure to take samples of tissue from your prostate. Ultrasound images are used to guide the procedure. It is usually done to check the prostate gland for cancer. °BEFORE THE PROCEDURE °· Do not eat or drink after midnight on the night before your procedure. °· Take medicines as your doctor tells you. °· Your doctor may have you stop taking some medicines 5-7 days before the procedure. °· You will be given an enema before your procedure. During an enema, a liquid is put into your butt (rectum) to clear out waste. °· You may have lab tests the day of your procedure. °· Make plans to have someone drive you home. °PROCEDURE °· You will be given medicine to help you relax before the procedure. An IV tube will be put into one of your veins. It will be used to give fluids and medicine. °· You will be given medicine to reduce the risk of infection (antibiotic). °· You will be placed on your side. °· A probe with gel will be put in your butt. This is used to take pictures of your prostate and the area around it. °· A medicine to numb the area is put into your prostate. °· A biopsy needle is then inserted and guided to your prostate. °· Samples of prostate tissue are taken. The needle is removed. °· The samples are sent to a lab to be checked. Results are usually back in 2-3 days. °AFTER THE PROCEDURE °· You will be taken to a room where you will be watched until you are doing okay. °· You may have some pain in the area around your butt. You will be given medicines for this. °· You may be able to go home the same day. Sometimes, an overnight stay in the hospital is needed. °  °This information is not intended to replace advice given to you by your health care provider. Make sure you discuss any questions you have with your health care provider. °  °Document Released: 01/06/2009 Document Revised: 01/23/2013 Document Reviewed:  09/06/2012 °Elsevier Interactive Patient Education ©2016 [employer] ° °

## 2015-02-05 DIAGNOSIS — N2581 Secondary hyperparathyroidism of renal origin: Secondary | ICD-10-CM | POA: Diagnosis not present

## 2015-02-05 DIAGNOSIS — N186 End stage renal disease: Secondary | ICD-10-CM | POA: Diagnosis not present

## 2015-02-05 DIAGNOSIS — Z992 Dependence on renal dialysis: Secondary | ICD-10-CM | POA: Diagnosis not present

## 2015-02-05 DIAGNOSIS — D509 Iron deficiency anemia, unspecified: Secondary | ICD-10-CM | POA: Diagnosis not present

## 2015-02-05 DIAGNOSIS — D631 Anemia in chronic kidney disease: Secondary | ICD-10-CM | POA: Diagnosis not present

## 2015-02-07 DIAGNOSIS — D631 Anemia in chronic kidney disease: Secondary | ICD-10-CM | POA: Diagnosis not present

## 2015-02-07 DIAGNOSIS — D509 Iron deficiency anemia, unspecified: Secondary | ICD-10-CM | POA: Diagnosis not present

## 2015-02-07 DIAGNOSIS — N2581 Secondary hyperparathyroidism of renal origin: Secondary | ICD-10-CM | POA: Diagnosis not present

## 2015-02-07 DIAGNOSIS — N186 End stage renal disease: Secondary | ICD-10-CM | POA: Diagnosis not present

## 2015-02-07 DIAGNOSIS — Z992 Dependence on renal dialysis: Secondary | ICD-10-CM | POA: Diagnosis not present

## 2015-02-10 DIAGNOSIS — D631 Anemia in chronic kidney disease: Secondary | ICD-10-CM | POA: Diagnosis not present

## 2015-02-10 DIAGNOSIS — N186 End stage renal disease: Secondary | ICD-10-CM | POA: Diagnosis not present

## 2015-02-10 DIAGNOSIS — D509 Iron deficiency anemia, unspecified: Secondary | ICD-10-CM | POA: Diagnosis not present

## 2015-02-10 DIAGNOSIS — Z992 Dependence on renal dialysis: Secondary | ICD-10-CM | POA: Diagnosis not present

## 2015-02-10 DIAGNOSIS — N2581 Secondary hyperparathyroidism of renal origin: Secondary | ICD-10-CM | POA: Diagnosis not present

## 2015-02-12 DIAGNOSIS — Z992 Dependence on renal dialysis: Secondary | ICD-10-CM | POA: Diagnosis not present

## 2015-02-12 DIAGNOSIS — D631 Anemia in chronic kidney disease: Secondary | ICD-10-CM | POA: Diagnosis not present

## 2015-02-12 DIAGNOSIS — D509 Iron deficiency anemia, unspecified: Secondary | ICD-10-CM | POA: Diagnosis not present

## 2015-02-12 DIAGNOSIS — N2581 Secondary hyperparathyroidism of renal origin: Secondary | ICD-10-CM | POA: Diagnosis not present

## 2015-02-12 DIAGNOSIS — N186 End stage renal disease: Secondary | ICD-10-CM | POA: Diagnosis not present

## 2015-02-13 ENCOUNTER — Ambulatory Visit: Payer: Medicare Other

## 2015-02-13 DIAGNOSIS — I6523 Occlusion and stenosis of bilateral carotid arteries: Secondary | ICD-10-CM | POA: Diagnosis not present

## 2015-02-14 DIAGNOSIS — D509 Iron deficiency anemia, unspecified: Secondary | ICD-10-CM | POA: Diagnosis not present

## 2015-02-14 DIAGNOSIS — D631 Anemia in chronic kidney disease: Secondary | ICD-10-CM | POA: Diagnosis not present

## 2015-02-14 DIAGNOSIS — N2581 Secondary hyperparathyroidism of renal origin: Secondary | ICD-10-CM | POA: Diagnosis not present

## 2015-02-14 DIAGNOSIS — Z992 Dependence on renal dialysis: Secondary | ICD-10-CM | POA: Diagnosis not present

## 2015-02-14 DIAGNOSIS — N186 End stage renal disease: Secondary | ICD-10-CM | POA: Diagnosis not present

## 2015-02-17 DIAGNOSIS — Z992 Dependence on renal dialysis: Secondary | ICD-10-CM | POA: Diagnosis not present

## 2015-02-17 DIAGNOSIS — D631 Anemia in chronic kidney disease: Secondary | ICD-10-CM | POA: Diagnosis not present

## 2015-02-17 DIAGNOSIS — D509 Iron deficiency anemia, unspecified: Secondary | ICD-10-CM | POA: Diagnosis not present

## 2015-02-17 DIAGNOSIS — N2581 Secondary hyperparathyroidism of renal origin: Secondary | ICD-10-CM | POA: Diagnosis not present

## 2015-02-17 DIAGNOSIS — N186 End stage renal disease: Secondary | ICD-10-CM | POA: Diagnosis not present

## 2015-02-19 DIAGNOSIS — N186 End stage renal disease: Secondary | ICD-10-CM | POA: Diagnosis not present

## 2015-02-19 DIAGNOSIS — D631 Anemia in chronic kidney disease: Secondary | ICD-10-CM | POA: Diagnosis not present

## 2015-02-19 DIAGNOSIS — Z992 Dependence on renal dialysis: Secondary | ICD-10-CM | POA: Diagnosis not present

## 2015-02-19 DIAGNOSIS — D509 Iron deficiency anemia, unspecified: Secondary | ICD-10-CM | POA: Diagnosis not present

## 2015-02-19 DIAGNOSIS — N2581 Secondary hyperparathyroidism of renal origin: Secondary | ICD-10-CM | POA: Diagnosis not present

## 2015-02-20 DIAGNOSIS — E039 Hypothyroidism, unspecified: Secondary | ICD-10-CM | POA: Diagnosis not present

## 2015-02-21 DIAGNOSIS — N2581 Secondary hyperparathyroidism of renal origin: Secondary | ICD-10-CM | POA: Diagnosis not present

## 2015-02-21 DIAGNOSIS — D509 Iron deficiency anemia, unspecified: Secondary | ICD-10-CM | POA: Diagnosis not present

## 2015-02-21 DIAGNOSIS — N186 End stage renal disease: Secondary | ICD-10-CM | POA: Diagnosis not present

## 2015-02-21 DIAGNOSIS — Z992 Dependence on renal dialysis: Secondary | ICD-10-CM | POA: Diagnosis not present

## 2015-02-21 DIAGNOSIS — D631 Anemia in chronic kidney disease: Secondary | ICD-10-CM | POA: Diagnosis not present

## 2015-02-24 DIAGNOSIS — Z992 Dependence on renal dialysis: Secondary | ICD-10-CM | POA: Diagnosis not present

## 2015-02-24 DIAGNOSIS — N2581 Secondary hyperparathyroidism of renal origin: Secondary | ICD-10-CM | POA: Diagnosis not present

## 2015-02-24 DIAGNOSIS — D631 Anemia in chronic kidney disease: Secondary | ICD-10-CM | POA: Diagnosis not present

## 2015-02-24 DIAGNOSIS — D509 Iron deficiency anemia, unspecified: Secondary | ICD-10-CM | POA: Diagnosis not present

## 2015-02-24 DIAGNOSIS — N186 End stage renal disease: Secondary | ICD-10-CM | POA: Diagnosis not present

## 2015-02-26 DIAGNOSIS — Z992 Dependence on renal dialysis: Secondary | ICD-10-CM | POA: Diagnosis not present

## 2015-02-26 DIAGNOSIS — N186 End stage renal disease: Secondary | ICD-10-CM | POA: Diagnosis not present

## 2015-02-28 DIAGNOSIS — Z992 Dependence on renal dialysis: Secondary | ICD-10-CM | POA: Diagnosis not present

## 2015-02-28 DIAGNOSIS — N186 End stage renal disease: Secondary | ICD-10-CM | POA: Diagnosis not present

## 2015-03-03 DIAGNOSIS — N186 End stage renal disease: Secondary | ICD-10-CM | POA: Diagnosis not present

## 2015-03-03 DIAGNOSIS — Z992 Dependence on renal dialysis: Secondary | ICD-10-CM | POA: Diagnosis not present

## 2015-03-03 DIAGNOSIS — D509 Iron deficiency anemia, unspecified: Secondary | ICD-10-CM | POA: Diagnosis not present

## 2015-03-03 DIAGNOSIS — N2581 Secondary hyperparathyroidism of renal origin: Secondary | ICD-10-CM | POA: Diagnosis not present

## 2015-03-03 DIAGNOSIS — D631 Anemia in chronic kidney disease: Secondary | ICD-10-CM | POA: Diagnosis not present

## 2015-03-04 DIAGNOSIS — Z992 Dependence on renal dialysis: Secondary | ICD-10-CM | POA: Diagnosis not present

## 2015-03-04 DIAGNOSIS — N186 End stage renal disease: Secondary | ICD-10-CM | POA: Diagnosis not present

## 2015-03-05 DIAGNOSIS — D631 Anemia in chronic kidney disease: Secondary | ICD-10-CM | POA: Diagnosis not present

## 2015-03-05 DIAGNOSIS — N186 End stage renal disease: Secondary | ICD-10-CM | POA: Diagnosis not present

## 2015-03-05 DIAGNOSIS — Z992 Dependence on renal dialysis: Secondary | ICD-10-CM | POA: Diagnosis not present

## 2015-03-05 DIAGNOSIS — N2581 Secondary hyperparathyroidism of renal origin: Secondary | ICD-10-CM | POA: Diagnosis not present

## 2015-03-05 DIAGNOSIS — D509 Iron deficiency anemia, unspecified: Secondary | ICD-10-CM | POA: Diagnosis not present

## 2015-03-07 DIAGNOSIS — D509 Iron deficiency anemia, unspecified: Secondary | ICD-10-CM | POA: Diagnosis not present

## 2015-03-07 DIAGNOSIS — Z992 Dependence on renal dialysis: Secondary | ICD-10-CM | POA: Diagnosis not present

## 2015-03-07 DIAGNOSIS — D631 Anemia in chronic kidney disease: Secondary | ICD-10-CM | POA: Diagnosis not present

## 2015-03-07 DIAGNOSIS — N186 End stage renal disease: Secondary | ICD-10-CM | POA: Diagnosis not present

## 2015-03-07 DIAGNOSIS — N2581 Secondary hyperparathyroidism of renal origin: Secondary | ICD-10-CM | POA: Diagnosis not present

## 2015-03-10 DIAGNOSIS — D631 Anemia in chronic kidney disease: Secondary | ICD-10-CM | POA: Diagnosis not present

## 2015-03-10 DIAGNOSIS — D509 Iron deficiency anemia, unspecified: Secondary | ICD-10-CM | POA: Diagnosis not present

## 2015-03-10 DIAGNOSIS — N186 End stage renal disease: Secondary | ICD-10-CM | POA: Diagnosis not present

## 2015-03-10 DIAGNOSIS — N2581 Secondary hyperparathyroidism of renal origin: Secondary | ICD-10-CM | POA: Diagnosis not present

## 2015-03-10 DIAGNOSIS — Z992 Dependence on renal dialysis: Secondary | ICD-10-CM | POA: Diagnosis not present

## 2015-03-12 DIAGNOSIS — Z992 Dependence on renal dialysis: Secondary | ICD-10-CM | POA: Diagnosis not present

## 2015-03-12 DIAGNOSIS — D509 Iron deficiency anemia, unspecified: Secondary | ICD-10-CM | POA: Diagnosis not present

## 2015-03-12 DIAGNOSIS — D631 Anemia in chronic kidney disease: Secondary | ICD-10-CM | POA: Diagnosis not present

## 2015-03-12 DIAGNOSIS — N186 End stage renal disease: Secondary | ICD-10-CM | POA: Diagnosis not present

## 2015-03-12 DIAGNOSIS — N2581 Secondary hyperparathyroidism of renal origin: Secondary | ICD-10-CM | POA: Diagnosis not present

## 2015-03-14 DIAGNOSIS — D509 Iron deficiency anemia, unspecified: Secondary | ICD-10-CM | POA: Diagnosis not present

## 2015-03-14 DIAGNOSIS — N186 End stage renal disease: Secondary | ICD-10-CM | POA: Diagnosis not present

## 2015-03-14 DIAGNOSIS — Z992 Dependence on renal dialysis: Secondary | ICD-10-CM | POA: Diagnosis not present

## 2015-03-14 DIAGNOSIS — D631 Anemia in chronic kidney disease: Secondary | ICD-10-CM | POA: Diagnosis not present

## 2015-03-14 DIAGNOSIS — N2581 Secondary hyperparathyroidism of renal origin: Secondary | ICD-10-CM | POA: Diagnosis not present

## 2015-03-17 DIAGNOSIS — D509 Iron deficiency anemia, unspecified: Secondary | ICD-10-CM | POA: Diagnosis not present

## 2015-03-17 DIAGNOSIS — N2581 Secondary hyperparathyroidism of renal origin: Secondary | ICD-10-CM | POA: Diagnosis not present

## 2015-03-17 DIAGNOSIS — N186 End stage renal disease: Secondary | ICD-10-CM | POA: Diagnosis not present

## 2015-03-17 DIAGNOSIS — D631 Anemia in chronic kidney disease: Secondary | ICD-10-CM | POA: Diagnosis not present

## 2015-03-17 DIAGNOSIS — Z992 Dependence on renal dialysis: Secondary | ICD-10-CM | POA: Diagnosis not present

## 2015-03-18 DIAGNOSIS — Z1211 Encounter for screening for malignant neoplasm of colon: Secondary | ICD-10-CM | POA: Diagnosis not present

## 2015-03-18 DIAGNOSIS — Z992 Dependence on renal dialysis: Secondary | ICD-10-CM | POA: Diagnosis not present

## 2015-03-18 DIAGNOSIS — I2510 Atherosclerotic heart disease of native coronary artery without angina pectoris: Secondary | ICD-10-CM | POA: Diagnosis not present

## 2015-03-18 DIAGNOSIS — E119 Type 2 diabetes mellitus without complications: Secondary | ICD-10-CM | POA: Diagnosis not present

## 2015-03-18 DIAGNOSIS — I120 Hypertensive chronic kidney disease with stage 5 chronic kidney disease or end stage renal disease: Secondary | ICD-10-CM | POA: Diagnosis not present

## 2015-03-18 DIAGNOSIS — Z905 Acquired absence of kidney: Secondary | ICD-10-CM | POA: Diagnosis not present

## 2015-03-18 DIAGNOSIS — I10 Essential (primary) hypertension: Secondary | ICD-10-CM | POA: Diagnosis not present

## 2015-03-18 DIAGNOSIS — Z7982 Long term (current) use of aspirin: Secondary | ICD-10-CM | POA: Diagnosis not present

## 2015-03-18 DIAGNOSIS — Z886 Allergy status to analgesic agent status: Secondary | ICD-10-CM | POA: Diagnosis not present

## 2015-03-18 DIAGNOSIS — N186 End stage renal disease: Secondary | ICD-10-CM | POA: Diagnosis not present

## 2015-03-18 DIAGNOSIS — D123 Benign neoplasm of transverse colon: Secondary | ICD-10-CM | POA: Diagnosis not present

## 2015-03-18 DIAGNOSIS — K635 Polyp of colon: Secondary | ICD-10-CM | POA: Diagnosis not present

## 2015-03-18 DIAGNOSIS — D122 Benign neoplasm of ascending colon: Secondary | ICD-10-CM | POA: Diagnosis not present

## 2015-03-18 DIAGNOSIS — D120 Benign neoplasm of cecum: Secondary | ICD-10-CM | POA: Diagnosis not present

## 2015-03-18 DIAGNOSIS — Z79899 Other long term (current) drug therapy: Secondary | ICD-10-CM | POA: Diagnosis not present

## 2015-03-18 DIAGNOSIS — D124 Benign neoplasm of descending colon: Secondary | ICD-10-CM | POA: Diagnosis not present

## 2015-03-18 DIAGNOSIS — Z882 Allergy status to sulfonamides status: Secondary | ICD-10-CM | POA: Diagnosis not present

## 2015-03-19 DIAGNOSIS — Z992 Dependence on renal dialysis: Secondary | ICD-10-CM | POA: Diagnosis not present

## 2015-03-19 DIAGNOSIS — N186 End stage renal disease: Secondary | ICD-10-CM | POA: Diagnosis not present

## 2015-03-19 DIAGNOSIS — N2581 Secondary hyperparathyroidism of renal origin: Secondary | ICD-10-CM | POA: Diagnosis not present

## 2015-03-19 DIAGNOSIS — D631 Anemia in chronic kidney disease: Secondary | ICD-10-CM | POA: Diagnosis not present

## 2015-03-19 DIAGNOSIS — D509 Iron deficiency anemia, unspecified: Secondary | ICD-10-CM | POA: Diagnosis not present

## 2015-03-20 DIAGNOSIS — E113293 Type 2 diabetes mellitus with mild nonproliferative diabetic retinopathy without macular edema, bilateral: Secondary | ICD-10-CM | POA: Diagnosis not present

## 2015-03-20 DIAGNOSIS — Z961 Presence of intraocular lens: Secondary | ICD-10-CM | POA: Diagnosis not present

## 2015-03-21 DIAGNOSIS — N186 End stage renal disease: Secondary | ICD-10-CM | POA: Diagnosis not present

## 2015-03-21 DIAGNOSIS — N2581 Secondary hyperparathyroidism of renal origin: Secondary | ICD-10-CM | POA: Diagnosis not present

## 2015-03-21 DIAGNOSIS — Z992 Dependence on renal dialysis: Secondary | ICD-10-CM | POA: Diagnosis not present

## 2015-03-21 DIAGNOSIS — D631 Anemia in chronic kidney disease: Secondary | ICD-10-CM | POA: Diagnosis not present

## 2015-03-21 DIAGNOSIS — D509 Iron deficiency anemia, unspecified: Secondary | ICD-10-CM | POA: Diagnosis not present

## 2015-03-24 DIAGNOSIS — Z992 Dependence on renal dialysis: Secondary | ICD-10-CM | POA: Diagnosis not present

## 2015-03-24 DIAGNOSIS — N186 End stage renal disease: Secondary | ICD-10-CM | POA: Diagnosis not present

## 2015-03-24 DIAGNOSIS — N2581 Secondary hyperparathyroidism of renal origin: Secondary | ICD-10-CM | POA: Diagnosis not present

## 2015-03-24 DIAGNOSIS — D631 Anemia in chronic kidney disease: Secondary | ICD-10-CM | POA: Diagnosis not present

## 2015-03-24 DIAGNOSIS — D509 Iron deficiency anemia, unspecified: Secondary | ICD-10-CM | POA: Diagnosis not present

## 2015-03-26 DIAGNOSIS — I2510 Atherosclerotic heart disease of native coronary artery without angina pectoris: Secondary | ICD-10-CM | POA: Diagnosis not present

## 2015-03-26 DIAGNOSIS — R0789 Other chest pain: Secondary | ICD-10-CM | POA: Diagnosis not present

## 2015-03-26 DIAGNOSIS — E1165 Type 2 diabetes mellitus with hyperglycemia: Secondary | ICD-10-CM | POA: Diagnosis not present

## 2015-03-26 DIAGNOSIS — R079 Chest pain, unspecified: Secondary | ICD-10-CM | POA: Diagnosis not present

## 2015-03-26 DIAGNOSIS — R0602 Shortness of breath: Secondary | ICD-10-CM | POA: Diagnosis not present

## 2015-03-26 DIAGNOSIS — I120 Hypertensive chronic kidney disease with stage 5 chronic kidney disease or end stage renal disease: Secondary | ICD-10-CM | POA: Diagnosis not present

## 2015-03-26 DIAGNOSIS — K625 Hemorrhage of anus and rectum: Secondary | ICD-10-CM | POA: Diagnosis not present

## 2015-03-26 DIAGNOSIS — D62 Acute posthemorrhagic anemia: Secondary | ICD-10-CM | POA: Diagnosis not present

## 2015-03-26 DIAGNOSIS — D649 Anemia, unspecified: Secondary | ICD-10-CM | POA: Diagnosis not present

## 2015-03-26 DIAGNOSIS — N186 End stage renal disease: Secondary | ICD-10-CM | POA: Diagnosis not present

## 2015-03-26 DIAGNOSIS — K91840 Postprocedural hemorrhage and hematoma of a digestive system organ or structure following a digestive system procedure: Secondary | ICD-10-CM | POA: Diagnosis not present

## 2015-03-27 DIAGNOSIS — E1122 Type 2 diabetes mellitus with diabetic chronic kidney disease: Secondary | ICD-10-CM | POA: Diagnosis present

## 2015-03-27 DIAGNOSIS — E1129 Type 2 diabetes mellitus with other diabetic kidney complication: Secondary | ICD-10-CM | POA: Diagnosis not present

## 2015-03-27 DIAGNOSIS — E039 Hypothyroidism, unspecified: Secondary | ICD-10-CM | POA: Diagnosis present

## 2015-03-27 DIAGNOSIS — Z79899 Other long term (current) drug therapy: Secondary | ICD-10-CM | POA: Diagnosis not present

## 2015-03-27 DIAGNOSIS — I2510 Atherosclerotic heart disease of native coronary artery without angina pectoris: Secondary | ICD-10-CM | POA: Diagnosis present

## 2015-03-27 DIAGNOSIS — N400 Enlarged prostate without lower urinary tract symptoms: Secondary | ICD-10-CM | POA: Diagnosis present

## 2015-03-27 DIAGNOSIS — I959 Hypotension, unspecified: Secondary | ICD-10-CM | POA: Diagnosis not present

## 2015-03-27 DIAGNOSIS — K91840 Postprocedural hemorrhage and hematoma of a digestive system organ or structure following a digestive system procedure: Secondary | ICD-10-CM | POA: Diagnosis present

## 2015-03-27 DIAGNOSIS — I120 Hypertensive chronic kidney disease with stage 5 chronic kidney disease or end stage renal disease: Secondary | ICD-10-CM | POA: Diagnosis present

## 2015-03-27 DIAGNOSIS — K625 Hemorrhage of anus and rectum: Secondary | ICD-10-CM | POA: Diagnosis present

## 2015-03-27 DIAGNOSIS — Z992 Dependence on renal dialysis: Secondary | ICD-10-CM | POA: Diagnosis not present

## 2015-03-27 DIAGNOSIS — D649 Anemia, unspecified: Secondary | ICD-10-CM | POA: Diagnosis not present

## 2015-03-27 DIAGNOSIS — I517 Cardiomegaly: Secondary | ICD-10-CM | POA: Diagnosis not present

## 2015-03-27 DIAGNOSIS — Z885 Allergy status to narcotic agent status: Secondary | ICD-10-CM | POA: Diagnosis not present

## 2015-03-27 DIAGNOSIS — R079 Chest pain, unspecified: Secondary | ICD-10-CM | POA: Diagnosis not present

## 2015-03-27 DIAGNOSIS — Z794 Long term (current) use of insulin: Secondary | ICD-10-CM | POA: Diagnosis not present

## 2015-03-27 DIAGNOSIS — Z87891 Personal history of nicotine dependence: Secondary | ICD-10-CM | POA: Diagnosis not present

## 2015-03-27 DIAGNOSIS — I058 Other rheumatic mitral valve diseases: Secondary | ICD-10-CM | POA: Diagnosis not present

## 2015-03-27 DIAGNOSIS — K922 Gastrointestinal hemorrhage, unspecified: Secondary | ICD-10-CM | POA: Diagnosis not present

## 2015-03-27 DIAGNOSIS — Z955 Presence of coronary angioplasty implant and graft: Secondary | ICD-10-CM | POA: Diagnosis not present

## 2015-03-27 DIAGNOSIS — D62 Acute posthemorrhagic anemia: Secondary | ICD-10-CM | POA: Diagnosis present

## 2015-03-27 DIAGNOSIS — N186 End stage renal disease: Secondary | ICD-10-CM | POA: Diagnosis present

## 2015-03-27 DIAGNOSIS — Z7982 Long term (current) use of aspirin: Secondary | ICD-10-CM | POA: Diagnosis not present

## 2015-03-27 DIAGNOSIS — Z882 Allergy status to sulfonamides status: Secondary | ICD-10-CM | POA: Diagnosis not present

## 2015-03-31 DIAGNOSIS — N2581 Secondary hyperparathyroidism of renal origin: Secondary | ICD-10-CM | POA: Diagnosis not present

## 2015-03-31 DIAGNOSIS — N186 End stage renal disease: Secondary | ICD-10-CM | POA: Diagnosis not present

## 2015-03-31 DIAGNOSIS — D509 Iron deficiency anemia, unspecified: Secondary | ICD-10-CM | POA: Diagnosis not present

## 2015-03-31 DIAGNOSIS — Z992 Dependence on renal dialysis: Secondary | ICD-10-CM | POA: Diagnosis not present

## 2015-03-31 DIAGNOSIS — D631 Anemia in chronic kidney disease: Secondary | ICD-10-CM | POA: Diagnosis not present

## 2015-04-01 DIAGNOSIS — Z992 Dependence on renal dialysis: Secondary | ICD-10-CM | POA: Diagnosis not present

## 2015-04-01 DIAGNOSIS — N186 End stage renal disease: Secondary | ICD-10-CM | POA: Diagnosis not present

## 2015-04-02 ENCOUNTER — Encounter: Payer: Self-pay | Admitting: *Deleted

## 2015-04-02 ENCOUNTER — Ambulatory Visit (INDEPENDENT_AMBULATORY_CARE_PROVIDER_SITE_OTHER): Payer: Medicare Other | Admitting: Cardiovascular Disease

## 2015-04-02 ENCOUNTER — Encounter: Payer: Self-pay | Admitting: Cardiovascular Disease

## 2015-04-02 VITALS — BP 140/52 | HR 64 | Ht 69.0 in | Wt 156.0 lb

## 2015-04-02 DIAGNOSIS — N186 End stage renal disease: Secondary | ICD-10-CM

## 2015-04-02 DIAGNOSIS — I25118 Atherosclerotic heart disease of native coronary artery with other forms of angina pectoris: Secondary | ICD-10-CM | POA: Diagnosis not present

## 2015-04-02 DIAGNOSIS — I6523 Occlusion and stenosis of bilateral carotid arteries: Secondary | ICD-10-CM

## 2015-04-02 DIAGNOSIS — I151 Hypertension secondary to other renal disorders: Secondary | ICD-10-CM

## 2015-04-02 DIAGNOSIS — D509 Iron deficiency anemia, unspecified: Secondary | ICD-10-CM | POA: Diagnosis not present

## 2015-04-02 DIAGNOSIS — Z992 Dependence on renal dialysis: Secondary | ICD-10-CM | POA: Diagnosis not present

## 2015-04-02 DIAGNOSIS — E785 Hyperlipidemia, unspecified: Secondary | ICD-10-CM

## 2015-04-02 DIAGNOSIS — I350 Nonrheumatic aortic (valve) stenosis: Secondary | ICD-10-CM | POA: Diagnosis not present

## 2015-04-02 DIAGNOSIS — N2581 Secondary hyperparathyroidism of renal origin: Secondary | ICD-10-CM | POA: Diagnosis not present

## 2015-04-02 DIAGNOSIS — N2889 Other specified disorders of kidney and ureter: Secondary | ICD-10-CM

## 2015-04-02 DIAGNOSIS — Z9289 Personal history of other medical treatment: Secondary | ICD-10-CM

## 2015-04-02 DIAGNOSIS — D631 Anemia in chronic kidney disease: Secondary | ICD-10-CM | POA: Diagnosis not present

## 2015-04-02 DIAGNOSIS — Z87898 Personal history of other specified conditions: Secondary | ICD-10-CM

## 2015-04-02 NOTE — Progress Notes (Signed)
Patient ID: Corey Riley, male   DOB: [birth year], 80 y.o.   MRN: [id number]      SUBJECTIVE: The patient presents for post hospitalization follow-up. He was recently hospitalized at [facility] and was discharged on 03/28/15. He presented with chest pain we troponins in the 0.2-0.3 range. His hemoglobin was 6.9. He was given 2 units of packed red blood cell transfusion with hemoglobin increasing to 8.8. Chest x-ray showed no acute cardiopulmonary process. The hospitalist contacted Dr. Harl Bowie who was felt that the elevated troponin was indicative of demand ischemia. Anemia felt secondary to bloody diarrhea post polypectomy.  He has ESRD and is on hemodialysis. He has a history of coronary artery disease status post drug eluting stent placement to the left anterior descending in August 2003  Nuclear stress testing on 04/19/14 demonstrated myocardial scar in the inferior and inferolateral walls with no evidence of ischemia. Mild inferior wall hypokinesis was noted, calculated LVEF 65%.  Echocardiogram performed on 03/28/15 demonstrated normal left ventricular systolic function, EF 123456, moderate concentric LVH, normal regional wall motion, with mitral annular calcification and moderate thickening of the right coronary cusp of the aortic valve. The mean gradient was 7 mmHg with a peak velocity of 1.83 m/s.  When I saw him on 01/02/15, he was doing well.  Today, he denies chest pain and shortness of breath. He just finished dialysis feels somewhat fatigued.  He is being evaluated for a kidney transplant at [facility].  Review of Systems: As per "subjective", otherwise negative.  Allergies  Allergen Reactions  . Codeine Nausea Only  . Sulfa Antibiotics Nausea Only    Current Outpatient Prescriptions  Medication Sig Dispense Refill  . ammonium lactate (LAC-HYDRIN) 12 % lotion Apply 1 application topically as needed (itchy skin). Apply to back and legs    . aspirin EC 81 MG tablet Take 81 mg by  mouth at bedtime.    . Calcium Acetate 667 MG TABS Take 1 tablet by mouth 2 (two) times daily.     . Cholecalciferol (VITAMIN D) 2000 UNITS tablet Take 2,000 Units by mouth daily.    . furosemide (LASIX) 20 MG tablet Take 20 mg by mouth 2 (two) times daily.     . hydrALAZINE (APRESOLINE) 100 MG tablet Take 100 mg by mouth 2 (two) times daily.     . insulin glargine (LANTUS SOLOSTAR) 100 UNIT/ML injection Inject 15 Units into the skin 2 (two) times daily.     . isosorbide mononitrate (IMDUR) 60 MG 24 hr tablet Take 60 mg by mouth daily.    Marland Kitchen levothyroxine (SYNTHROID, LEVOTHROID) 125 MCG tablet Take 125 mcg by mouth daily before breakfast.    . metoprolol succinate (TOPROL-XL) 25 MG 24 hr tablet Take 1 tablet (25 mg total) by mouth daily. 90 tablet 3  . multivitamin (RENA-VIT) TABS tablet Take 1 tablet by mouth daily.    Marland Kitchen NIFEdipine (ADALAT CC) 90 MG 24 hr tablet Take 90 mg by mouth daily.    . simvastatin (ZOCOR) 80 MG tablet Take 40 mg by mouth at bedtime.      No current facility-administered medications for this visit.   Facility-Administered Medications Ordered in Other Visits  Medication Dose Route Frequency Provider Last Rate Last Dose  . cyclopentolate-phenylephrine (CYCLOMYDRYL) 0.2-1 % ophthalmic solution 1 drop  1 drop Right Eye Once Williams Che, MD      . ketorolac (ACULAR) 0.5 % ophthalmic solution 1 drop  1 drop Right Eye Once Williams Che, MD  Past Medical History  Diagnosis Date  . Essential hypertension, benign   . Type 2 diabetes mellitus ([place])   . Mixed hyperlipidemia   . Hypothyroidism   . Coronary atherosclerosis of native coronary artery     DES LAD 8/03  . Aortic stenosis     Mild  . Anemia   . Anginal pain ([place])     "a little."   . Heart murmur     "faint"  . Shortness of breath     with exertion  . Chronic kidney disease     pt states 30% kidney function started dialysis 04/23/14 T/Th/Sa  . Pneumonia   . Constipation     Past Surgical  History  Procedure Laterality Date  . Rotator cuff repair Left     left  . Nephrectomy      Left   . Cataract extraction w/phaco  02/07/2012    Procedure: CATARACT EXTRACTION PHACO AND INTRAOCULAR LENS PLACEMENT (IOC);  Surgeon: Williams Che, MD;  Location: [facility];  Service: Ophthalmology;  Laterality: Right;  CDE:24.87  . Cataract extraction w/phaco Left 04/17/2012    Procedure: CATARACT EXTRACTION PHACO AND INTRAOCULAR LENS PLACEMENT (IOC);  Surgeon: Williams Che, MD;  Location: [facility];  Service: Ophthalmology;  Laterality: Left;  CDE=20.75  . Jaw implant    . Insertion of dialysis catheter Right 04/22/2014    Procedure: INSERTION OF DIALYSIS CATHETER RIGHT INTERNAL JUGULAR VEIN;  Surgeon: Rosetta Posner, MD;  Location: [facility];  Service: Vascular;  Laterality: Right;  . Vasectomy    . Colonoscopy    . Cyst removed      from back  . Cardiac catheterization    . Av fistula placement Right 05/08/2014    Procedure: ARTERIOVENOUS (AV) FISTULA CREATION-RIGHT;  Surgeon: Mal Misty, MD;  Location: [facility] OR;  Service: Vascular;  Laterality: Right;    Social History   Social History  . Marital Status: Married    Spouse Name: N/A  . Number of Children: N/A  . Years of Education: N/A   Occupational History  . Not on file.   Social History Main Topics  . Smoking status: Former Smoker -- 1.50 packs/day for 11 years    Types: Cigarettes    Start date: 08/14/1955    Quit date: 12/15/1966  . Smokeless tobacco: Never Used  . Alcohol Use: No  . Drug Use: No  . Sexual Activity: Not on file   Other Topics Concern  . Not on file   Social History Narrative     Filed Vitals:   04/02/15 1406  BP: 140/52  Pulse: 64  Height: 5\' 9"  (1.753 m)  Weight: 156 lb (70.761 kg)  SpO2: 97%    PHYSICAL EXAM General: NAD Neck: No JVD, no thyromegaly. Lungs: Clear to auscultation bilaterally with normal respiratory effort. CV: Nondisplaced PMI. Regular rate and rhythm, normal S1/S2, no  [id number], II/VI systolic murmur along left sternal border and 3/6 apical holosystolic murmur. Trace peri-ankle edema b/l. Bilateral carotid bruits. Abdomen: Soft, nontender, no distention.  Neurologic: Alert and oriented x 3.  Psych: Normal affect. Skin: Normal. Musculoskeletal: Normal range of motion, no gross deformities. Extremities: No clubbing or cyanosis.   ECG: Most recent ECG reviewed.      ASSESSMENT AND PLAN: 1. CAD with prior stent placement: Nuclear stress test results noted above with myocardial scar and no ischemia. Continue aspirin, Imdur, metoprolol succinate, and simvastatin. He has normal left ventricular systolic function and grade 1 diastolic  dysfunction.   2. Essential HTN: Reasonably controlled. No changes.  3. Aortic stenosis: Mild with a mean gradient of 7 mmHg. Continue to monitor with surveillance echocardiography. Will repeat in 2-3 years.  4. Hyperlipidemia: Continue simvastatin 40 mg daily. Lipids reviewed at prior visit.  5. CKD, stage 5: On hemodialysis. Will continue hydralazine and nifedipine to control BP. Unable to use ACEI/ARB for this reason.   6. Carotid bruit: Mild to moderate plaque disease bilaterally in 12/2013. Will repeat Dopplers in 2017.  Dispo: f/u 01/2016.  Time spent: 40 minutes, of which greater than 50% was spent reviewing symptoms, relevant blood tests and studies, and discussing management plan with the patient.   Kate Sable, M.D., F.A.C.C.

## 2015-04-02 NOTE — Patient Instructions (Signed)
Your physician recommends that you continue on your current medications as directed. Please refer to the Current Medication list given to you today. Your physician recommends that you schedule a follow-up appointment in: December 2017. You will receive a reminder letter in the mail in about 6 months reminding you to call and schedule your appointment. If you don't receive this letter, please contact our office.

## 2015-04-03 ENCOUNTER — Telehealth: Payer: Self-pay | Admitting: Cardiovascular Disease

## 2015-04-03 DIAGNOSIS — D126 Benign neoplasm of colon, unspecified: Secondary | ICD-10-CM | POA: Diagnosis not present

## 2015-04-03 NOTE — Telephone Encounter (Signed)
Forgot to ask Doctor for prescription for [facility]

## 2015-04-03 NOTE — Telephone Encounter (Signed)
Please advise if its okay to send new prescription for nitroglycerin.

## 2015-04-04 DIAGNOSIS — D509 Iron deficiency anemia, unspecified: Secondary | ICD-10-CM | POA: Diagnosis not present

## 2015-04-04 DIAGNOSIS — N186 End stage renal disease: Secondary | ICD-10-CM | POA: Diagnosis not present

## 2015-04-04 DIAGNOSIS — D631 Anemia in chronic kidney disease: Secondary | ICD-10-CM | POA: Diagnosis not present

## 2015-04-04 DIAGNOSIS — Z992 Dependence on renal dialysis: Secondary | ICD-10-CM | POA: Diagnosis not present

## 2015-04-04 DIAGNOSIS — N2581 Secondary hyperparathyroidism of renal origin: Secondary | ICD-10-CM | POA: Diagnosis not present

## 2015-04-04 MED ORDER — NITROGLYCERIN 0.4 MG SL SUBL: 0.4000 mg | SUBLINGUAL_TABLET | SUBLINGUAL | Status: DC | PRN

## 2015-04-04 NOTE — Telephone Encounter (Signed)
Patient's wife informed

## 2015-04-04 NOTE — Telephone Encounter (Signed)
Yes, of course

## 2015-04-07 DIAGNOSIS — N186 End stage renal disease: Secondary | ICD-10-CM | POA: Diagnosis not present

## 2015-04-07 DIAGNOSIS — D509 Iron deficiency anemia, unspecified: Secondary | ICD-10-CM | POA: Diagnosis not present

## 2015-04-07 DIAGNOSIS — Z992 Dependence on renal dialysis: Secondary | ICD-10-CM | POA: Diagnosis not present

## 2015-04-07 DIAGNOSIS — N2581 Secondary hyperparathyroidism of renal origin: Secondary | ICD-10-CM | POA: Diagnosis not present

## 2015-04-07 DIAGNOSIS — D631 Anemia in chronic kidney disease: Secondary | ICD-10-CM | POA: Diagnosis not present

## 2015-04-09 DIAGNOSIS — N2581 Secondary hyperparathyroidism of renal origin: Secondary | ICD-10-CM | POA: Diagnosis not present

## 2015-04-09 DIAGNOSIS — D631 Anemia in chronic kidney disease: Secondary | ICD-10-CM | POA: Diagnosis not present

## 2015-04-09 DIAGNOSIS — D509 Iron deficiency anemia, unspecified: Secondary | ICD-10-CM | POA: Diagnosis not present

## 2015-04-09 DIAGNOSIS — N186 End stage renal disease: Secondary | ICD-10-CM | POA: Diagnosis not present

## 2015-04-09 DIAGNOSIS — Z992 Dependence on renal dialysis: Secondary | ICD-10-CM | POA: Diagnosis not present

## 2015-04-11 DIAGNOSIS — Z992 Dependence on renal dialysis: Secondary | ICD-10-CM | POA: Diagnosis not present

## 2015-04-11 DIAGNOSIS — N186 End stage renal disease: Secondary | ICD-10-CM | POA: Diagnosis not present

## 2015-04-11 DIAGNOSIS — N2581 Secondary hyperparathyroidism of renal origin: Secondary | ICD-10-CM | POA: Diagnosis not present

## 2015-04-11 DIAGNOSIS — D509 Iron deficiency anemia, unspecified: Secondary | ICD-10-CM | POA: Diagnosis not present

## 2015-04-11 DIAGNOSIS — D631 Anemia in chronic kidney disease: Secondary | ICD-10-CM | POA: Diagnosis not present

## 2015-04-14 DIAGNOSIS — D509 Iron deficiency anemia, unspecified: Secondary | ICD-10-CM | POA: Diagnosis not present

## 2015-04-14 DIAGNOSIS — D631 Anemia in chronic kidney disease: Secondary | ICD-10-CM | POA: Diagnosis not present

## 2015-04-14 DIAGNOSIS — N2581 Secondary hyperparathyroidism of renal origin: Secondary | ICD-10-CM | POA: Diagnosis not present

## 2015-04-14 DIAGNOSIS — Z992 Dependence on renal dialysis: Secondary | ICD-10-CM | POA: Diagnosis not present

## 2015-04-14 DIAGNOSIS — N186 End stage renal disease: Secondary | ICD-10-CM | POA: Diagnosis not present

## 2015-04-16 DIAGNOSIS — Z992 Dependence on renal dialysis: Secondary | ICD-10-CM | POA: Diagnosis not present

## 2015-04-16 DIAGNOSIS — N2581 Secondary hyperparathyroidism of renal origin: Secondary | ICD-10-CM | POA: Diagnosis not present

## 2015-04-16 DIAGNOSIS — D509 Iron deficiency anemia, unspecified: Secondary | ICD-10-CM | POA: Diagnosis not present

## 2015-04-16 DIAGNOSIS — D631 Anemia in chronic kidney disease: Secondary | ICD-10-CM | POA: Diagnosis not present

## 2015-04-16 DIAGNOSIS — N186 End stage renal disease: Secondary | ICD-10-CM | POA: Diagnosis not present

## 2015-04-18 DIAGNOSIS — Z992 Dependence on renal dialysis: Secondary | ICD-10-CM | POA: Diagnosis not present

## 2015-04-18 DIAGNOSIS — N2581 Secondary hyperparathyroidism of renal origin: Secondary | ICD-10-CM | POA: Diagnosis not present

## 2015-04-18 DIAGNOSIS — N186 End stage renal disease: Secondary | ICD-10-CM | POA: Diagnosis not present

## 2015-04-18 DIAGNOSIS — D631 Anemia in chronic kidney disease: Secondary | ICD-10-CM | POA: Diagnosis not present

## 2015-04-18 DIAGNOSIS — D509 Iron deficiency anemia, unspecified: Secondary | ICD-10-CM | POA: Diagnosis not present

## 2015-04-21 DIAGNOSIS — D509 Iron deficiency anemia, unspecified: Secondary | ICD-10-CM | POA: Diagnosis not present

## 2015-04-21 DIAGNOSIS — N2581 Secondary hyperparathyroidism of renal origin: Secondary | ICD-10-CM | POA: Diagnosis not present

## 2015-04-21 DIAGNOSIS — N186 End stage renal disease: Secondary | ICD-10-CM | POA: Diagnosis not present

## 2015-04-21 DIAGNOSIS — D631 Anemia in chronic kidney disease: Secondary | ICD-10-CM | POA: Diagnosis not present

## 2015-04-21 DIAGNOSIS — Z992 Dependence on renal dialysis: Secondary | ICD-10-CM | POA: Diagnosis not present

## 2015-04-23 DIAGNOSIS — N2581 Secondary hyperparathyroidism of renal origin: Secondary | ICD-10-CM | POA: Diagnosis not present

## 2015-04-23 DIAGNOSIS — N186 End stage renal disease: Secondary | ICD-10-CM | POA: Diagnosis not present

## 2015-04-23 DIAGNOSIS — D509 Iron deficiency anemia, unspecified: Secondary | ICD-10-CM | POA: Diagnosis not present

## 2015-04-23 DIAGNOSIS — D631 Anemia in chronic kidney disease: Secondary | ICD-10-CM | POA: Diagnosis not present

## 2015-04-23 DIAGNOSIS — Z992 Dependence on renal dialysis: Secondary | ICD-10-CM | POA: Diagnosis not present

## 2015-04-25 DIAGNOSIS — D631 Anemia in chronic kidney disease: Secondary | ICD-10-CM | POA: Diagnosis not present

## 2015-04-25 DIAGNOSIS — D509 Iron deficiency anemia, unspecified: Secondary | ICD-10-CM | POA: Diagnosis not present

## 2015-04-25 DIAGNOSIS — N2581 Secondary hyperparathyroidism of renal origin: Secondary | ICD-10-CM | POA: Diagnosis not present

## 2015-04-25 DIAGNOSIS — N186 End stage renal disease: Secondary | ICD-10-CM | POA: Diagnosis not present

## 2015-04-25 DIAGNOSIS — Z992 Dependence on renal dialysis: Secondary | ICD-10-CM | POA: Diagnosis not present

## 2015-04-28 DIAGNOSIS — N2581 Secondary hyperparathyroidism of renal origin: Secondary | ICD-10-CM | POA: Diagnosis not present

## 2015-04-28 DIAGNOSIS — N186 End stage renal disease: Secondary | ICD-10-CM | POA: Diagnosis not present

## 2015-04-28 DIAGNOSIS — D509 Iron deficiency anemia, unspecified: Secondary | ICD-10-CM | POA: Diagnosis not present

## 2015-04-28 DIAGNOSIS — Z992 Dependence on renal dialysis: Secondary | ICD-10-CM | POA: Diagnosis not present

## 2015-04-28 DIAGNOSIS — D631 Anemia in chronic kidney disease: Secondary | ICD-10-CM | POA: Diagnosis not present

## 2015-04-30 DIAGNOSIS — D509 Iron deficiency anemia, unspecified: Secondary | ICD-10-CM | POA: Diagnosis not present

## 2015-04-30 DIAGNOSIS — D631 Anemia in chronic kidney disease: Secondary | ICD-10-CM | POA: Diagnosis not present

## 2015-04-30 DIAGNOSIS — Z992 Dependence on renal dialysis: Secondary | ICD-10-CM | POA: Diagnosis not present

## 2015-04-30 DIAGNOSIS — N2581 Secondary hyperparathyroidism of renal origin: Secondary | ICD-10-CM | POA: Diagnosis not present

## 2015-04-30 DIAGNOSIS — N186 End stage renal disease: Secondary | ICD-10-CM | POA: Diagnosis not present

## 2015-05-02 DIAGNOSIS — N186 End stage renal disease: Secondary | ICD-10-CM | POA: Diagnosis not present

## 2015-05-02 DIAGNOSIS — D509 Iron deficiency anemia, unspecified: Secondary | ICD-10-CM | POA: Diagnosis not present

## 2015-05-02 DIAGNOSIS — Z992 Dependence on renal dialysis: Secondary | ICD-10-CM | POA: Diagnosis not present

## 2015-05-02 DIAGNOSIS — N2581 Secondary hyperparathyroidism of renal origin: Secondary | ICD-10-CM | POA: Diagnosis not present

## 2015-05-02 DIAGNOSIS — D631 Anemia in chronic kidney disease: Secondary | ICD-10-CM | POA: Diagnosis not present

## 2015-05-05 DIAGNOSIS — N2581 Secondary hyperparathyroidism of renal origin: Secondary | ICD-10-CM | POA: Diagnosis not present

## 2015-05-05 DIAGNOSIS — D509 Iron deficiency anemia, unspecified: Secondary | ICD-10-CM | POA: Diagnosis not present

## 2015-05-05 DIAGNOSIS — D631 Anemia in chronic kidney disease: Secondary | ICD-10-CM | POA: Diagnosis not present

## 2015-05-05 DIAGNOSIS — N186 End stage renal disease: Secondary | ICD-10-CM | POA: Diagnosis not present

## 2015-05-05 DIAGNOSIS — Z992 Dependence on renal dialysis: Secondary | ICD-10-CM | POA: Diagnosis not present

## 2015-05-07 DIAGNOSIS — D509 Iron deficiency anemia, unspecified: Secondary | ICD-10-CM | POA: Diagnosis not present

## 2015-05-07 DIAGNOSIS — Z992 Dependence on renal dialysis: Secondary | ICD-10-CM | POA: Diagnosis not present

## 2015-05-07 DIAGNOSIS — N186 End stage renal disease: Secondary | ICD-10-CM | POA: Diagnosis not present

## 2015-05-07 DIAGNOSIS — D631 Anemia in chronic kidney disease: Secondary | ICD-10-CM | POA: Diagnosis not present

## 2015-05-07 DIAGNOSIS — N2581 Secondary hyperparathyroidism of renal origin: Secondary | ICD-10-CM | POA: Diagnosis not present

## 2015-05-09 DIAGNOSIS — N2581 Secondary hyperparathyroidism of renal origin: Secondary | ICD-10-CM | POA: Diagnosis not present

## 2015-05-09 DIAGNOSIS — D509 Iron deficiency anemia, unspecified: Secondary | ICD-10-CM | POA: Diagnosis not present

## 2015-05-09 DIAGNOSIS — Z992 Dependence on renal dialysis: Secondary | ICD-10-CM | POA: Diagnosis not present

## 2015-05-09 DIAGNOSIS — D631 Anemia in chronic kidney disease: Secondary | ICD-10-CM | POA: Diagnosis not present

## 2015-05-09 DIAGNOSIS — N186 End stage renal disease: Secondary | ICD-10-CM | POA: Diagnosis not present

## 2015-05-12 DIAGNOSIS — D631 Anemia in chronic kidney disease: Secondary | ICD-10-CM | POA: Diagnosis not present

## 2015-05-12 DIAGNOSIS — D509 Iron deficiency anemia, unspecified: Secondary | ICD-10-CM | POA: Diagnosis not present

## 2015-05-12 DIAGNOSIS — Z992 Dependence on renal dialysis: Secondary | ICD-10-CM | POA: Diagnosis not present

## 2015-05-12 DIAGNOSIS — N2581 Secondary hyperparathyroidism of renal origin: Secondary | ICD-10-CM | POA: Diagnosis not present

## 2015-05-12 DIAGNOSIS — N186 End stage renal disease: Secondary | ICD-10-CM | POA: Diagnosis not present

## 2015-05-14 DIAGNOSIS — D631 Anemia in chronic kidney disease: Secondary | ICD-10-CM | POA: Diagnosis not present

## 2015-05-14 DIAGNOSIS — N186 End stage renal disease: Secondary | ICD-10-CM | POA: Diagnosis not present

## 2015-05-14 DIAGNOSIS — N2581 Secondary hyperparathyroidism of renal origin: Secondary | ICD-10-CM | POA: Diagnosis not present

## 2015-05-14 DIAGNOSIS — D509 Iron deficiency anemia, unspecified: Secondary | ICD-10-CM | POA: Diagnosis not present

## 2015-05-14 DIAGNOSIS — Z992 Dependence on renal dialysis: Secondary | ICD-10-CM | POA: Diagnosis not present

## 2015-05-16 DIAGNOSIS — Z992 Dependence on renal dialysis: Secondary | ICD-10-CM | POA: Diagnosis not present

## 2015-05-16 DIAGNOSIS — N2581 Secondary hyperparathyroidism of renal origin: Secondary | ICD-10-CM | POA: Diagnosis not present

## 2015-05-16 DIAGNOSIS — N186 End stage renal disease: Secondary | ICD-10-CM | POA: Diagnosis not present

## 2015-05-16 DIAGNOSIS — D509 Iron deficiency anemia, unspecified: Secondary | ICD-10-CM | POA: Diagnosis not present

## 2015-05-16 DIAGNOSIS — D631 Anemia in chronic kidney disease: Secondary | ICD-10-CM | POA: Diagnosis not present

## 2015-05-19 DIAGNOSIS — N2581 Secondary hyperparathyroidism of renal origin: Secondary | ICD-10-CM | POA: Diagnosis not present

## 2015-05-19 DIAGNOSIS — Z992 Dependence on renal dialysis: Secondary | ICD-10-CM | POA: Diagnosis not present

## 2015-05-19 DIAGNOSIS — D509 Iron deficiency anemia, unspecified: Secondary | ICD-10-CM | POA: Diagnosis not present

## 2015-05-19 DIAGNOSIS — N186 End stage renal disease: Secondary | ICD-10-CM | POA: Diagnosis not present

## 2015-05-19 DIAGNOSIS — D631 Anemia in chronic kidney disease: Secondary | ICD-10-CM | POA: Diagnosis not present

## 2015-05-21 DIAGNOSIS — D509 Iron deficiency anemia, unspecified: Secondary | ICD-10-CM | POA: Diagnosis not present

## 2015-05-21 DIAGNOSIS — Z992 Dependence on renal dialysis: Secondary | ICD-10-CM | POA: Diagnosis not present

## 2015-05-21 DIAGNOSIS — N2581 Secondary hyperparathyroidism of renal origin: Secondary | ICD-10-CM | POA: Diagnosis not present

## 2015-05-21 DIAGNOSIS — N186 End stage renal disease: Secondary | ICD-10-CM | POA: Diagnosis not present

## 2015-05-21 DIAGNOSIS — D631 Anemia in chronic kidney disease: Secondary | ICD-10-CM | POA: Diagnosis not present

## 2015-05-24 DIAGNOSIS — N186 End stage renal disease: Secondary | ICD-10-CM | POA: Diagnosis not present

## 2015-05-24 DIAGNOSIS — Z992 Dependence on renal dialysis: Secondary | ICD-10-CM | POA: Diagnosis not present

## 2015-05-26 DIAGNOSIS — N186 End stage renal disease: Secondary | ICD-10-CM | POA: Diagnosis not present

## 2015-05-26 DIAGNOSIS — D631 Anemia in chronic kidney disease: Secondary | ICD-10-CM | POA: Diagnosis not present

## 2015-05-26 DIAGNOSIS — Z992 Dependence on renal dialysis: Secondary | ICD-10-CM | POA: Diagnosis not present

## 2015-05-26 DIAGNOSIS — D509 Iron deficiency anemia, unspecified: Secondary | ICD-10-CM | POA: Diagnosis not present

## 2015-05-26 DIAGNOSIS — N2581 Secondary hyperparathyroidism of renal origin: Secondary | ICD-10-CM | POA: Diagnosis not present

## 2015-05-28 DIAGNOSIS — D631 Anemia in chronic kidney disease: Secondary | ICD-10-CM | POA: Diagnosis not present

## 2015-05-28 DIAGNOSIS — D509 Iron deficiency anemia, unspecified: Secondary | ICD-10-CM | POA: Diagnosis not present

## 2015-05-28 DIAGNOSIS — N186 End stage renal disease: Secondary | ICD-10-CM | POA: Diagnosis not present

## 2015-05-28 DIAGNOSIS — N2581 Secondary hyperparathyroidism of renal origin: Secondary | ICD-10-CM | POA: Diagnosis not present

## 2015-05-28 DIAGNOSIS — Z992 Dependence on renal dialysis: Secondary | ICD-10-CM | POA: Diagnosis not present

## 2015-05-30 DIAGNOSIS — D631 Anemia in chronic kidney disease: Secondary | ICD-10-CM | POA: Diagnosis not present

## 2015-05-30 DIAGNOSIS — N2581 Secondary hyperparathyroidism of renal origin: Secondary | ICD-10-CM | POA: Diagnosis not present

## 2015-05-30 DIAGNOSIS — D509 Iron deficiency anemia, unspecified: Secondary | ICD-10-CM | POA: Diagnosis not present

## 2015-05-30 DIAGNOSIS — Z992 Dependence on renal dialysis: Secondary | ICD-10-CM | POA: Diagnosis not present

## 2015-05-30 DIAGNOSIS — N186 End stage renal disease: Secondary | ICD-10-CM | POA: Diagnosis not present

## 2015-06-01 DIAGNOSIS — N186 End stage renal disease: Secondary | ICD-10-CM | POA: Diagnosis not present

## 2015-06-01 DIAGNOSIS — Z992 Dependence on renal dialysis: Secondary | ICD-10-CM | POA: Diagnosis not present

## 2015-06-02 DIAGNOSIS — D509 Iron deficiency anemia, unspecified: Secondary | ICD-10-CM | POA: Diagnosis not present

## 2015-06-02 DIAGNOSIS — N2581 Secondary hyperparathyroidism of renal origin: Secondary | ICD-10-CM | POA: Diagnosis not present

## 2015-06-02 DIAGNOSIS — N186 End stage renal disease: Secondary | ICD-10-CM | POA: Diagnosis not present

## 2015-06-02 DIAGNOSIS — Z992 Dependence on renal dialysis: Secondary | ICD-10-CM | POA: Diagnosis not present

## 2015-06-02 DIAGNOSIS — D631 Anemia in chronic kidney disease: Secondary | ICD-10-CM | POA: Diagnosis not present

## 2015-06-04 DIAGNOSIS — D631 Anemia in chronic kidney disease: Secondary | ICD-10-CM | POA: Diagnosis not present

## 2015-06-04 DIAGNOSIS — N186 End stage renal disease: Secondary | ICD-10-CM | POA: Diagnosis not present

## 2015-06-04 DIAGNOSIS — Z992 Dependence on renal dialysis: Secondary | ICD-10-CM | POA: Diagnosis not present

## 2015-06-04 DIAGNOSIS — N2581 Secondary hyperparathyroidism of renal origin: Secondary | ICD-10-CM | POA: Diagnosis not present

## 2015-06-04 DIAGNOSIS — D509 Iron deficiency anemia, unspecified: Secondary | ICD-10-CM | POA: Diagnosis not present

## 2015-06-06 DIAGNOSIS — Z992 Dependence on renal dialysis: Secondary | ICD-10-CM | POA: Diagnosis not present

## 2015-06-06 DIAGNOSIS — D631 Anemia in chronic kidney disease: Secondary | ICD-10-CM | POA: Diagnosis not present

## 2015-06-06 DIAGNOSIS — D509 Iron deficiency anemia, unspecified: Secondary | ICD-10-CM | POA: Diagnosis not present

## 2015-06-06 DIAGNOSIS — N2581 Secondary hyperparathyroidism of renal origin: Secondary | ICD-10-CM | POA: Diagnosis not present

## 2015-06-06 DIAGNOSIS — N186 End stage renal disease: Secondary | ICD-10-CM | POA: Diagnosis not present

## 2015-06-09 DIAGNOSIS — N186 End stage renal disease: Secondary | ICD-10-CM | POA: Diagnosis not present

## 2015-06-09 DIAGNOSIS — Z992 Dependence on renal dialysis: Secondary | ICD-10-CM | POA: Diagnosis not present

## 2015-06-09 DIAGNOSIS — N2581 Secondary hyperparathyroidism of renal origin: Secondary | ICD-10-CM | POA: Diagnosis not present

## 2015-06-09 DIAGNOSIS — D509 Iron deficiency anemia, unspecified: Secondary | ICD-10-CM | POA: Diagnosis not present

## 2015-06-09 DIAGNOSIS — D631 Anemia in chronic kidney disease: Secondary | ICD-10-CM | POA: Diagnosis not present

## 2015-06-11 DIAGNOSIS — D631 Anemia in chronic kidney disease: Secondary | ICD-10-CM | POA: Diagnosis not present

## 2015-06-11 DIAGNOSIS — D509 Iron deficiency anemia, unspecified: Secondary | ICD-10-CM | POA: Diagnosis not present

## 2015-06-11 DIAGNOSIS — N186 End stage renal disease: Secondary | ICD-10-CM | POA: Diagnosis not present

## 2015-06-11 DIAGNOSIS — Z992 Dependence on renal dialysis: Secondary | ICD-10-CM | POA: Diagnosis not present

## 2015-06-11 DIAGNOSIS — N2581 Secondary hyperparathyroidism of renal origin: Secondary | ICD-10-CM | POA: Diagnosis not present

## 2015-06-13 DIAGNOSIS — D631 Anemia in chronic kidney disease: Secondary | ICD-10-CM | POA: Diagnosis not present

## 2015-06-13 DIAGNOSIS — N2581 Secondary hyperparathyroidism of renal origin: Secondary | ICD-10-CM | POA: Diagnosis not present

## 2015-06-13 DIAGNOSIS — N186 End stage renal disease: Secondary | ICD-10-CM | POA: Diagnosis not present

## 2015-06-13 DIAGNOSIS — D509 Iron deficiency anemia, unspecified: Secondary | ICD-10-CM | POA: Diagnosis not present

## 2015-06-13 DIAGNOSIS — Z992 Dependence on renal dialysis: Secondary | ICD-10-CM | POA: Diagnosis not present

## 2015-06-16 DIAGNOSIS — D631 Anemia in chronic kidney disease: Secondary | ICD-10-CM | POA: Diagnosis not present

## 2015-06-16 DIAGNOSIS — N2581 Secondary hyperparathyroidism of renal origin: Secondary | ICD-10-CM | POA: Diagnosis not present

## 2015-06-16 DIAGNOSIS — Z992 Dependence on renal dialysis: Secondary | ICD-10-CM | POA: Diagnosis not present

## 2015-06-16 DIAGNOSIS — N186 End stage renal disease: Secondary | ICD-10-CM | POA: Diagnosis not present

## 2015-06-16 DIAGNOSIS — D509 Iron deficiency anemia, unspecified: Secondary | ICD-10-CM | POA: Diagnosis not present

## 2015-06-18 DIAGNOSIS — N186 End stage renal disease: Secondary | ICD-10-CM | POA: Diagnosis not present

## 2015-06-18 DIAGNOSIS — Z992 Dependence on renal dialysis: Secondary | ICD-10-CM | POA: Diagnosis not present

## 2015-06-18 DIAGNOSIS — D631 Anemia in chronic kidney disease: Secondary | ICD-10-CM | POA: Diagnosis not present

## 2015-06-18 DIAGNOSIS — D509 Iron deficiency anemia, unspecified: Secondary | ICD-10-CM | POA: Diagnosis not present

## 2015-06-18 DIAGNOSIS — N2581 Secondary hyperparathyroidism of renal origin: Secondary | ICD-10-CM | POA: Diagnosis not present

## 2015-06-20 DIAGNOSIS — Z992 Dependence on renal dialysis: Secondary | ICD-10-CM | POA: Diagnosis not present

## 2015-06-20 DIAGNOSIS — N2581 Secondary hyperparathyroidism of renal origin: Secondary | ICD-10-CM | POA: Diagnosis not present

## 2015-06-20 DIAGNOSIS — D509 Iron deficiency anemia, unspecified: Secondary | ICD-10-CM | POA: Diagnosis not present

## 2015-06-20 DIAGNOSIS — N186 End stage renal disease: Secondary | ICD-10-CM | POA: Diagnosis not present

## 2015-06-20 DIAGNOSIS — D631 Anemia in chronic kidney disease: Secondary | ICD-10-CM | POA: Diagnosis not present

## 2015-06-23 DIAGNOSIS — Z992 Dependence on renal dialysis: Secondary | ICD-10-CM | POA: Diagnosis not present

## 2015-06-23 DIAGNOSIS — D509 Iron deficiency anemia, unspecified: Secondary | ICD-10-CM | POA: Diagnosis not present

## 2015-06-23 DIAGNOSIS — N2581 Secondary hyperparathyroidism of renal origin: Secondary | ICD-10-CM | POA: Diagnosis not present

## 2015-06-23 DIAGNOSIS — D631 Anemia in chronic kidney disease: Secondary | ICD-10-CM | POA: Diagnosis not present

## 2015-06-23 DIAGNOSIS — N186 End stage renal disease: Secondary | ICD-10-CM | POA: Diagnosis not present

## 2015-06-25 DIAGNOSIS — D509 Iron deficiency anemia, unspecified: Secondary | ICD-10-CM | POA: Diagnosis not present

## 2015-06-25 DIAGNOSIS — Z992 Dependence on renal dialysis: Secondary | ICD-10-CM | POA: Diagnosis not present

## 2015-06-25 DIAGNOSIS — N186 End stage renal disease: Secondary | ICD-10-CM | POA: Diagnosis not present

## 2015-06-25 DIAGNOSIS — D631 Anemia in chronic kidney disease: Secondary | ICD-10-CM | POA: Diagnosis not present

## 2015-06-25 DIAGNOSIS — N2581 Secondary hyperparathyroidism of renal origin: Secondary | ICD-10-CM | POA: Diagnosis not present

## 2015-06-27 DIAGNOSIS — D631 Anemia in chronic kidney disease: Secondary | ICD-10-CM | POA: Diagnosis not present

## 2015-06-27 DIAGNOSIS — Z992 Dependence on renal dialysis: Secondary | ICD-10-CM | POA: Diagnosis not present

## 2015-06-27 DIAGNOSIS — N186 End stage renal disease: Secondary | ICD-10-CM | POA: Diagnosis not present

## 2015-06-27 DIAGNOSIS — N2581 Secondary hyperparathyroidism of renal origin: Secondary | ICD-10-CM | POA: Diagnosis not present

## 2015-06-27 DIAGNOSIS — D509 Iron deficiency anemia, unspecified: Secondary | ICD-10-CM | POA: Diagnosis not present

## 2015-06-30 DIAGNOSIS — D509 Iron deficiency anemia, unspecified: Secondary | ICD-10-CM | POA: Diagnosis not present

## 2015-06-30 DIAGNOSIS — Z992 Dependence on renal dialysis: Secondary | ICD-10-CM | POA: Diagnosis not present

## 2015-06-30 DIAGNOSIS — N2581 Secondary hyperparathyroidism of renal origin: Secondary | ICD-10-CM | POA: Diagnosis not present

## 2015-06-30 DIAGNOSIS — D631 Anemia in chronic kidney disease: Secondary | ICD-10-CM | POA: Diagnosis not present

## 2015-06-30 DIAGNOSIS — N186 End stage renal disease: Secondary | ICD-10-CM | POA: Diagnosis not present

## 2015-07-02 DIAGNOSIS — D509 Iron deficiency anemia, unspecified: Secondary | ICD-10-CM | POA: Diagnosis not present

## 2015-07-02 DIAGNOSIS — Z992 Dependence on renal dialysis: Secondary | ICD-10-CM | POA: Diagnosis not present

## 2015-07-02 DIAGNOSIS — D631 Anemia in chronic kidney disease: Secondary | ICD-10-CM | POA: Diagnosis not present

## 2015-07-02 DIAGNOSIS — N2581 Secondary hyperparathyroidism of renal origin: Secondary | ICD-10-CM | POA: Diagnosis not present

## 2015-07-02 DIAGNOSIS — N186 End stage renal disease: Secondary | ICD-10-CM | POA: Diagnosis not present

## 2015-07-04 DIAGNOSIS — D509 Iron deficiency anemia, unspecified: Secondary | ICD-10-CM | POA: Diagnosis not present

## 2015-07-04 DIAGNOSIS — N2581 Secondary hyperparathyroidism of renal origin: Secondary | ICD-10-CM | POA: Diagnosis not present

## 2015-07-04 DIAGNOSIS — D631 Anemia in chronic kidney disease: Secondary | ICD-10-CM | POA: Diagnosis not present

## 2015-07-04 DIAGNOSIS — Z992 Dependence on renal dialysis: Secondary | ICD-10-CM | POA: Diagnosis not present

## 2015-07-04 DIAGNOSIS — N186 End stage renal disease: Secondary | ICD-10-CM | POA: Diagnosis not present

## 2015-07-07 DIAGNOSIS — N2581 Secondary hyperparathyroidism of renal origin: Secondary | ICD-10-CM | POA: Diagnosis not present

## 2015-07-07 DIAGNOSIS — Z4931 Encounter for adequacy testing for hemodialysis: Secondary | ICD-10-CM | POA: Diagnosis not present

## 2015-07-07 DIAGNOSIS — D509 Iron deficiency anemia, unspecified: Secondary | ICD-10-CM | POA: Diagnosis not present

## 2015-07-07 DIAGNOSIS — Z992 Dependence on renal dialysis: Secondary | ICD-10-CM | POA: Diagnosis not present

## 2015-07-07 DIAGNOSIS — N186 End stage renal disease: Secondary | ICD-10-CM | POA: Diagnosis not present

## 2015-07-07 DIAGNOSIS — D631 Anemia in chronic kidney disease: Secondary | ICD-10-CM | POA: Diagnosis not present

## 2015-07-09 DIAGNOSIS — D509 Iron deficiency anemia, unspecified: Secondary | ICD-10-CM | POA: Diagnosis not present

## 2015-07-09 DIAGNOSIS — Z992 Dependence on renal dialysis: Secondary | ICD-10-CM | POA: Diagnosis not present

## 2015-07-09 DIAGNOSIS — N186 End stage renal disease: Secondary | ICD-10-CM | POA: Diagnosis not present

## 2015-07-09 DIAGNOSIS — D631 Anemia in chronic kidney disease: Secondary | ICD-10-CM | POA: Diagnosis not present

## 2015-07-09 DIAGNOSIS — N2581 Secondary hyperparathyroidism of renal origin: Secondary | ICD-10-CM | POA: Diagnosis not present

## 2015-07-11 DIAGNOSIS — D509 Iron deficiency anemia, unspecified: Secondary | ICD-10-CM | POA: Diagnosis not present

## 2015-07-11 DIAGNOSIS — N186 End stage renal disease: Secondary | ICD-10-CM | POA: Diagnosis not present

## 2015-07-11 DIAGNOSIS — D631 Anemia in chronic kidney disease: Secondary | ICD-10-CM | POA: Diagnosis not present

## 2015-07-11 DIAGNOSIS — Z992 Dependence on renal dialysis: Secondary | ICD-10-CM | POA: Diagnosis not present

## 2015-07-11 DIAGNOSIS — N2581 Secondary hyperparathyroidism of renal origin: Secondary | ICD-10-CM | POA: Diagnosis not present

## 2015-07-14 DIAGNOSIS — D509 Iron deficiency anemia, unspecified: Secondary | ICD-10-CM | POA: Diagnosis not present

## 2015-07-14 DIAGNOSIS — N186 End stage renal disease: Secondary | ICD-10-CM | POA: Diagnosis not present

## 2015-07-14 DIAGNOSIS — Z992 Dependence on renal dialysis: Secondary | ICD-10-CM | POA: Diagnosis not present

## 2015-07-14 DIAGNOSIS — D631 Anemia in chronic kidney disease: Secondary | ICD-10-CM | POA: Diagnosis not present

## 2015-07-14 DIAGNOSIS — N2581 Secondary hyperparathyroidism of renal origin: Secondary | ICD-10-CM | POA: Diagnosis not present

## 2015-07-16 DIAGNOSIS — N2581 Secondary hyperparathyroidism of renal origin: Secondary | ICD-10-CM | POA: Diagnosis not present

## 2015-07-16 DIAGNOSIS — D509 Iron deficiency anemia, unspecified: Secondary | ICD-10-CM | POA: Diagnosis not present

## 2015-07-16 DIAGNOSIS — N186 End stage renal disease: Secondary | ICD-10-CM | POA: Diagnosis not present

## 2015-07-16 DIAGNOSIS — D631 Anemia in chronic kidney disease: Secondary | ICD-10-CM | POA: Diagnosis not present

## 2015-07-16 DIAGNOSIS — Z992 Dependence on renal dialysis: Secondary | ICD-10-CM | POA: Diagnosis not present

## 2015-07-18 DIAGNOSIS — N2581 Secondary hyperparathyroidism of renal origin: Secondary | ICD-10-CM | POA: Diagnosis not present

## 2015-07-18 DIAGNOSIS — N186 End stage renal disease: Secondary | ICD-10-CM | POA: Diagnosis not present

## 2015-07-18 DIAGNOSIS — D631 Anemia in chronic kidney disease: Secondary | ICD-10-CM | POA: Diagnosis not present

## 2015-07-18 DIAGNOSIS — D509 Iron deficiency anemia, unspecified: Secondary | ICD-10-CM | POA: Diagnosis not present

## 2015-07-18 DIAGNOSIS — Z992 Dependence on renal dialysis: Secondary | ICD-10-CM | POA: Diagnosis not present

## 2015-07-21 DIAGNOSIS — N2581 Secondary hyperparathyroidism of renal origin: Secondary | ICD-10-CM | POA: Diagnosis not present

## 2015-07-21 DIAGNOSIS — D631 Anemia in chronic kidney disease: Secondary | ICD-10-CM | POA: Diagnosis not present

## 2015-07-21 DIAGNOSIS — D509 Iron deficiency anemia, unspecified: Secondary | ICD-10-CM | POA: Diagnosis not present

## 2015-07-21 DIAGNOSIS — N186 End stage renal disease: Secondary | ICD-10-CM | POA: Diagnosis not present

## 2015-07-21 DIAGNOSIS — Z992 Dependence on renal dialysis: Secondary | ICD-10-CM | POA: Diagnosis not present

## 2015-07-22 DIAGNOSIS — E7800 Pure hypercholesterolemia, unspecified: Secondary | ICD-10-CM | POA: Diagnosis not present

## 2015-07-22 DIAGNOSIS — E875 Hyperkalemia: Secondary | ICD-10-CM | POA: Diagnosis not present

## 2015-07-22 DIAGNOSIS — N189 Chronic kidney disease, unspecified: Secondary | ICD-10-CM | POA: Diagnosis not present

## 2015-07-22 DIAGNOSIS — I10 Essential (primary) hypertension: Secondary | ICD-10-CM | POA: Diagnosis not present

## 2015-07-22 DIAGNOSIS — E1165 Type 2 diabetes mellitus with hyperglycemia: Secondary | ICD-10-CM | POA: Diagnosis not present

## 2015-07-22 DIAGNOSIS — E039 Hypothyroidism, unspecified: Secondary | ICD-10-CM | POA: Diagnosis not present

## 2015-07-23 DIAGNOSIS — N186 End stage renal disease: Secondary | ICD-10-CM | POA: Diagnosis not present

## 2015-07-23 DIAGNOSIS — N2581 Secondary hyperparathyroidism of renal origin: Secondary | ICD-10-CM | POA: Diagnosis not present

## 2015-07-23 DIAGNOSIS — Z992 Dependence on renal dialysis: Secondary | ICD-10-CM | POA: Diagnosis not present

## 2015-07-23 DIAGNOSIS — D509 Iron deficiency anemia, unspecified: Secondary | ICD-10-CM | POA: Diagnosis not present

## 2015-07-23 DIAGNOSIS — D631 Anemia in chronic kidney disease: Secondary | ICD-10-CM | POA: Diagnosis not present

## 2015-07-24 DIAGNOSIS — I10 Essential (primary) hypertension: Secondary | ICD-10-CM | POA: Diagnosis not present

## 2015-07-24 DIAGNOSIS — K219 Gastro-esophageal reflux disease without esophagitis: Secondary | ICD-10-CM | POA: Diagnosis not present

## 2015-07-24 DIAGNOSIS — N19 Unspecified kidney failure: Secondary | ICD-10-CM | POA: Diagnosis not present

## 2015-07-24 DIAGNOSIS — E039 Hypothyroidism, unspecified: Secondary | ICD-10-CM | POA: Diagnosis not present

## 2015-07-24 DIAGNOSIS — E1165 Type 2 diabetes mellitus with hyperglycemia: Secondary | ICD-10-CM | POA: Diagnosis not present

## 2015-07-25 DIAGNOSIS — D631 Anemia in chronic kidney disease: Secondary | ICD-10-CM | POA: Diagnosis not present

## 2015-07-25 DIAGNOSIS — N186 End stage renal disease: Secondary | ICD-10-CM | POA: Diagnosis not present

## 2015-07-25 DIAGNOSIS — D509 Iron deficiency anemia, unspecified: Secondary | ICD-10-CM | POA: Diagnosis not present

## 2015-07-25 DIAGNOSIS — N2581 Secondary hyperparathyroidism of renal origin: Secondary | ICD-10-CM | POA: Diagnosis not present

## 2015-07-25 DIAGNOSIS — Z992 Dependence on renal dialysis: Secondary | ICD-10-CM | POA: Diagnosis not present

## 2015-07-28 DIAGNOSIS — N186 End stage renal disease: Secondary | ICD-10-CM | POA: Diagnosis not present

## 2015-07-28 DIAGNOSIS — D631 Anemia in chronic kidney disease: Secondary | ICD-10-CM | POA: Diagnosis not present

## 2015-07-28 DIAGNOSIS — Z992 Dependence on renal dialysis: Secondary | ICD-10-CM | POA: Diagnosis not present

## 2015-07-28 DIAGNOSIS — D509 Iron deficiency anemia, unspecified: Secondary | ICD-10-CM | POA: Diagnosis not present

## 2015-07-28 DIAGNOSIS — N2581 Secondary hyperparathyroidism of renal origin: Secondary | ICD-10-CM | POA: Diagnosis not present

## 2015-07-30 DIAGNOSIS — D509 Iron deficiency anemia, unspecified: Secondary | ICD-10-CM | POA: Diagnosis not present

## 2015-07-30 DIAGNOSIS — D631 Anemia in chronic kidney disease: Secondary | ICD-10-CM | POA: Diagnosis not present

## 2015-07-30 DIAGNOSIS — N2581 Secondary hyperparathyroidism of renal origin: Secondary | ICD-10-CM | POA: Diagnosis not present

## 2015-07-30 DIAGNOSIS — Z992 Dependence on renal dialysis: Secondary | ICD-10-CM | POA: Diagnosis not present

## 2015-07-30 DIAGNOSIS — N186 End stage renal disease: Secondary | ICD-10-CM | POA: Diagnosis not present

## 2015-08-01 DIAGNOSIS — N186 End stage renal disease: Secondary | ICD-10-CM | POA: Diagnosis not present

## 2015-08-01 DIAGNOSIS — D509 Iron deficiency anemia, unspecified: Secondary | ICD-10-CM | POA: Diagnosis not present

## 2015-08-01 DIAGNOSIS — N2581 Secondary hyperparathyroidism of renal origin: Secondary | ICD-10-CM | POA: Diagnosis not present

## 2015-08-01 DIAGNOSIS — Z992 Dependence on renal dialysis: Secondary | ICD-10-CM | POA: Diagnosis not present

## 2015-08-01 DIAGNOSIS — D631 Anemia in chronic kidney disease: Secondary | ICD-10-CM | POA: Diagnosis not present

## 2015-08-04 DIAGNOSIS — N2581 Secondary hyperparathyroidism of renal origin: Secondary | ICD-10-CM | POA: Diagnosis not present

## 2015-08-04 DIAGNOSIS — Z992 Dependence on renal dialysis: Secondary | ICD-10-CM | POA: Diagnosis not present

## 2015-08-04 DIAGNOSIS — D509 Iron deficiency anemia, unspecified: Secondary | ICD-10-CM | POA: Diagnosis not present

## 2015-08-04 DIAGNOSIS — D631 Anemia in chronic kidney disease: Secondary | ICD-10-CM | POA: Diagnosis not present

## 2015-08-04 DIAGNOSIS — N186 End stage renal disease: Secondary | ICD-10-CM | POA: Diagnosis not present

## 2015-08-06 DIAGNOSIS — N2581 Secondary hyperparathyroidism of renal origin: Secondary | ICD-10-CM | POA: Diagnosis not present

## 2015-08-06 DIAGNOSIS — Z992 Dependence on renal dialysis: Secondary | ICD-10-CM | POA: Diagnosis not present

## 2015-08-06 DIAGNOSIS — D509 Iron deficiency anemia, unspecified: Secondary | ICD-10-CM | POA: Diagnosis not present

## 2015-08-06 DIAGNOSIS — N186 End stage renal disease: Secondary | ICD-10-CM | POA: Diagnosis not present

## 2015-08-06 DIAGNOSIS — D631 Anemia in chronic kidney disease: Secondary | ICD-10-CM | POA: Diagnosis not present

## 2015-08-08 ENCOUNTER — Ambulatory Visit (INDEPENDENT_AMBULATORY_CARE_PROVIDER_SITE_OTHER): Payer: Medicare Other | Admitting: Urology

## 2015-08-08 DIAGNOSIS — N186 End stage renal disease: Secondary | ICD-10-CM | POA: Diagnosis not present

## 2015-08-08 DIAGNOSIS — D631 Anemia in chronic kidney disease: Secondary | ICD-10-CM | POA: Diagnosis not present

## 2015-08-08 DIAGNOSIS — R972 Elevated prostate specific antigen [PSA]: Secondary | ICD-10-CM

## 2015-08-08 DIAGNOSIS — N400 Enlarged prostate without lower urinary tract symptoms: Secondary | ICD-10-CM | POA: Diagnosis not present

## 2015-08-08 DIAGNOSIS — D509 Iron deficiency anemia, unspecified: Secondary | ICD-10-CM | POA: Diagnosis not present

## 2015-08-08 DIAGNOSIS — Z992 Dependence on renal dialysis: Secondary | ICD-10-CM | POA: Diagnosis not present

## 2015-08-08 DIAGNOSIS — N2581 Secondary hyperparathyroidism of renal origin: Secondary | ICD-10-CM | POA: Diagnosis not present

## 2015-08-11 DIAGNOSIS — N2581 Secondary hyperparathyroidism of renal origin: Secondary | ICD-10-CM | POA: Diagnosis not present

## 2015-08-11 DIAGNOSIS — Z992 Dependence on renal dialysis: Secondary | ICD-10-CM | POA: Diagnosis not present

## 2015-08-11 DIAGNOSIS — D631 Anemia in chronic kidney disease: Secondary | ICD-10-CM | POA: Diagnosis not present

## 2015-08-11 DIAGNOSIS — D509 Iron deficiency anemia, unspecified: Secondary | ICD-10-CM | POA: Diagnosis not present

## 2015-08-11 DIAGNOSIS — N186 End stage renal disease: Secondary | ICD-10-CM | POA: Diagnosis not present

## 2015-08-13 DIAGNOSIS — N2581 Secondary hyperparathyroidism of renal origin: Secondary | ICD-10-CM | POA: Diagnosis not present

## 2015-08-13 DIAGNOSIS — Z992 Dependence on renal dialysis: Secondary | ICD-10-CM | POA: Diagnosis not present

## 2015-08-13 DIAGNOSIS — D509 Iron deficiency anemia, unspecified: Secondary | ICD-10-CM | POA: Diagnosis not present

## 2015-08-13 DIAGNOSIS — N186 End stage renal disease: Secondary | ICD-10-CM | POA: Diagnosis not present

## 2015-08-13 DIAGNOSIS — D631 Anemia in chronic kidney disease: Secondary | ICD-10-CM | POA: Diagnosis not present

## 2015-08-15 DIAGNOSIS — Z992 Dependence on renal dialysis: Secondary | ICD-10-CM | POA: Diagnosis not present

## 2015-08-15 DIAGNOSIS — D509 Iron deficiency anemia, unspecified: Secondary | ICD-10-CM | POA: Diagnosis not present

## 2015-08-15 DIAGNOSIS — N186 End stage renal disease: Secondary | ICD-10-CM | POA: Diagnosis not present

## 2015-08-15 DIAGNOSIS — N2581 Secondary hyperparathyroidism of renal origin: Secondary | ICD-10-CM | POA: Diagnosis not present

## 2015-08-15 DIAGNOSIS — D631 Anemia in chronic kidney disease: Secondary | ICD-10-CM | POA: Diagnosis not present

## 2015-08-18 DIAGNOSIS — Z992 Dependence on renal dialysis: Secondary | ICD-10-CM | POA: Diagnosis not present

## 2015-08-18 DIAGNOSIS — D509 Iron deficiency anemia, unspecified: Secondary | ICD-10-CM | POA: Diagnosis not present

## 2015-08-18 DIAGNOSIS — N186 End stage renal disease: Secondary | ICD-10-CM | POA: Diagnosis not present

## 2015-08-18 DIAGNOSIS — D631 Anemia in chronic kidney disease: Secondary | ICD-10-CM | POA: Diagnosis not present

## 2015-08-18 DIAGNOSIS — N2581 Secondary hyperparathyroidism of renal origin: Secondary | ICD-10-CM | POA: Diagnosis not present

## 2015-08-20 DIAGNOSIS — Z992 Dependence on renal dialysis: Secondary | ICD-10-CM | POA: Diagnosis not present

## 2015-08-20 DIAGNOSIS — N2581 Secondary hyperparathyroidism of renal origin: Secondary | ICD-10-CM | POA: Diagnosis not present

## 2015-08-20 DIAGNOSIS — D509 Iron deficiency anemia, unspecified: Secondary | ICD-10-CM | POA: Diagnosis not present

## 2015-08-20 DIAGNOSIS — D631 Anemia in chronic kidney disease: Secondary | ICD-10-CM | POA: Diagnosis not present

## 2015-08-20 DIAGNOSIS — N186 End stage renal disease: Secondary | ICD-10-CM | POA: Diagnosis not present

## 2015-08-22 DIAGNOSIS — Z992 Dependence on renal dialysis: Secondary | ICD-10-CM | POA: Diagnosis not present

## 2015-08-22 DIAGNOSIS — D509 Iron deficiency anemia, unspecified: Secondary | ICD-10-CM | POA: Diagnosis not present

## 2015-08-22 DIAGNOSIS — D631 Anemia in chronic kidney disease: Secondary | ICD-10-CM | POA: Diagnosis not present

## 2015-08-22 DIAGNOSIS — N186 End stage renal disease: Secondary | ICD-10-CM | POA: Diagnosis not present

## 2015-08-22 DIAGNOSIS — N2581 Secondary hyperparathyroidism of renal origin: Secondary | ICD-10-CM | POA: Diagnosis not present

## 2015-08-25 DIAGNOSIS — N2581 Secondary hyperparathyroidism of renal origin: Secondary | ICD-10-CM | POA: Diagnosis not present

## 2015-08-25 DIAGNOSIS — D631 Anemia in chronic kidney disease: Secondary | ICD-10-CM | POA: Diagnosis not present

## 2015-08-25 DIAGNOSIS — Z992 Dependence on renal dialysis: Secondary | ICD-10-CM | POA: Diagnosis not present

## 2015-08-25 DIAGNOSIS — N186 End stage renal disease: Secondary | ICD-10-CM | POA: Diagnosis not present

## 2015-08-25 DIAGNOSIS — D509 Iron deficiency anemia, unspecified: Secondary | ICD-10-CM | POA: Diagnosis not present

## 2015-08-27 DIAGNOSIS — N186 End stage renal disease: Secondary | ICD-10-CM | POA: Diagnosis not present

## 2015-08-27 DIAGNOSIS — Z992 Dependence on renal dialysis: Secondary | ICD-10-CM | POA: Diagnosis not present

## 2015-08-27 DIAGNOSIS — D509 Iron deficiency anemia, unspecified: Secondary | ICD-10-CM | POA: Diagnosis not present

## 2015-08-27 DIAGNOSIS — N2581 Secondary hyperparathyroidism of renal origin: Secondary | ICD-10-CM | POA: Diagnosis not present

## 2015-08-27 DIAGNOSIS — D631 Anemia in chronic kidney disease: Secondary | ICD-10-CM | POA: Diagnosis not present

## 2015-08-29 DIAGNOSIS — N2581 Secondary hyperparathyroidism of renal origin: Secondary | ICD-10-CM | POA: Diagnosis not present

## 2015-08-29 DIAGNOSIS — D631 Anemia in chronic kidney disease: Secondary | ICD-10-CM | POA: Diagnosis not present

## 2015-08-29 DIAGNOSIS — Z992 Dependence on renal dialysis: Secondary | ICD-10-CM | POA: Diagnosis not present

## 2015-08-29 DIAGNOSIS — D509 Iron deficiency anemia, unspecified: Secondary | ICD-10-CM | POA: Diagnosis not present

## 2015-08-29 DIAGNOSIS — N186 End stage renal disease: Secondary | ICD-10-CM | POA: Diagnosis not present

## 2015-09-01 DIAGNOSIS — Z992 Dependence on renal dialysis: Secondary | ICD-10-CM | POA: Diagnosis not present

## 2015-09-01 DIAGNOSIS — N2581 Secondary hyperparathyroidism of renal origin: Secondary | ICD-10-CM | POA: Diagnosis not present

## 2015-09-01 DIAGNOSIS — D509 Iron deficiency anemia, unspecified: Secondary | ICD-10-CM | POA: Diagnosis not present

## 2015-09-01 DIAGNOSIS — D631 Anemia in chronic kidney disease: Secondary | ICD-10-CM | POA: Diagnosis not present

## 2015-09-01 DIAGNOSIS — N186 End stage renal disease: Secondary | ICD-10-CM | POA: Diagnosis not present

## 2015-09-03 DIAGNOSIS — D509 Iron deficiency anemia, unspecified: Secondary | ICD-10-CM | POA: Diagnosis not present

## 2015-09-03 DIAGNOSIS — Z992 Dependence on renal dialysis: Secondary | ICD-10-CM | POA: Diagnosis not present

## 2015-09-03 DIAGNOSIS — N186 End stage renal disease: Secondary | ICD-10-CM | POA: Diagnosis not present

## 2015-09-03 DIAGNOSIS — N2581 Secondary hyperparathyroidism of renal origin: Secondary | ICD-10-CM | POA: Diagnosis not present

## 2015-09-05 DIAGNOSIS — D509 Iron deficiency anemia, unspecified: Secondary | ICD-10-CM | POA: Diagnosis not present

## 2015-09-05 DIAGNOSIS — Z992 Dependence on renal dialysis: Secondary | ICD-10-CM | POA: Diagnosis not present

## 2015-09-05 DIAGNOSIS — N2581 Secondary hyperparathyroidism of renal origin: Secondary | ICD-10-CM | POA: Diagnosis not present

## 2015-09-05 DIAGNOSIS — N186 End stage renal disease: Secondary | ICD-10-CM | POA: Diagnosis not present

## 2015-09-08 DIAGNOSIS — Z794 Long term (current) use of insulin: Secondary | ICD-10-CM | POA: Diagnosis not present

## 2015-09-08 DIAGNOSIS — Z87442 Personal history of urinary calculi: Secondary | ICD-10-CM | POA: Diagnosis not present

## 2015-09-08 DIAGNOSIS — I159 Secondary hypertension, unspecified: Secondary | ICD-10-CM | POA: Diagnosis not present

## 2015-09-08 DIAGNOSIS — Z940 Kidney transplant status: Secondary | ICD-10-CM | POA: Diagnosis not present

## 2015-09-08 DIAGNOSIS — E1165 Type 2 diabetes mellitus with hyperglycemia: Secondary | ICD-10-CM | POA: Diagnosis not present

## 2015-09-08 DIAGNOSIS — R944 Abnormal results of kidney function studies: Secondary | ICD-10-CM | POA: Diagnosis not present

## 2015-09-08 DIAGNOSIS — E039 Hypothyroidism, unspecified: Secondary | ICD-10-CM | POA: Diagnosis present

## 2015-09-08 DIAGNOSIS — E8809 Other disorders of plasma-protein metabolism, not elsewhere classified: Secondary | ICD-10-CM | POA: Diagnosis present

## 2015-09-08 DIAGNOSIS — Z79899 Other long term (current) drug therapy: Secondary | ICD-10-CM | POA: Diagnosis not present

## 2015-09-08 DIAGNOSIS — R918 Other nonspecific abnormal finding of lung field: Secondary | ICD-10-CM | POA: Diagnosis not present

## 2015-09-08 DIAGNOSIS — Z452 Encounter for adjustment and management of vascular access device: Secondary | ICD-10-CM | POA: Diagnosis not present

## 2015-09-08 DIAGNOSIS — E119 Type 2 diabetes mellitus without complications: Secondary | ICD-10-CM | POA: Diagnosis not present

## 2015-09-08 DIAGNOSIS — I2510 Atherosclerotic heart disease of native coronary artery without angina pectoris: Secondary | ICD-10-CM | POA: Diagnosis present

## 2015-09-08 DIAGNOSIS — Z7982 Long term (current) use of aspirin: Secondary | ICD-10-CM | POA: Diagnosis not present

## 2015-09-08 DIAGNOSIS — Z905 Acquired absence of kidney: Secondary | ICD-10-CM | POA: Diagnosis not present

## 2015-09-08 DIAGNOSIS — N2581 Secondary hyperparathyroidism of renal origin: Secondary | ICD-10-CM | POA: Diagnosis not present

## 2015-09-08 DIAGNOSIS — N400 Enlarged prostate without lower urinary tract symptoms: Secondary | ICD-10-CM | POA: Diagnosis present

## 2015-09-08 DIAGNOSIS — Z5181 Encounter for therapeutic drug level monitoring: Secondary | ICD-10-CM | POA: Diagnosis not present

## 2015-09-08 DIAGNOSIS — Z01818 Encounter for other preprocedural examination: Secondary | ICD-10-CM | POA: Diagnosis not present

## 2015-09-08 DIAGNOSIS — D62 Acute posthemorrhagic anemia: Secondary | ICD-10-CM | POA: Diagnosis not present

## 2015-09-08 DIAGNOSIS — E8770 Fluid overload, unspecified: Secondary | ICD-10-CM | POA: Diagnosis not present

## 2015-09-08 DIAGNOSIS — R339 Retention of urine, unspecified: Secondary | ICD-10-CM | POA: Diagnosis not present

## 2015-09-08 DIAGNOSIS — N186 End stage renal disease: Secondary | ICD-10-CM | POA: Diagnosis present

## 2015-09-08 DIAGNOSIS — D631 Anemia in chronic kidney disease: Secondary | ICD-10-CM | POA: Diagnosis present

## 2015-09-08 DIAGNOSIS — I10 Essential (primary) hypertension: Secondary | ICD-10-CM | POA: Diagnosis not present

## 2015-09-08 DIAGNOSIS — Z4822 Encounter for aftercare following kidney transplant: Secondary | ICD-10-CM | POA: Diagnosis not present

## 2015-09-08 DIAGNOSIS — D72829 Elevated white blood cell count, unspecified: Secondary | ICD-10-CM | POA: Diagnosis not present

## 2015-09-08 DIAGNOSIS — E1122 Type 2 diabetes mellitus with diabetic chronic kidney disease: Secondary | ICD-10-CM | POA: Diagnosis present

## 2015-09-08 DIAGNOSIS — I120 Hypertensive chronic kidney disease with stage 5 chronic kidney disease or end stage renal disease: Secondary | ICD-10-CM | POA: Diagnosis present

## 2015-09-08 DIAGNOSIS — I151 Hypertension secondary to other renal disorders: Secondary | ICD-10-CM | POA: Diagnosis not present

## 2015-09-08 DIAGNOSIS — E871 Hypo-osmolality and hyponatremia: Secondary | ICD-10-CM | POA: Diagnosis not present

## 2015-09-08 DIAGNOSIS — Z992 Dependence on renal dialysis: Secondary | ICD-10-CM | POA: Diagnosis not present

## 2015-09-08 DIAGNOSIS — I4510 Unspecified right bundle-branch block: Secondary | ICD-10-CM | POA: Diagnosis not present

## 2015-09-08 DIAGNOSIS — E1121 Type 2 diabetes mellitus with diabetic nephropathy: Secondary | ICD-10-CM | POA: Diagnosis present

## 2015-09-08 DIAGNOSIS — D509 Iron deficiency anemia, unspecified: Secondary | ICD-10-CM | POA: Diagnosis not present

## 2015-09-08 DIAGNOSIS — Z955 Presence of coronary angioplasty implant and graft: Secondary | ICD-10-CM | POA: Diagnosis not present

## 2015-09-15 DIAGNOSIS — D8989 Other specified disorders involving the immune mechanism, not elsewhere classified: Secondary | ICD-10-CM | POA: Diagnosis not present

## 2015-09-15 DIAGNOSIS — M7989 Other specified soft tissue disorders: Secondary | ICD-10-CM | POA: Diagnosis not present

## 2015-09-15 DIAGNOSIS — E119 Type 2 diabetes mellitus without complications: Secondary | ICD-10-CM | POA: Diagnosis not present

## 2015-09-15 DIAGNOSIS — I25119 Atherosclerotic heart disease of native coronary artery with unspecified angina pectoris: Secondary | ICD-10-CM | POA: Diagnosis not present

## 2015-09-15 DIAGNOSIS — Z5181 Encounter for therapeutic drug level monitoring: Secondary | ICD-10-CM | POA: Diagnosis not present

## 2015-09-15 DIAGNOSIS — Z794 Long term (current) use of insulin: Secondary | ICD-10-CM | POA: Diagnosis not present

## 2015-09-15 DIAGNOSIS — Z940 Kidney transplant status: Secondary | ICD-10-CM | POA: Diagnosis not present

## 2015-09-15 DIAGNOSIS — R531 Weakness: Secondary | ICD-10-CM | POA: Diagnosis not present

## 2015-09-15 DIAGNOSIS — E860 Dehydration: Secondary | ICD-10-CM | POA: Diagnosis not present

## 2015-09-15 DIAGNOSIS — I10 Essential (primary) hypertension: Secondary | ICD-10-CM | POA: Diagnosis not present

## 2015-09-15 DIAGNOSIS — E785 Hyperlipidemia, unspecified: Secondary | ICD-10-CM | POA: Diagnosis not present

## 2015-09-15 DIAGNOSIS — Z79899 Other long term (current) drug therapy: Secondary | ICD-10-CM | POA: Diagnosis not present

## 2015-09-15 DIAGNOSIS — I150 Renovascular hypertension: Secondary | ICD-10-CM | POA: Diagnosis not present

## 2015-09-15 DIAGNOSIS — N400 Enlarged prostate without lower urinary tract symptoms: Secondary | ICD-10-CM | POA: Diagnosis not present

## 2015-09-15 DIAGNOSIS — I120 Hypertensive chronic kidney disease with stage 5 chronic kidney disease or end stage renal disease: Secondary | ICD-10-CM | POA: Diagnosis not present

## 2015-09-15 DIAGNOSIS — Z4822 Encounter for aftercare following kidney transplant: Secondary | ICD-10-CM | POA: Diagnosis not present

## 2015-09-16 DIAGNOSIS — R634 Abnormal weight loss: Secondary | ICD-10-CM | POA: Diagnosis not present

## 2015-09-16 DIAGNOSIS — Z940 Kidney transplant status: Secondary | ICD-10-CM | POA: Diagnosis not present

## 2015-09-16 DIAGNOSIS — R29898 Other symptoms and signs involving the musculoskeletal system: Secondary | ICD-10-CM | POA: Diagnosis not present

## 2015-09-16 DIAGNOSIS — R1084 Generalized abdominal pain: Secondary | ICD-10-CM | POA: Diagnosis not present

## 2015-09-16 DIAGNOSIS — K219 Gastro-esophageal reflux disease without esophagitis: Secondary | ICD-10-CM | POA: Diagnosis not present

## 2015-09-17 DIAGNOSIS — N179 Acute kidney failure, unspecified: Secondary | ICD-10-CM | POA: Diagnosis not present

## 2015-09-17 DIAGNOSIS — R6521 Severe sepsis with septic shock: Secondary | ICD-10-CM | POA: Diagnosis not present

## 2015-09-17 DIAGNOSIS — E1165 Type 2 diabetes mellitus with hyperglycemia: Secondary | ICD-10-CM | POA: Diagnosis present

## 2015-09-17 DIAGNOSIS — Z992 Dependence on renal dialysis: Secondary | ICD-10-CM | POA: Diagnosis not present

## 2015-09-17 DIAGNOSIS — E119 Type 2 diabetes mellitus without complications: Secondary | ICD-10-CM | POA: Diagnosis not present

## 2015-09-17 DIAGNOSIS — N5089 Other specified disorders of the male genital organs: Secondary | ICD-10-CM | POA: Diagnosis present

## 2015-09-17 DIAGNOSIS — Z794 Long term (current) use of insulin: Secondary | ICD-10-CM | POA: Diagnosis not present

## 2015-09-17 DIAGNOSIS — K219 Gastro-esophageal reflux disease without esophagitis: Secondary | ICD-10-CM | POA: Diagnosis present

## 2015-09-17 DIAGNOSIS — I129 Hypertensive chronic kidney disease with stage 1 through stage 4 chronic kidney disease, or unspecified chronic kidney disease: Secondary | ICD-10-CM | POA: Diagnosis not present

## 2015-09-17 DIAGNOSIS — R197 Diarrhea, unspecified: Secondary | ICD-10-CM | POA: Diagnosis not present

## 2015-09-17 DIAGNOSIS — E1169 Type 2 diabetes mellitus with other specified complication: Secondary | ICD-10-CM | POA: Diagnosis not present

## 2015-09-17 DIAGNOSIS — I25119 Atherosclerotic heart disease of native coronary artery with unspecified angina pectoris: Secondary | ICD-10-CM | POA: Diagnosis not present

## 2015-09-17 DIAGNOSIS — I2510 Atherosclerotic heart disease of native coronary artery without angina pectoris: Secondary | ICD-10-CM | POA: Diagnosis present

## 2015-09-17 DIAGNOSIS — N2581 Secondary hyperparathyroidism of renal origin: Secondary | ICD-10-CM | POA: Diagnosis present

## 2015-09-17 DIAGNOSIS — K5900 Constipation, unspecified: Secondary | ICD-10-CM | POA: Diagnosis present

## 2015-09-17 DIAGNOSIS — T859XXA Unspecified complication of internal prosthetic device, implant and graft, initial encounter: Secondary | ICD-10-CM | POA: Diagnosis not present

## 2015-09-17 DIAGNOSIS — T8619 Other complication of kidney transplant: Secondary | ICD-10-CM | POA: Diagnosis not present

## 2015-09-17 DIAGNOSIS — E1122 Type 2 diabetes mellitus with diabetic chronic kidney disease: Secondary | ICD-10-CM | POA: Diagnosis present

## 2015-09-17 DIAGNOSIS — D649 Anemia, unspecified: Secondary | ICD-10-CM | POA: Diagnosis present

## 2015-09-17 DIAGNOSIS — E785 Hyperlipidemia, unspecified: Secondary | ICD-10-CM | POA: Diagnosis not present

## 2015-09-17 DIAGNOSIS — Z4822 Encounter for aftercare following kidney transplant: Secondary | ICD-10-CM | POA: Diagnosis not present

## 2015-09-17 DIAGNOSIS — I150 Renovascular hypertension: Secondary | ICD-10-CM | POA: Diagnosis not present

## 2015-09-17 DIAGNOSIS — E860 Dehydration: Secondary | ICD-10-CM | POA: Diagnosis present

## 2015-09-17 DIAGNOSIS — E039 Hypothyroidism, unspecified: Secondary | ICD-10-CM | POA: Diagnosis present

## 2015-09-17 DIAGNOSIS — Z7982 Long term (current) use of aspirin: Secondary | ICD-10-CM | POA: Diagnosis not present

## 2015-09-17 DIAGNOSIS — Z940 Kidney transplant status: Secondary | ICD-10-CM | POA: Diagnosis not present

## 2015-09-17 DIAGNOSIS — T85618A Breakdown (mechanical) of other specified internal prosthetic devices, implants and grafts, initial encounter: Secondary | ICD-10-CM | POA: Diagnosis not present

## 2015-09-17 DIAGNOSIS — I10 Essential (primary) hypertension: Secondary | ICD-10-CM | POA: Diagnosis not present

## 2015-09-17 DIAGNOSIS — E8809 Other disorders of plasma-protein metabolism, not elsewhere classified: Secondary | ICD-10-CM | POA: Diagnosis present

## 2015-09-17 DIAGNOSIS — A419 Sepsis, unspecified organism: Secondary | ICD-10-CM | POA: Diagnosis not present

## 2015-09-17 DIAGNOSIS — Z79899 Other long term (current) drug therapy: Secondary | ICD-10-CM | POA: Diagnosis not present

## 2015-09-17 DIAGNOSIS — N186 End stage renal disease: Secondary | ICD-10-CM | POA: Diagnosis not present

## 2015-09-17 DIAGNOSIS — R509 Fever, unspecified: Secondary | ICD-10-CM | POA: Diagnosis not present

## 2015-09-17 DIAGNOSIS — N12 Tubulo-interstitial nephritis, not specified as acute or chronic: Secondary | ICD-10-CM | POA: Diagnosis not present

## 2015-09-17 DIAGNOSIS — I959 Hypotension, unspecified: Secondary | ICD-10-CM | POA: Diagnosis not present

## 2015-09-17 DIAGNOSIS — Z5181 Encounter for therapeutic drug level monitoring: Secondary | ICD-10-CM | POA: Diagnosis not present

## 2015-09-17 DIAGNOSIS — R7881 Bacteremia: Secondary | ICD-10-CM | POA: Diagnosis not present

## 2015-09-17 DIAGNOSIS — E8770 Fluid overload, unspecified: Secondary | ICD-10-CM | POA: Diagnosis not present

## 2015-09-17 DIAGNOSIS — N390 Urinary tract infection, site not specified: Secondary | ICD-10-CM | POA: Diagnosis not present

## 2015-09-17 DIAGNOSIS — M7989 Other specified soft tissue disorders: Secondary | ICD-10-CM | POA: Diagnosis not present

## 2015-09-17 DIAGNOSIS — B9689 Other specified bacterial agents as the cause of diseases classified elsewhere: Secondary | ICD-10-CM | POA: Diagnosis not present

## 2015-09-17 DIAGNOSIS — I120 Hypertensive chronic kidney disease with stage 5 chronic kidney disease or end stage renal disease: Secondary | ICD-10-CM | POA: Diagnosis not present

## 2015-09-17 DIAGNOSIS — T8613 Kidney transplant infection: Secondary | ICD-10-CM | POA: Diagnosis not present

## 2015-09-17 DIAGNOSIS — N400 Enlarged prostate without lower urinary tract symptoms: Secondary | ICD-10-CM | POA: Diagnosis present

## 2015-09-29 DIAGNOSIS — Z885 Allergy status to narcotic agent status: Secondary | ICD-10-CM | POA: Diagnosis not present

## 2015-09-29 DIAGNOSIS — Z940 Kidney transplant status: Secondary | ICD-10-CM | POA: Diagnosis not present

## 2015-09-29 DIAGNOSIS — E039 Hypothyroidism, unspecified: Secondary | ICD-10-CM | POA: Diagnosis not present

## 2015-09-29 DIAGNOSIS — N401 Enlarged prostate with lower urinary tract symptoms: Secondary | ICD-10-CM | POA: Diagnosis not present

## 2015-09-29 DIAGNOSIS — E1122 Type 2 diabetes mellitus with diabetic chronic kidney disease: Secondary | ICD-10-CM | POA: Diagnosis not present

## 2015-09-29 DIAGNOSIS — I120 Hypertensive chronic kidney disease with stage 5 chronic kidney disease or end stage renal disease: Secondary | ICD-10-CM | POA: Diagnosis not present

## 2015-09-29 DIAGNOSIS — D899 Disorder involving the immune mechanism, unspecified: Secondary | ICD-10-CM | POA: Diagnosis not present

## 2015-09-29 DIAGNOSIS — E785 Hyperlipidemia, unspecified: Secondary | ICD-10-CM | POA: Diagnosis not present

## 2015-09-29 DIAGNOSIS — I10 Essential (primary) hypertension: Secondary | ICD-10-CM | POA: Diagnosis not present

## 2015-09-29 DIAGNOSIS — I2510 Atherosclerotic heart disease of native coronary artery without angina pectoris: Secondary | ICD-10-CM | POA: Diagnosis not present

## 2015-09-29 DIAGNOSIS — Z882 Allergy status to sulfonamides status: Secondary | ICD-10-CM | POA: Diagnosis not present

## 2015-09-29 DIAGNOSIS — N186 End stage renal disease: Secondary | ICD-10-CM | POA: Diagnosis not present

## 2015-09-29 DIAGNOSIS — K5900 Constipation, unspecified: Secondary | ICD-10-CM | POA: Diagnosis not present

## 2015-09-29 DIAGNOSIS — R5381 Other malaise: Secondary | ICD-10-CM | POA: Diagnosis not present

## 2015-09-29 DIAGNOSIS — Z4822 Encounter for aftercare following kidney transplant: Secondary | ICD-10-CM | POA: Diagnosis not present

## 2015-09-29 DIAGNOSIS — Z7982 Long term (current) use of aspirin: Secondary | ICD-10-CM | POA: Diagnosis not present

## 2015-09-29 DIAGNOSIS — Z794 Long term (current) use of insulin: Secondary | ICD-10-CM | POA: Diagnosis not present

## 2015-09-29 DIAGNOSIS — Z79899 Other long term (current) drug therapy: Secondary | ICD-10-CM | POA: Diagnosis not present

## 2015-09-29 DIAGNOSIS — E119 Type 2 diabetes mellitus without complications: Secondary | ICD-10-CM | POA: Diagnosis not present

## 2015-09-29 DIAGNOSIS — D631 Anemia in chronic kidney disease: Secondary | ICD-10-CM | POA: Diagnosis not present

## 2015-09-30 DIAGNOSIS — Z4822 Encounter for aftercare following kidney transplant: Secondary | ICD-10-CM | POA: Diagnosis not present

## 2015-09-30 DIAGNOSIS — I10 Essential (primary) hypertension: Secondary | ICD-10-CM | POA: Diagnosis not present

## 2015-09-30 DIAGNOSIS — R269 Unspecified abnormalities of gait and mobility: Secondary | ICD-10-CM | POA: Diagnosis not present

## 2015-09-30 DIAGNOSIS — E119 Type 2 diabetes mellitus without complications: Secondary | ICD-10-CM | POA: Diagnosis not present

## 2015-09-30 DIAGNOSIS — I2510 Atherosclerotic heart disease of native coronary artery without angina pectoris: Secondary | ICD-10-CM | POA: Diagnosis not present

## 2015-09-30 DIAGNOSIS — R29898 Other symptoms and signs involving the musculoskeletal system: Secondary | ICD-10-CM | POA: Diagnosis not present

## 2015-10-02 DIAGNOSIS — Z79899 Other long term (current) drug therapy: Secondary | ICD-10-CM | POA: Diagnosis not present

## 2015-10-02 DIAGNOSIS — D631 Anemia in chronic kidney disease: Secondary | ICD-10-CM | POA: Diagnosis not present

## 2015-10-02 DIAGNOSIS — Z940 Kidney transplant status: Secondary | ICD-10-CM | POA: Diagnosis not present

## 2015-10-02 DIAGNOSIS — I120 Hypertensive chronic kidney disease with stage 5 chronic kidney disease or end stage renal disease: Secondary | ICD-10-CM | POA: Diagnosis not present

## 2015-10-02 DIAGNOSIS — N2889 Other specified disorders of kidney and ureter: Secondary | ICD-10-CM | POA: Diagnosis not present

## 2015-10-02 DIAGNOSIS — E8770 Fluid overload, unspecified: Secondary | ICD-10-CM | POA: Diagnosis not present

## 2015-10-02 DIAGNOSIS — E039 Hypothyroidism, unspecified: Secondary | ICD-10-CM | POA: Diagnosis not present

## 2015-10-02 DIAGNOSIS — E119 Type 2 diabetes mellitus without complications: Secondary | ICD-10-CM | POA: Diagnosis not present

## 2015-10-02 DIAGNOSIS — Z992 Dependence on renal dialysis: Secondary | ICD-10-CM | POA: Diagnosis not present

## 2015-10-02 DIAGNOSIS — E785 Hyperlipidemia, unspecified: Secondary | ICD-10-CM | POA: Diagnosis not present

## 2015-10-02 DIAGNOSIS — Z466 Encounter for fitting and adjustment of urinary device: Secondary | ICD-10-CM | POA: Diagnosis not present

## 2015-10-02 DIAGNOSIS — N186 End stage renal disease: Secondary | ICD-10-CM | POA: Diagnosis not present

## 2015-10-02 DIAGNOSIS — Z794 Long term (current) use of insulin: Secondary | ICD-10-CM | POA: Diagnosis not present

## 2015-10-02 DIAGNOSIS — D899 Disorder involving the immune mechanism, unspecified: Secondary | ICD-10-CM | POA: Diagnosis not present

## 2015-10-02 DIAGNOSIS — E1122 Type 2 diabetes mellitus with diabetic chronic kidney disease: Secondary | ICD-10-CM | POA: Diagnosis not present

## 2015-10-02 DIAGNOSIS — I151 Hypertension secondary to other renal disorders: Secondary | ICD-10-CM | POA: Diagnosis not present

## 2015-10-02 DIAGNOSIS — Z4822 Encounter for aftercare following kidney transplant: Secondary | ICD-10-CM | POA: Diagnosis not present

## 2015-10-03 DIAGNOSIS — E1022 Type 1 diabetes mellitus with diabetic chronic kidney disease: Secondary | ICD-10-CM | POA: Diagnosis not present

## 2015-10-03 DIAGNOSIS — Z940 Kidney transplant status: Secondary | ICD-10-CM | POA: Diagnosis not present

## 2015-10-03 DIAGNOSIS — Z9289 Personal history of other medical treatment: Secondary | ICD-10-CM | POA: Diagnosis not present

## 2015-10-03 DIAGNOSIS — Z79899 Other long term (current) drug therapy: Secondary | ICD-10-CM | POA: Diagnosis not present

## 2015-10-03 DIAGNOSIS — I2510 Atherosclerotic heart disease of native coronary artery without angina pectoris: Secondary | ICD-10-CM | POA: Diagnosis not present

## 2015-10-03 DIAGNOSIS — E119 Type 2 diabetes mellitus without complications: Secondary | ICD-10-CM | POA: Diagnosis not present

## 2015-10-03 DIAGNOSIS — N390 Urinary tract infection, site not specified: Secondary | ICD-10-CM | POA: Diagnosis not present

## 2015-10-03 DIAGNOSIS — R269 Unspecified abnormalities of gait and mobility: Secondary | ICD-10-CM | POA: Diagnosis not present

## 2015-10-03 DIAGNOSIS — I10 Essential (primary) hypertension: Secondary | ICD-10-CM | POA: Diagnosis not present

## 2015-10-03 DIAGNOSIS — I151 Hypertension secondary to other renal disorders: Secondary | ICD-10-CM | POA: Diagnosis not present

## 2015-10-03 DIAGNOSIS — D8989 Other specified disorders involving the immune mechanism, not elsewhere classified: Secondary | ICD-10-CM | POA: Diagnosis not present

## 2015-10-03 DIAGNOSIS — E785 Hyperlipidemia, unspecified: Secondary | ICD-10-CM | POA: Diagnosis not present

## 2015-10-03 DIAGNOSIS — Z794 Long term (current) use of insulin: Secondary | ICD-10-CM | POA: Diagnosis not present

## 2015-10-03 DIAGNOSIS — Z4822 Encounter for aftercare following kidney transplant: Secondary | ICD-10-CM | POA: Diagnosis not present

## 2015-10-03 DIAGNOSIS — N401 Enlarged prostate with lower urinary tract symptoms: Secondary | ICD-10-CM | POA: Diagnosis not present

## 2015-10-03 DIAGNOSIS — N186 End stage renal disease: Secondary | ICD-10-CM | POA: Diagnosis not present

## 2015-10-03 DIAGNOSIS — R29898 Other symptoms and signs involving the musculoskeletal system: Secondary | ICD-10-CM | POA: Diagnosis not present

## 2015-10-03 DIAGNOSIS — E039 Hypothyroidism, unspecified: Secondary | ICD-10-CM | POA: Diagnosis not present

## 2015-10-03 DIAGNOSIS — I120 Hypertensive chronic kidney disease with stage 5 chronic kidney disease or end stage renal disease: Secondary | ICD-10-CM | POA: Diagnosis not present

## 2015-10-07 DIAGNOSIS — Z79899 Other long term (current) drug therapy: Secondary | ICD-10-CM | POA: Diagnosis not present

## 2015-10-07 DIAGNOSIS — E119 Type 2 diabetes mellitus without complications: Secondary | ICD-10-CM | POA: Diagnosis not present

## 2015-10-07 DIAGNOSIS — N186 End stage renal disease: Secondary | ICD-10-CM | POA: Diagnosis not present

## 2015-10-07 DIAGNOSIS — I151 Hypertension secondary to other renal disorders: Secondary | ICD-10-CM | POA: Diagnosis not present

## 2015-10-07 DIAGNOSIS — Z940 Kidney transplant status: Secondary | ICD-10-CM | POA: Diagnosis not present

## 2015-10-07 DIAGNOSIS — E1122 Type 2 diabetes mellitus with diabetic chronic kidney disease: Secondary | ICD-10-CM | POA: Diagnosis not present

## 2015-10-07 DIAGNOSIS — E785 Hyperlipidemia, unspecified: Secondary | ICD-10-CM | POA: Diagnosis not present

## 2015-10-07 DIAGNOSIS — I120 Hypertensive chronic kidney disease with stage 5 chronic kidney disease or end stage renal disease: Secondary | ICD-10-CM | POA: Diagnosis not present

## 2015-10-07 DIAGNOSIS — N390 Urinary tract infection, site not specified: Secondary | ICD-10-CM | POA: Diagnosis not present

## 2015-10-07 DIAGNOSIS — A419 Sepsis, unspecified organism: Secondary | ICD-10-CM | POA: Diagnosis not present

## 2015-10-07 DIAGNOSIS — Z794 Long term (current) use of insulin: Secondary | ICD-10-CM | POA: Diagnosis not present

## 2015-10-07 DIAGNOSIS — E039 Hypothyroidism, unspecified: Secondary | ICD-10-CM | POA: Diagnosis not present

## 2015-10-07 DIAGNOSIS — D8989 Other specified disorders involving the immune mechanism, not elsewhere classified: Secondary | ICD-10-CM | POA: Diagnosis not present

## 2015-10-07 DIAGNOSIS — N401 Enlarged prostate with lower urinary tract symptoms: Secondary | ICD-10-CM | POA: Diagnosis not present

## 2015-10-07 DIAGNOSIS — Z4822 Encounter for aftercare following kidney transplant: Secondary | ICD-10-CM | POA: Diagnosis not present

## 2015-10-08 DIAGNOSIS — I10 Essential (primary) hypertension: Secondary | ICD-10-CM | POA: Diagnosis not present

## 2015-10-08 DIAGNOSIS — Z4822 Encounter for aftercare following kidney transplant: Secondary | ICD-10-CM | POA: Diagnosis not present

## 2015-10-08 DIAGNOSIS — R269 Unspecified abnormalities of gait and mobility: Secondary | ICD-10-CM | POA: Diagnosis not present

## 2015-10-08 DIAGNOSIS — E119 Type 2 diabetes mellitus without complications: Secondary | ICD-10-CM | POA: Diagnosis not present

## 2015-10-08 DIAGNOSIS — R29898 Other symptoms and signs involving the musculoskeletal system: Secondary | ICD-10-CM | POA: Diagnosis not present

## 2015-10-08 DIAGNOSIS — I2510 Atherosclerotic heart disease of native coronary artery without angina pectoris: Secondary | ICD-10-CM | POA: Diagnosis not present

## 2015-10-09 DIAGNOSIS — I2510 Atherosclerotic heart disease of native coronary artery without angina pectoris: Secondary | ICD-10-CM | POA: Diagnosis not present

## 2015-10-09 DIAGNOSIS — E119 Type 2 diabetes mellitus without complications: Secondary | ICD-10-CM | POA: Diagnosis not present

## 2015-10-09 DIAGNOSIS — R29898 Other symptoms and signs involving the musculoskeletal system: Secondary | ICD-10-CM | POA: Diagnosis not present

## 2015-10-09 DIAGNOSIS — I10 Essential (primary) hypertension: Secondary | ICD-10-CM | POA: Diagnosis not present

## 2015-10-09 DIAGNOSIS — R269 Unspecified abnormalities of gait and mobility: Secondary | ICD-10-CM | POA: Diagnosis not present

## 2015-10-09 DIAGNOSIS — Z4822 Encounter for aftercare following kidney transplant: Secondary | ICD-10-CM | POA: Diagnosis not present

## 2015-10-10 DIAGNOSIS — I10 Essential (primary) hypertension: Secondary | ICD-10-CM | POA: Diagnosis not present

## 2015-10-10 DIAGNOSIS — E039 Hypothyroidism, unspecified: Secondary | ICD-10-CM | POA: Diagnosis not present

## 2015-10-10 DIAGNOSIS — E119 Type 2 diabetes mellitus without complications: Secondary | ICD-10-CM | POA: Diagnosis not present

## 2015-10-10 DIAGNOSIS — Z992 Dependence on renal dialysis: Secondary | ICD-10-CM | POA: Diagnosis not present

## 2015-10-10 DIAGNOSIS — E1122 Type 2 diabetes mellitus with diabetic chronic kidney disease: Secondary | ICD-10-CM | POA: Diagnosis not present

## 2015-10-10 DIAGNOSIS — R339 Retention of urine, unspecified: Secondary | ICD-10-CM | POA: Diagnosis not present

## 2015-10-10 DIAGNOSIS — N186 End stage renal disease: Secondary | ICD-10-CM | POA: Diagnosis not present

## 2015-10-10 DIAGNOSIS — D8989 Other specified disorders involving the immune mechanism, not elsewhere classified: Secondary | ICD-10-CM | POA: Diagnosis not present

## 2015-10-10 DIAGNOSIS — E785 Hyperlipidemia, unspecified: Secondary | ICD-10-CM | POA: Diagnosis not present

## 2015-10-10 DIAGNOSIS — Z940 Kidney transplant status: Secondary | ICD-10-CM | POA: Diagnosis not present

## 2015-10-10 DIAGNOSIS — B9689 Other specified bacterial agents as the cause of diseases classified elsewhere: Secondary | ICD-10-CM | POA: Diagnosis not present

## 2015-10-10 DIAGNOSIS — Z79899 Other long term (current) drug therapy: Secondary | ICD-10-CM | POA: Diagnosis not present

## 2015-10-10 DIAGNOSIS — I2510 Atherosclerotic heart disease of native coronary artery without angina pectoris: Secondary | ICD-10-CM | POA: Diagnosis not present

## 2015-10-10 DIAGNOSIS — Z794 Long term (current) use of insulin: Secondary | ICD-10-CM | POA: Diagnosis not present

## 2015-10-10 DIAGNOSIS — I120 Hypertensive chronic kidney disease with stage 5 chronic kidney disease or end stage renal disease: Secondary | ICD-10-CM | POA: Diagnosis not present

## 2015-10-10 DIAGNOSIS — N390 Urinary tract infection, site not specified: Secondary | ICD-10-CM | POA: Diagnosis not present

## 2015-10-10 DIAGNOSIS — Z4822 Encounter for aftercare following kidney transplant: Secondary | ICD-10-CM | POA: Diagnosis not present

## 2015-10-13 DIAGNOSIS — R079 Chest pain, unspecified: Secondary | ICD-10-CM | POA: Diagnosis not present

## 2015-10-13 DIAGNOSIS — E119 Type 2 diabetes mellitus without complications: Secondary | ICD-10-CM | POA: Diagnosis not present

## 2015-10-13 DIAGNOSIS — K219 Gastro-esophageal reflux disease without esophagitis: Secondary | ICD-10-CM | POA: Diagnosis not present

## 2015-10-13 DIAGNOSIS — Z955 Presence of coronary angioplasty implant and graft: Secondary | ICD-10-CM | POA: Diagnosis not present

## 2015-10-13 DIAGNOSIS — I150 Renovascular hypertension: Secondary | ICD-10-CM | POA: Diagnosis not present

## 2015-10-13 DIAGNOSIS — Z79899 Other long term (current) drug therapy: Secondary | ICD-10-CM | POA: Diagnosis not present

## 2015-10-13 DIAGNOSIS — Z794 Long term (current) use of insulin: Secondary | ICD-10-CM | POA: Diagnosis not present

## 2015-10-13 DIAGNOSIS — Z4822 Encounter for aftercare following kidney transplant: Secondary | ICD-10-CM | POA: Diagnosis not present

## 2015-10-13 DIAGNOSIS — I517 Cardiomegaly: Secondary | ICD-10-CM | POA: Diagnosis not present

## 2015-10-13 DIAGNOSIS — D899 Disorder involving the immune mechanism, unspecified: Secondary | ICD-10-CM | POA: Diagnosis not present

## 2015-10-13 DIAGNOSIS — Z960 Presence of urogenital implants: Secondary | ICD-10-CM | POA: Diagnosis not present

## 2015-10-13 DIAGNOSIS — Z5181 Encounter for therapeutic drug level monitoring: Secondary | ICD-10-CM | POA: Diagnosis not present

## 2015-10-13 DIAGNOSIS — J90 Pleural effusion, not elsewhere classified: Secondary | ICD-10-CM | POA: Diagnosis not present

## 2015-10-13 DIAGNOSIS — I25119 Atherosclerotic heart disease of native coronary artery with unspecified angina pectoris: Secondary | ICD-10-CM | POA: Diagnosis not present

## 2015-10-13 DIAGNOSIS — Z940 Kidney transplant status: Secondary | ICD-10-CM | POA: Diagnosis not present

## 2015-10-13 DIAGNOSIS — I351 Nonrheumatic aortic (valve) insufficiency: Secondary | ICD-10-CM | POA: Diagnosis not present

## 2015-10-14 DIAGNOSIS — Z940 Kidney transplant status: Secondary | ICD-10-CM | POA: Diagnosis not present

## 2015-10-14 DIAGNOSIS — Z79899 Other long term (current) drug therapy: Secondary | ICD-10-CM | POA: Diagnosis not present

## 2015-10-14 DIAGNOSIS — Z4822 Encounter for aftercare following kidney transplant: Secondary | ICD-10-CM | POA: Diagnosis not present

## 2015-10-14 DIAGNOSIS — R079 Chest pain, unspecified: Secondary | ICD-10-CM | POA: Diagnosis not present

## 2015-10-14 DIAGNOSIS — E119 Type 2 diabetes mellitus without complications: Secondary | ICD-10-CM | POA: Diagnosis not present

## 2015-10-14 DIAGNOSIS — Z5181 Encounter for therapeutic drug level monitoring: Secondary | ICD-10-CM | POA: Diagnosis not present

## 2015-10-14 DIAGNOSIS — I25119 Atherosclerotic heart disease of native coronary artery with unspecified angina pectoris: Secondary | ICD-10-CM | POA: Diagnosis not present

## 2015-10-14 DIAGNOSIS — K219 Gastro-esophageal reflux disease without esophagitis: Secondary | ICD-10-CM | POA: Diagnosis not present

## 2015-10-15 DIAGNOSIS — I959 Hypotension, unspecified: Secondary | ICD-10-CM | POA: Diagnosis not present

## 2015-10-15 DIAGNOSIS — N141 Nephropathy induced by other drugs, medicaments and biological substances: Secondary | ICD-10-CM | POA: Diagnosis not present

## 2015-10-15 DIAGNOSIS — Z01810 Encounter for preprocedural cardiovascular examination: Secondary | ICD-10-CM | POA: Diagnosis not present

## 2015-10-15 DIAGNOSIS — Z5181 Encounter for therapeutic drug level monitoring: Secondary | ICD-10-CM | POA: Diagnosis not present

## 2015-10-15 DIAGNOSIS — Z4822 Encounter for aftercare following kidney transplant: Secondary | ICD-10-CM | POA: Diagnosis not present

## 2015-10-15 DIAGNOSIS — J984 Other disorders of lung: Secondary | ICD-10-CM | POA: Diagnosis not present

## 2015-10-15 DIAGNOSIS — E875 Hyperkalemia: Secondary | ICD-10-CM | POA: Diagnosis not present

## 2015-10-15 DIAGNOSIS — Z951 Presence of aortocoronary bypass graft: Secondary | ICD-10-CM | POA: Diagnosis not present

## 2015-10-15 DIAGNOSIS — Z940 Kidney transplant status: Secondary | ICD-10-CM | POA: Diagnosis not present

## 2015-10-15 DIAGNOSIS — Z8673 Personal history of transient ischemic attack (TIA), and cerebral infarction without residual deficits: Secondary | ICD-10-CM | POA: Diagnosis not present

## 2015-10-15 DIAGNOSIS — I2584 Coronary atherosclerosis due to calcified coronary lesion: Secondary | ICD-10-CM | POA: Diagnosis not present

## 2015-10-15 DIAGNOSIS — Z79899 Other long term (current) drug therapy: Secondary | ICD-10-CM | POA: Diagnosis not present

## 2015-10-15 DIAGNOSIS — R9401 Abnormal electroencephalogram [EEG]: Secondary | ICD-10-CM | POA: Diagnosis not present

## 2015-10-15 DIAGNOSIS — K219 Gastro-esophageal reflux disease without esophagitis: Secondary | ICD-10-CM | POA: Diagnosis present

## 2015-10-15 DIAGNOSIS — Z87891 Personal history of nicotine dependence: Secondary | ICD-10-CM | POA: Diagnosis not present

## 2015-10-15 DIAGNOSIS — R079 Chest pain, unspecified: Secondary | ICD-10-CM | POA: Diagnosis not present

## 2015-10-15 DIAGNOSIS — R339 Retention of urine, unspecified: Secondary | ICD-10-CM | POA: Diagnosis not present

## 2015-10-15 DIAGNOSIS — I452 Bifascicular block: Secondary | ICD-10-CM | POA: Diagnosis not present

## 2015-10-15 DIAGNOSIS — R259 Unspecified abnormal involuntary movements: Secondary | ICD-10-CM | POA: Diagnosis not present

## 2015-10-15 DIAGNOSIS — D631 Anemia in chronic kidney disease: Secondary | ICD-10-CM | POA: Diagnosis present

## 2015-10-15 DIAGNOSIS — T8619 Other complication of kidney transplant: Secondary | ICD-10-CM | POA: Diagnosis present

## 2015-10-15 DIAGNOSIS — I25119 Atherosclerotic heart disease of native coronary artery with unspecified angina pectoris: Secondary | ICD-10-CM | POA: Diagnosis present

## 2015-10-15 DIAGNOSIS — Z7982 Long term (current) use of aspirin: Secondary | ICD-10-CM | POA: Diagnosis not present

## 2015-10-15 DIAGNOSIS — Z930 Tracheostomy status: Secondary | ICD-10-CM | POA: Diagnosis not present

## 2015-10-15 DIAGNOSIS — R011 Cardiac murmur, unspecified: Secondary | ICD-10-CM | POA: Diagnosis not present

## 2015-10-15 DIAGNOSIS — E872 Acidosis: Secondary | ICD-10-CM | POA: Diagnosis not present

## 2015-10-15 DIAGNOSIS — J9811 Atelectasis: Secondary | ICD-10-CM | POA: Diagnosis not present

## 2015-10-15 DIAGNOSIS — I120 Hypertensive chronic kidney disease with stage 5 chronic kidney disease or end stage renal disease: Secondary | ICD-10-CM | POA: Diagnosis not present

## 2015-10-15 DIAGNOSIS — Z9889 Other specified postprocedural states: Secondary | ICD-10-CM | POA: Diagnosis not present

## 2015-10-15 DIAGNOSIS — E785 Hyperlipidemia, unspecified: Secondary | ICD-10-CM | POA: Diagnosis present

## 2015-10-15 DIAGNOSIS — N186 End stage renal disease: Secondary | ICD-10-CM | POA: Diagnosis not present

## 2015-10-15 DIAGNOSIS — E1165 Type 2 diabetes mellitus with hyperglycemia: Secondary | ICD-10-CM | POA: Diagnosis present

## 2015-10-15 DIAGNOSIS — I129 Hypertensive chronic kidney disease with stage 1 through stage 4 chronic kidney disease, or unspecified chronic kidney disease: Secondary | ICD-10-CM | POA: Diagnosis not present

## 2015-10-15 DIAGNOSIS — Z794 Long term (current) use of insulin: Secondary | ICD-10-CM | POA: Diagnosis not present

## 2015-10-15 DIAGNOSIS — I25118 Atherosclerotic heart disease of native coronary artery with other forms of angina pectoris: Secondary | ICD-10-CM | POA: Diagnosis not present

## 2015-10-15 DIAGNOSIS — Z9861 Coronary angioplasty status: Secondary | ICD-10-CM | POA: Diagnosis not present

## 2015-10-15 DIAGNOSIS — G959 Disease of spinal cord, unspecified: Secondary | ICD-10-CM | POA: Diagnosis not present

## 2015-10-15 DIAGNOSIS — R251 Tremor, unspecified: Secondary | ICD-10-CM | POA: Diagnosis not present

## 2015-10-15 DIAGNOSIS — G253 Myoclonus: Secondary | ICD-10-CM | POA: Diagnosis not present

## 2015-10-15 DIAGNOSIS — N179 Acute kidney failure, unspecified: Secondary | ICD-10-CM | POA: Diagnosis present

## 2015-10-15 DIAGNOSIS — Z955 Presence of coronary angioplasty implant and graft: Secondary | ICD-10-CM | POA: Diagnosis not present

## 2015-10-15 DIAGNOSIS — I340 Nonrheumatic mitral (valve) insufficiency: Secondary | ICD-10-CM | POA: Diagnosis not present

## 2015-10-15 DIAGNOSIS — I214 Non-ST elevation (NSTEMI) myocardial infarction: Secondary | ICD-10-CM | POA: Diagnosis present

## 2015-10-15 DIAGNOSIS — E119 Type 2 diabetes mellitus without complications: Secondary | ICD-10-CM | POA: Diagnosis not present

## 2015-10-15 DIAGNOSIS — I2510 Atherosclerotic heart disease of native coronary artery without angina pectoris: Secondary | ICD-10-CM | POA: Diagnosis not present

## 2015-10-15 DIAGNOSIS — N390 Urinary tract infection, site not specified: Secondary | ICD-10-CM | POA: Diagnosis present

## 2015-10-15 DIAGNOSIS — N401 Enlarged prostate with lower urinary tract symptoms: Secondary | ICD-10-CM | POA: Diagnosis present

## 2015-10-15 DIAGNOSIS — J90 Pleural effusion, not elsewhere classified: Secondary | ICD-10-CM | POA: Diagnosis not present

## 2015-10-15 DIAGNOSIS — R338 Other retention of urine: Secondary | ICD-10-CM | POA: Diagnosis present

## 2015-10-15 DIAGNOSIS — D62 Acute posthemorrhagic anemia: Secondary | ICD-10-CM | POA: Diagnosis not present

## 2015-10-15 DIAGNOSIS — E039 Hypothyroidism, unspecified: Secondary | ICD-10-CM | POA: Diagnosis present

## 2015-10-15 DIAGNOSIS — R278 Other lack of coordination: Secondary | ICD-10-CM | POA: Diagnosis not present

## 2015-10-16 DIAGNOSIS — Z940 Kidney transplant status: Secondary | ICD-10-CM | POA: Diagnosis not present

## 2015-10-16 DIAGNOSIS — J984 Other disorders of lung: Secondary | ICD-10-CM | POA: Diagnosis not present

## 2015-10-23 DIAGNOSIS — Z4822 Encounter for aftercare following kidney transplant: Secondary | ICD-10-CM | POA: Diagnosis not present

## 2015-10-23 DIAGNOSIS — I10 Essential (primary) hypertension: Secondary | ICD-10-CM | POA: Diagnosis not present

## 2015-10-23 DIAGNOSIS — R29898 Other symptoms and signs involving the musculoskeletal system: Secondary | ICD-10-CM | POA: Diagnosis not present

## 2015-10-23 DIAGNOSIS — I2510 Atherosclerotic heart disease of native coronary artery without angina pectoris: Secondary | ICD-10-CM | POA: Diagnosis not present

## 2015-10-23 DIAGNOSIS — R269 Unspecified abnormalities of gait and mobility: Secondary | ICD-10-CM | POA: Diagnosis not present

## 2015-10-23 DIAGNOSIS — E119 Type 2 diabetes mellitus without complications: Secondary | ICD-10-CM | POA: Diagnosis not present

## 2015-10-24 DIAGNOSIS — R600 Localized edema: Secondary | ICD-10-CM | POA: Diagnosis not present

## 2015-10-24 DIAGNOSIS — E119 Type 2 diabetes mellitus without complications: Secondary | ICD-10-CM | POA: Diagnosis not present

## 2015-10-24 DIAGNOSIS — E1122 Type 2 diabetes mellitus with diabetic chronic kidney disease: Secondary | ICD-10-CM | POA: Diagnosis not present

## 2015-10-24 DIAGNOSIS — E039 Hypothyroidism, unspecified: Secondary | ICD-10-CM | POA: Diagnosis not present

## 2015-10-24 DIAGNOSIS — Z992 Dependence on renal dialysis: Secondary | ICD-10-CM | POA: Diagnosis not present

## 2015-10-24 DIAGNOSIS — I10 Essential (primary) hypertension: Secondary | ICD-10-CM | POA: Diagnosis not present

## 2015-10-24 DIAGNOSIS — R339 Retention of urine, unspecified: Secondary | ICD-10-CM | POA: Diagnosis not present

## 2015-10-24 DIAGNOSIS — Z79899 Other long term (current) drug therapy: Secondary | ICD-10-CM | POA: Diagnosis not present

## 2015-10-24 DIAGNOSIS — E785 Hyperlipidemia, unspecified: Secondary | ICD-10-CM | POA: Diagnosis not present

## 2015-10-24 DIAGNOSIS — Z794 Long term (current) use of insulin: Secondary | ICD-10-CM | POA: Diagnosis not present

## 2015-10-24 DIAGNOSIS — N186 End stage renal disease: Secondary | ICD-10-CM | POA: Diagnosis not present

## 2015-10-24 DIAGNOSIS — I2510 Atherosclerotic heart disease of native coronary artery without angina pectoris: Secondary | ICD-10-CM | POA: Diagnosis not present

## 2015-10-24 DIAGNOSIS — I120 Hypertensive chronic kidney disease with stage 5 chronic kidney disease or end stage renal disease: Secondary | ICD-10-CM | POA: Diagnosis not present

## 2015-10-24 DIAGNOSIS — Z951 Presence of aortocoronary bypass graft: Secondary | ICD-10-CM | POA: Diagnosis not present

## 2015-10-24 DIAGNOSIS — D631 Anemia in chronic kidney disease: Secondary | ICD-10-CM | POA: Diagnosis not present

## 2015-10-24 DIAGNOSIS — Z7982 Long term (current) use of aspirin: Secondary | ICD-10-CM | POA: Diagnosis not present

## 2015-10-24 DIAGNOSIS — Z940 Kidney transplant status: Secondary | ICD-10-CM | POA: Diagnosis not present

## 2015-10-24 DIAGNOSIS — Z4822 Encounter for aftercare following kidney transplant: Secondary | ICD-10-CM | POA: Diagnosis not present

## 2015-10-24 DIAGNOSIS — D8989 Other specified disorders involving the immune mechanism, not elsewhere classified: Secondary | ICD-10-CM | POA: Diagnosis not present

## 2015-10-28 DIAGNOSIS — Z940 Kidney transplant status: Secondary | ICD-10-CM | POA: Diagnosis not present

## 2015-10-28 DIAGNOSIS — Z951 Presence of aortocoronary bypass graft: Secondary | ICD-10-CM | POA: Diagnosis not present

## 2015-10-28 DIAGNOSIS — Z4822 Encounter for aftercare following kidney transplant: Secondary | ICD-10-CM | POA: Diagnosis not present

## 2015-10-28 DIAGNOSIS — R339 Retention of urine, unspecified: Secondary | ICD-10-CM | POA: Diagnosis not present

## 2015-10-28 DIAGNOSIS — E119 Type 2 diabetes mellitus without complications: Secondary | ICD-10-CM | POA: Diagnosis not present

## 2015-10-28 DIAGNOSIS — N138 Other obstructive and reflux uropathy: Secondary | ICD-10-CM | POA: Diagnosis not present

## 2015-10-28 DIAGNOSIS — D899 Disorder involving the immune mechanism, unspecified: Secondary | ICD-10-CM | POA: Diagnosis not present

## 2015-10-28 DIAGNOSIS — N401 Enlarged prostate with lower urinary tract symptoms: Secondary | ICD-10-CM | POA: Diagnosis not present

## 2015-10-28 DIAGNOSIS — R609 Edema, unspecified: Secondary | ICD-10-CM | POA: Diagnosis not present

## 2015-10-28 DIAGNOSIS — Z794 Long term (current) use of insulin: Secondary | ICD-10-CM | POA: Diagnosis not present

## 2015-10-29 DIAGNOSIS — R29898 Other symptoms and signs involving the musculoskeletal system: Secondary | ICD-10-CM | POA: Diagnosis not present

## 2015-10-29 DIAGNOSIS — I10 Essential (primary) hypertension: Secondary | ICD-10-CM | POA: Diagnosis not present

## 2015-10-29 DIAGNOSIS — Z4822 Encounter for aftercare following kidney transplant: Secondary | ICD-10-CM | POA: Diagnosis not present

## 2015-10-29 DIAGNOSIS — I2510 Atherosclerotic heart disease of native coronary artery without angina pectoris: Secondary | ICD-10-CM | POA: Diagnosis not present

## 2015-10-29 DIAGNOSIS — E119 Type 2 diabetes mellitus without complications: Secondary | ICD-10-CM | POA: Diagnosis not present

## 2015-10-29 DIAGNOSIS — R269 Unspecified abnormalities of gait and mobility: Secondary | ICD-10-CM | POA: Diagnosis not present

## 2015-10-30 DIAGNOSIS — I2510 Atherosclerotic heart disease of native coronary artery without angina pectoris: Secondary | ICD-10-CM | POA: Diagnosis not present

## 2015-10-30 DIAGNOSIS — Z4822 Encounter for aftercare following kidney transplant: Secondary | ICD-10-CM | POA: Diagnosis not present

## 2015-10-30 DIAGNOSIS — R269 Unspecified abnormalities of gait and mobility: Secondary | ICD-10-CM | POA: Diagnosis not present

## 2015-10-30 DIAGNOSIS — I10 Essential (primary) hypertension: Secondary | ICD-10-CM | POA: Diagnosis not present

## 2015-10-30 DIAGNOSIS — R29898 Other symptoms and signs involving the musculoskeletal system: Secondary | ICD-10-CM | POA: Diagnosis not present

## 2015-10-30 DIAGNOSIS — E119 Type 2 diabetes mellitus without complications: Secondary | ICD-10-CM | POA: Diagnosis not present

## 2015-11-03 DIAGNOSIS — Z4822 Encounter for aftercare following kidney transplant: Secondary | ICD-10-CM | POA: Diagnosis not present

## 2015-11-03 DIAGNOSIS — I10 Essential (primary) hypertension: Secondary | ICD-10-CM | POA: Diagnosis not present

## 2015-11-03 DIAGNOSIS — I2510 Atherosclerotic heart disease of native coronary artery without angina pectoris: Secondary | ICD-10-CM | POA: Diagnosis not present

## 2015-11-03 DIAGNOSIS — R29898 Other symptoms and signs involving the musculoskeletal system: Secondary | ICD-10-CM | POA: Diagnosis not present

## 2015-11-03 DIAGNOSIS — R269 Unspecified abnormalities of gait and mobility: Secondary | ICD-10-CM | POA: Diagnosis not present

## 2015-11-03 DIAGNOSIS — E119 Type 2 diabetes mellitus without complications: Secondary | ICD-10-CM | POA: Diagnosis not present

## 2015-11-04 DIAGNOSIS — I25119 Atherosclerotic heart disease of native coronary artery with unspecified angina pectoris: Secondary | ICD-10-CM | POA: Diagnosis not present

## 2015-11-04 DIAGNOSIS — Z885 Allergy status to narcotic agent status: Secondary | ICD-10-CM | POA: Diagnosis not present

## 2015-11-04 DIAGNOSIS — Z940 Kidney transplant status: Secondary | ICD-10-CM | POA: Diagnosis not present

## 2015-11-04 DIAGNOSIS — E039 Hypothyroidism, unspecified: Secondary | ICD-10-CM | POA: Diagnosis not present

## 2015-11-04 DIAGNOSIS — E1122 Type 2 diabetes mellitus with diabetic chronic kidney disease: Secondary | ICD-10-CM | POA: Diagnosis not present

## 2015-11-04 DIAGNOSIS — E785 Hyperlipidemia, unspecified: Secondary | ICD-10-CM | POA: Diagnosis not present

## 2015-11-04 DIAGNOSIS — D8989 Other specified disorders involving the immune mechanism, not elsewhere classified: Secondary | ICD-10-CM | POA: Diagnosis not present

## 2015-11-04 DIAGNOSIS — R338 Other retention of urine: Secondary | ICD-10-CM | POA: Diagnosis not present

## 2015-11-04 DIAGNOSIS — Z4822 Encounter for aftercare following kidney transplant: Secondary | ICD-10-CM | POA: Diagnosis not present

## 2015-11-04 DIAGNOSIS — Z79899 Other long term (current) drug therapy: Secondary | ICD-10-CM | POA: Diagnosis not present

## 2015-11-04 DIAGNOSIS — N186 End stage renal disease: Secondary | ICD-10-CM | POA: Diagnosis not present

## 2015-11-04 DIAGNOSIS — I120 Hypertensive chronic kidney disease with stage 5 chronic kidney disease or end stage renal disease: Secondary | ICD-10-CM | POA: Diagnosis not present

## 2015-11-04 DIAGNOSIS — N401 Enlarged prostate with lower urinary tract symptoms: Secondary | ICD-10-CM | POA: Diagnosis not present

## 2015-11-04 DIAGNOSIS — Z7982 Long term (current) use of aspirin: Secondary | ICD-10-CM | POA: Diagnosis not present

## 2015-11-04 DIAGNOSIS — I150 Renovascular hypertension: Secondary | ICD-10-CM | POA: Diagnosis not present

## 2015-11-04 DIAGNOSIS — B9689 Other specified bacterial agents as the cause of diseases classified elsewhere: Secondary | ICD-10-CM | POA: Diagnosis not present

## 2015-11-04 DIAGNOSIS — R600 Localized edema: Secondary | ICD-10-CM | POA: Diagnosis not present

## 2015-11-04 DIAGNOSIS — Z794 Long term (current) use of insulin: Secondary | ICD-10-CM | POA: Diagnosis not present

## 2015-11-04 DIAGNOSIS — E119 Type 2 diabetes mellitus without complications: Secondary | ICD-10-CM | POA: Diagnosis not present

## 2015-11-04 DIAGNOSIS — Z882 Allergy status to sulfonamides status: Secondary | ICD-10-CM | POA: Diagnosis not present

## 2015-11-04 DIAGNOSIS — Z992 Dependence on renal dialysis: Secondary | ICD-10-CM | POA: Diagnosis not present

## 2015-11-04 DIAGNOSIS — I2510 Atherosclerotic heart disease of native coronary artery without angina pectoris: Secondary | ICD-10-CM | POA: Diagnosis not present

## 2015-11-04 DIAGNOSIS — E1169 Type 2 diabetes mellitus with other specified complication: Secondary | ICD-10-CM | POA: Diagnosis not present

## 2015-11-05 ENCOUNTER — Encounter: Payer: Self-pay | Admitting: *Deleted

## 2015-11-05 ENCOUNTER — Ambulatory Visit (INDEPENDENT_AMBULATORY_CARE_PROVIDER_SITE_OTHER): Payer: Medicare Other | Admitting: Cardiovascular Disease

## 2015-11-05 VITALS — BP 128/63 | HR 56 | Ht 69.0 in | Wt 152.0 lb

## 2015-11-05 DIAGNOSIS — I151 Hypertension secondary to other renal disorders: Secondary | ICD-10-CM

## 2015-11-05 DIAGNOSIS — Z951 Presence of aortocoronary bypass graft: Secondary | ICD-10-CM

## 2015-11-05 DIAGNOSIS — I350 Nonrheumatic aortic (valve) stenosis: Secondary | ICD-10-CM | POA: Diagnosis not present

## 2015-11-05 DIAGNOSIS — Z9289 Personal history of other medical treatment: Secondary | ICD-10-CM

## 2015-11-05 DIAGNOSIS — Z940 Kidney transplant status: Secondary | ICD-10-CM

## 2015-11-05 DIAGNOSIS — N2889 Other specified disorders of kidney and ureter: Secondary | ICD-10-CM

## 2015-11-05 DIAGNOSIS — E7800 Pure hypercholesterolemia, unspecified: Secondary | ICD-10-CM

## 2015-11-05 DIAGNOSIS — I6523 Occlusion and stenosis of bilateral carotid arteries: Secondary | ICD-10-CM | POA: Diagnosis not present

## 2015-11-05 NOTE — Patient Instructions (Signed)

## 2015-11-05 NOTE — Progress Notes (Signed)
SUBJECTIVE: The patient presents for follow-up of coronary artery disease. He underwent three-vessel coronary artery bypass graft surgery on 10/17/15 at [facility]. He also has a history of kidney transplant 09/08/15.  He was found to have 70% left main coronary stenosis and multivessel disease. He begins cardiac rehabilitation in November. He has struggled with urinary retention and has to perform catheterization at home 4 times daily. He has occasional chest pains relieved with Tylenol. He is wearing compression stockings and takes Lasix. He feels somewhat fatigued but his energy levels are gradually improving.   Review of Systems: As per "subjective", otherwise negative.  Allergies  Allergen Reactions  . Codeine Nausea Only  . Sulfa Antibiotics Nausea Only    Current Outpatient Prescriptions  Medication Sig Dispense Refill  . aspirin EC 81 MG tablet Take 162 mg by mouth daily.    . dapsone 25 MG tablet Take 2 tablets by mouth daily.    Marland Kitchen docusate sodium (COLACE) 100 MG capsule Take 100 mg by mouth 2 (two) times daily as needed.    . ferrous sulfate 325 (65 FE) MG tablet Take 325 mg by mouth daily.    . furosemide (LASIX) 80 MG tablet Take 1 tablet by mouth daily.    . insulin glargine (LANTUS SOLOSTAR) 100 UNIT/ML injection Inject 20 Units into the skin daily.     Marland Kitchen levothyroxine (SYNTHROID, LEVOTHROID) 125 MCG tablet Take 125 mcg by mouth daily before breakfast.    . magnesium oxide (MAG-OX) 400 MG tablet Take 1 tablet by mouth daily. 2 hours apart from myfortic    . metoprolol (LOPRESSOR) 50 MG tablet Take 1.5 tablets by mouth 2 (two) times daily.    . mycophenolate (MYFORTIC) 180 MG EC tablet Take 360 mg by mouth 2 (two) times daily.    . nitroGLYCERIN (NITROSTAT) 0.4 MG SL tablet Place 1 tablet (0.4 mg total) under the tongue every 5 (five) minutes x 3 doses as needed for chest pain. If no relief after 3 rd dose, proceed to the ED for an evaluation 25 tablet 3  . NOVOLOG  FLEXPEN 100 UNIT/ML FlexPen Inject 9 Units into the skin 3 (three) times daily. Plus sliding scale  1  . omeprazole (PRILOSEC) 20 MG capsule Take 1 capsule by mouth daily.    Marland Kitchen oxyCODONE (OXY IR/ROXICODONE) 5 MG immediate release tablet Take 5 mg by mouth every 4 (four) hours as needed. for pain  0  . polyethylene glycol powder (GLYCOLAX/MIRALAX) powder Take 17 g by mouth daily as needed.    . predniSONE (DELTASONE) 5 MG tablet Take 5 mg by mouth daily.    . simvastatin (ZOCOR) 40 MG tablet Take 1 tablet by mouth daily.    . Tacrolimus 1 MG TB24 Take 8 mg by mouth daily.    . tamsulosin (FLOMAX) 0.4 MG CAPS capsule Take 1 capsule by mouth at bedtime.  5  . valGANciclovir (VALCYTE) 450 MG tablet Take 1 tablet by mouth 3 (three) times a week.     No current facility-administered medications for this visit.    Facility-Administered Medications Ordered in Other Visits  Medication Dose Route Frequency Provider Last Rate Last Dose  . cyclopentolate-phenylephrine (CYCLOMYDRYL) 0.2-1 % ophthalmic solution 1 drop  1 drop Right Eye Once Williams Che, MD      . ketorolac (ACULAR) 0.5 % ophthalmic solution 1 drop  1 drop Right Eye Once Williams Che, MD        Past Medical  History:  Diagnosis Date  . Anemia   . Anginal pain ([place])    "a little."   . Aortic stenosis    Mild  . Chronic kidney disease    pt states 30% kidney function started dialysis 04/23/14 T/Th/Sa  . Constipation   . Coronary atherosclerosis of native coronary artery    DES LAD 8/03  . Essential hypertension, benign   . Heart murmur    "faint"  . Hypothyroidism   . Mixed hyperlipidemia   . Pneumonia   . Shortness of breath    with exertion  . Type 2 diabetes mellitus ([place])     Past Surgical History:  Procedure Laterality Date  . AV FISTULA PLACEMENT Right 05/08/2014   Procedure: ARTERIOVENOUS (AV) FISTULA CREATION-RIGHT;  Surgeon: Mal Misty, MD;  Location: [place];  Service: Vascular;  Laterality: Right;  .  CARDIAC CATHETERIZATION    . CATARACT EXTRACTION W/PHACO  02/07/2012   Procedure: CATARACT EXTRACTION PHACO AND INTRAOCULAR LENS PLACEMENT (IOC);  Surgeon: Williams Che, MD;  Location: [facility];  Service: Ophthalmology;  Laterality: Right;  CDE:24.87  . CATARACT EXTRACTION W/PHACO Left 04/17/2012   Procedure: CATARACT EXTRACTION PHACO AND INTRAOCULAR LENS PLACEMENT (IOC);  Surgeon: Williams Che, MD;  Location: [facility];  Service: Ophthalmology;  Laterality: Left;  CDE=20.75  . COLONOSCOPY    . cyst removed     from back  . INSERTION OF DIALYSIS CATHETER Right 04/22/2014   Procedure: INSERTION OF DIALYSIS CATHETER RIGHT INTERNAL JUGULAR VEIN;  Surgeon: Rosetta Posner, MD;  Location: [place];  Service: Vascular;  Laterality: Right;  . jaw implant    . NEPHRECTOMY     Left   . ROTATOR CUFF REPAIR Left    left  . VASECTOMY      Social History   Social History  . Marital status: Married    Spouse name: N/A  . Number of children: N/A  . Years of education: N/A   Occupational History  . Not on file.   Social History Main Topics  . Smoking status: Former Smoker    Packs/day: 1.50    Years: 11.00    Types: Cigarettes    Start date: 08/14/1955    Quit date: 12/15/1966  . Smokeless tobacco: Never Used  . Alcohol use No  . Drug use: No  . Sexual activity: Not on file   Other Topics Concern  . Not on file   Social History Narrative  . No narrative on file     Vitals:   11/05/15 1451  BP: 128/63  Pulse: (!) 56  Weight: 152 lb (68.9 kg)  Height: 5\' 9"  (1.753 m)    PHYSICAL EXAM General: NAD HEENT: Normal. Neck: No JVD, no thyromegaly. Lungs: Clear to auscultation bilaterally with normal respiratory effort. CV: Nondisplaced PMI.  Well healed midline sternotomy. Regular rate and rhythm, normal S1/S2, no S3/S4, no murmur. No pretibial or periankle edema.  No carotid bruit.   Abdomen: Soft, nontender, no distention.  Neurologic: Alert and oriented.  Psych: Normal  affect. Skin: Normal. Musculoskeletal: No gross deformities.    ECG: Most recent ECG reviewed.      ASSESSMENT AND PLAN: 1. CAD s/p 3-v CABG 10/17/15: Stable. Continue ASA, beta blocker, and statin. Begins cardiac rehab in 12/2015. Will obtain copies of echo report.  2. Essential HTN: Well controlled. No changes.  3. Aortic stenosis: Mild with a mean gradient of 7 mmHg. Continue to monitor with surveillance echocardiography. Will obtain results  from [facility].  4. Hyperlipidemia: Continue simvastatin 40 mg daily. Lipids reviewed at prior visit.  5. CKD, stage 5 s/p renal transplant 09/2015: Stable. On immunosuppressive therapy.  6. Carotid artery stenosis: Mild to moderate plaque disease bilaterally in 12/2013. Will repeat Dopplers if not done at [place]: f/u 4 months.  Time spent: 40 minutes, of which greater than 50% was spent reviewing symptoms, relevant blood tests and studies, and discussing management plan with the patient.   Kate Sable, M.D., F.A.C.C.

## 2015-11-09 DIAGNOSIS — R072 Precordial pain: Secondary | ICD-10-CM | POA: Diagnosis not present

## 2015-11-09 DIAGNOSIS — Z8619 Personal history of other infectious and parasitic diseases: Secondary | ICD-10-CM | POA: Diagnosis not present

## 2015-11-09 DIAGNOSIS — N3289 Other specified disorders of bladder: Secondary | ICD-10-CM | POA: Diagnosis present

## 2015-11-09 DIAGNOSIS — I4510 Unspecified right bundle-branch block: Secondary | ICD-10-CM | POA: Diagnosis not present

## 2015-11-09 DIAGNOSIS — N390 Urinary tract infection, site not specified: Secondary | ICD-10-CM | POA: Diagnosis present

## 2015-11-09 DIAGNOSIS — N401 Enlarged prostate with lower urinary tract symptoms: Secondary | ICD-10-CM | POA: Diagnosis present

## 2015-11-09 DIAGNOSIS — N186 End stage renal disease: Secondary | ICD-10-CM | POA: Diagnosis not present

## 2015-11-09 DIAGNOSIS — N400 Enlarged prostate without lower urinary tract symptoms: Secondary | ICD-10-CM | POA: Diagnosis not present

## 2015-11-09 DIAGNOSIS — Z79899 Other long term (current) drug therapy: Secondary | ICD-10-CM | POA: Diagnosis not present

## 2015-11-09 DIAGNOSIS — E109 Type 1 diabetes mellitus without complications: Secondary | ICD-10-CM | POA: Diagnosis not present

## 2015-11-09 DIAGNOSIS — I452 Bifascicular block: Secondary | ICD-10-CM | POA: Diagnosis not present

## 2015-11-09 DIAGNOSIS — R338 Other retention of urine: Secondary | ICD-10-CM | POA: Diagnosis present

## 2015-11-09 DIAGNOSIS — Z7982 Long term (current) use of aspirin: Secondary | ICD-10-CM | POA: Diagnosis not present

## 2015-11-09 DIAGNOSIS — N138 Other obstructive and reflux uropathy: Secondary | ICD-10-CM | POA: Diagnosis not present

## 2015-11-09 DIAGNOSIS — R7881 Bacteremia: Secondary | ICD-10-CM | POA: Diagnosis not present

## 2015-11-09 DIAGNOSIS — E1165 Type 2 diabetes mellitus with hyperglycemia: Secondary | ICD-10-CM | POA: Diagnosis not present

## 2015-11-09 DIAGNOSIS — I25119 Atherosclerotic heart disease of native coronary artery with unspecified angina pectoris: Secondary | ICD-10-CM | POA: Diagnosis not present

## 2015-11-09 DIAGNOSIS — D649 Anemia, unspecified: Secondary | ICD-10-CM | POA: Diagnosis present

## 2015-11-09 DIAGNOSIS — B9620 Unspecified Escherichia coli [E. coli] as the cause of diseases classified elsewhere: Secondary | ICD-10-CM | POA: Diagnosis present

## 2015-11-09 DIAGNOSIS — Z1624 Resistance to multiple antibiotics: Secondary | ICD-10-CM | POA: Diagnosis present

## 2015-11-09 DIAGNOSIS — I25810 Atherosclerosis of coronary artery bypass graft(s) without angina pectoris: Secondary | ICD-10-CM | POA: Diagnosis not present

## 2015-11-09 DIAGNOSIS — A498 Other bacterial infections of unspecified site: Secondary | ICD-10-CM | POA: Diagnosis not present

## 2015-11-09 DIAGNOSIS — Z951 Presence of aortocoronary bypass graft: Secondary | ICD-10-CM | POA: Diagnosis not present

## 2015-11-09 DIAGNOSIS — Z87440 Personal history of urinary (tract) infections: Secondary | ICD-10-CM | POA: Diagnosis not present

## 2015-11-09 DIAGNOSIS — Z4822 Encounter for aftercare following kidney transplant: Secondary | ICD-10-CM | POA: Diagnosis not present

## 2015-11-09 DIAGNOSIS — A419 Sepsis, unspecified organism: Secondary | ICD-10-CM | POA: Diagnosis present

## 2015-11-09 DIAGNOSIS — E119 Type 2 diabetes mellitus without complications: Secondary | ICD-10-CM | POA: Diagnosis not present

## 2015-11-09 DIAGNOSIS — I1311 Hypertensive heart and chronic kidney disease without heart failure, with stage 5 chronic kidney disease, or end stage renal disease: Secondary | ICD-10-CM | POA: Diagnosis not present

## 2015-11-09 DIAGNOSIS — Z882 Allergy status to sulfonamides status: Secondary | ICD-10-CM | POA: Diagnosis not present

## 2015-11-09 DIAGNOSIS — Z794 Long term (current) use of insulin: Secondary | ICD-10-CM | POA: Diagnosis not present

## 2015-11-09 DIAGNOSIS — I10 Essential (primary) hypertension: Secondary | ICD-10-CM | POA: Diagnosis not present

## 2015-11-09 DIAGNOSIS — B9689 Other specified bacterial agents as the cause of diseases classified elsewhere: Secondary | ICD-10-CM | POA: Diagnosis not present

## 2015-11-09 DIAGNOSIS — I150 Renovascular hypertension: Secondary | ICD-10-CM | POA: Diagnosis not present

## 2015-11-09 DIAGNOSIS — R339 Retention of urine, unspecified: Secondary | ICD-10-CM | POA: Diagnosis not present

## 2015-11-09 DIAGNOSIS — I2510 Atherosclerotic heart disease of native coronary artery without angina pectoris: Secondary | ICD-10-CM | POA: Diagnosis present

## 2015-11-09 DIAGNOSIS — E039 Hypothyroidism, unspecified: Secondary | ICD-10-CM | POA: Diagnosis present

## 2015-11-09 DIAGNOSIS — Z87891 Personal history of nicotine dependence: Secondary | ICD-10-CM | POA: Diagnosis not present

## 2015-11-09 DIAGNOSIS — Z5181 Encounter for therapeutic drug level monitoring: Secondary | ICD-10-CM | POA: Diagnosis not present

## 2015-11-09 DIAGNOSIS — Z940 Kidney transplant status: Secondary | ICD-10-CM | POA: Diagnosis not present

## 2015-11-14 DIAGNOSIS — N390 Urinary tract infection, site not specified: Secondary | ICD-10-CM | POA: Diagnosis not present

## 2015-11-14 DIAGNOSIS — B9620 Unspecified Escherichia coli [E. coli] as the cause of diseases classified elsewhere: Secondary | ICD-10-CM | POA: Diagnosis not present

## 2015-11-14 DIAGNOSIS — N186 End stage renal disease: Secondary | ICD-10-CM | POA: Diagnosis not present

## 2015-11-14 DIAGNOSIS — Z794 Long term (current) use of insulin: Secondary | ICD-10-CM | POA: Diagnosis not present

## 2015-11-14 DIAGNOSIS — R072 Precordial pain: Secondary | ICD-10-CM | POA: Diagnosis not present

## 2015-11-14 DIAGNOSIS — R29898 Other symptoms and signs involving the musculoskeletal system: Secondary | ICD-10-CM | POA: Diagnosis not present

## 2015-11-14 DIAGNOSIS — N401 Enlarged prostate with lower urinary tract symptoms: Secondary | ICD-10-CM | POA: Diagnosis not present

## 2015-11-14 DIAGNOSIS — A419 Sepsis, unspecified organism: Secondary | ICD-10-CM | POA: Diagnosis not present

## 2015-11-14 DIAGNOSIS — Z9289 Personal history of other medical treatment: Secondary | ICD-10-CM | POA: Diagnosis not present

## 2015-11-14 DIAGNOSIS — N138 Other obstructive and reflux uropathy: Secondary | ICD-10-CM | POA: Diagnosis not present

## 2015-11-14 DIAGNOSIS — Z951 Presence of aortocoronary bypass graft: Secondary | ICD-10-CM | POA: Diagnosis not present

## 2015-11-14 DIAGNOSIS — E1122 Type 2 diabetes mellitus with diabetic chronic kidney disease: Secondary | ICD-10-CM | POA: Diagnosis not present

## 2015-11-14 DIAGNOSIS — Z940 Kidney transplant status: Secondary | ICD-10-CM | POA: Diagnosis not present

## 2015-11-15 DIAGNOSIS — N401 Enlarged prostate with lower urinary tract symptoms: Secondary | ICD-10-CM | POA: Diagnosis not present

## 2015-11-15 DIAGNOSIS — Z940 Kidney transplant status: Secondary | ICD-10-CM | POA: Diagnosis not present

## 2015-11-15 DIAGNOSIS — R072 Precordial pain: Secondary | ICD-10-CM | POA: Diagnosis not present

## 2015-11-15 DIAGNOSIS — A419 Sepsis, unspecified organism: Secondary | ICD-10-CM | POA: Diagnosis not present

## 2015-11-15 DIAGNOSIS — N390 Urinary tract infection, site not specified: Secondary | ICD-10-CM | POA: Diagnosis not present

## 2015-11-15 DIAGNOSIS — Z951 Presence of aortocoronary bypass graft: Secondary | ICD-10-CM | POA: Diagnosis not present

## 2015-11-16 DIAGNOSIS — Z940 Kidney transplant status: Secondary | ICD-10-CM | POA: Diagnosis not present

## 2015-11-16 DIAGNOSIS — N390 Urinary tract infection, site not specified: Secondary | ICD-10-CM | POA: Diagnosis not present

## 2015-11-16 DIAGNOSIS — Z951 Presence of aortocoronary bypass graft: Secondary | ICD-10-CM | POA: Diagnosis not present

## 2015-11-16 DIAGNOSIS — A419 Sepsis, unspecified organism: Secondary | ICD-10-CM | POA: Diagnosis not present

## 2015-11-16 DIAGNOSIS — N401 Enlarged prostate with lower urinary tract symptoms: Secondary | ICD-10-CM | POA: Diagnosis not present

## 2015-11-16 DIAGNOSIS — R072 Precordial pain: Secondary | ICD-10-CM | POA: Diagnosis not present

## 2015-11-17 DIAGNOSIS — Z940 Kidney transplant status: Secondary | ICD-10-CM | POA: Diagnosis not present

## 2015-11-17 DIAGNOSIS — A419 Sepsis, unspecified organism: Secondary | ICD-10-CM | POA: Diagnosis not present

## 2015-11-17 DIAGNOSIS — N401 Enlarged prostate with lower urinary tract symptoms: Secondary | ICD-10-CM | POA: Diagnosis not present

## 2015-11-17 DIAGNOSIS — R072 Precordial pain: Secondary | ICD-10-CM | POA: Diagnosis not present

## 2015-11-17 DIAGNOSIS — N390 Urinary tract infection, site not specified: Secondary | ICD-10-CM | POA: Diagnosis not present

## 2015-11-17 DIAGNOSIS — Z951 Presence of aortocoronary bypass graft: Secondary | ICD-10-CM | POA: Diagnosis not present

## 2015-11-18 DIAGNOSIS — D899 Disorder involving the immune mechanism, unspecified: Secondary | ICD-10-CM | POA: Diagnosis not present

## 2015-11-18 DIAGNOSIS — Z4822 Encounter for aftercare following kidney transplant: Secondary | ICD-10-CM | POA: Diagnosis not present

## 2015-11-18 DIAGNOSIS — Z794 Long term (current) use of insulin: Secondary | ICD-10-CM | POA: Diagnosis not present

## 2015-11-18 DIAGNOSIS — D8989 Other specified disorders involving the immune mechanism, not elsewhere classified: Secondary | ICD-10-CM | POA: Diagnosis not present

## 2015-11-18 DIAGNOSIS — Z885 Allergy status to narcotic agent status: Secondary | ICD-10-CM | POA: Diagnosis not present

## 2015-11-18 DIAGNOSIS — E785 Hyperlipidemia, unspecified: Secondary | ICD-10-CM | POA: Diagnosis not present

## 2015-11-18 DIAGNOSIS — Z940 Kidney transplant status: Secondary | ICD-10-CM | POA: Diagnosis not present

## 2015-11-18 DIAGNOSIS — D649 Anemia, unspecified: Secondary | ICD-10-CM | POA: Diagnosis not present

## 2015-11-18 DIAGNOSIS — N186 End stage renal disease: Secondary | ICD-10-CM | POA: Diagnosis not present

## 2015-11-18 DIAGNOSIS — N401 Enlarged prostate with lower urinary tract symptoms: Secondary | ICD-10-CM | POA: Diagnosis not present

## 2015-11-18 DIAGNOSIS — R072 Precordial pain: Secondary | ICD-10-CM | POA: Diagnosis not present

## 2015-11-18 DIAGNOSIS — Z9889 Other specified postprocedural states: Secondary | ICD-10-CM | POA: Diagnosis not present

## 2015-11-18 DIAGNOSIS — E039 Hypothyroidism, unspecified: Secondary | ICD-10-CM | POA: Diagnosis not present

## 2015-11-18 DIAGNOSIS — Z882 Allergy status to sulfonamides status: Secondary | ICD-10-CM | POA: Diagnosis not present

## 2015-11-18 DIAGNOSIS — Z79899 Other long term (current) drug therapy: Secondary | ICD-10-CM | POA: Diagnosis not present

## 2015-11-18 DIAGNOSIS — I2510 Atherosclerotic heart disease of native coronary artery without angina pectoris: Secondary | ICD-10-CM | POA: Diagnosis not present

## 2015-11-18 DIAGNOSIS — Z951 Presence of aortocoronary bypass graft: Secondary | ICD-10-CM | POA: Diagnosis not present

## 2015-11-18 DIAGNOSIS — B9689 Other specified bacterial agents as the cause of diseases classified elsewhere: Secondary | ICD-10-CM | POA: Diagnosis not present

## 2015-11-18 DIAGNOSIS — A419 Sepsis, unspecified organism: Secondary | ICD-10-CM | POA: Diagnosis not present

## 2015-11-18 DIAGNOSIS — Z992 Dependence on renal dialysis: Secondary | ICD-10-CM | POA: Diagnosis not present

## 2015-11-18 DIAGNOSIS — N390 Urinary tract infection, site not specified: Secondary | ICD-10-CM | POA: Diagnosis not present

## 2015-11-18 DIAGNOSIS — E1122 Type 2 diabetes mellitus with diabetic chronic kidney disease: Secondary | ICD-10-CM | POA: Diagnosis not present

## 2015-11-18 DIAGNOSIS — I120 Hypertensive chronic kidney disease with stage 5 chronic kidney disease or end stage renal disease: Secondary | ICD-10-CM | POA: Diagnosis not present

## 2015-11-18 DIAGNOSIS — Z7982 Long term (current) use of aspirin: Secondary | ICD-10-CM | POA: Diagnosis not present

## 2015-11-19 DIAGNOSIS — Z951 Presence of aortocoronary bypass graft: Secondary | ICD-10-CM | POA: Diagnosis not present

## 2015-11-19 DIAGNOSIS — N186 End stage renal disease: Secondary | ICD-10-CM | POA: Diagnosis not present

## 2015-11-19 DIAGNOSIS — N390 Urinary tract infection, site not specified: Secondary | ICD-10-CM | POA: Diagnosis not present

## 2015-11-19 DIAGNOSIS — N401 Enlarged prostate with lower urinary tract symptoms: Secondary | ICD-10-CM | POA: Diagnosis not present

## 2015-11-19 DIAGNOSIS — R072 Precordial pain: Secondary | ICD-10-CM | POA: Diagnosis not present

## 2015-11-19 DIAGNOSIS — A419 Sepsis, unspecified organism: Secondary | ICD-10-CM | POA: Diagnosis not present

## 2015-11-19 DIAGNOSIS — Z940 Kidney transplant status: Secondary | ICD-10-CM | POA: Diagnosis not present

## 2015-11-20 DIAGNOSIS — Z940 Kidney transplant status: Secondary | ICD-10-CM | POA: Diagnosis not present

## 2015-11-20 DIAGNOSIS — Z951 Presence of aortocoronary bypass graft: Secondary | ICD-10-CM | POA: Diagnosis not present

## 2015-11-20 DIAGNOSIS — R072 Precordial pain: Secondary | ICD-10-CM | POA: Diagnosis not present

## 2015-11-20 DIAGNOSIS — N401 Enlarged prostate with lower urinary tract symptoms: Secondary | ICD-10-CM | POA: Diagnosis not present

## 2015-11-20 DIAGNOSIS — N390 Urinary tract infection, site not specified: Secondary | ICD-10-CM | POA: Diagnosis not present

## 2015-11-20 DIAGNOSIS — A419 Sepsis, unspecified organism: Secondary | ICD-10-CM | POA: Diagnosis not present

## 2015-11-21 ENCOUNTER — Ambulatory Visit: Payer: Medicare Other | Admitting: Cardiovascular Disease

## 2015-11-21 DIAGNOSIS — N401 Enlarged prostate with lower urinary tract symptoms: Secondary | ICD-10-CM | POA: Diagnosis not present

## 2015-11-21 DIAGNOSIS — R072 Precordial pain: Secondary | ICD-10-CM | POA: Diagnosis not present

## 2015-11-21 DIAGNOSIS — A419 Sepsis, unspecified organism: Secondary | ICD-10-CM | POA: Diagnosis not present

## 2015-11-21 DIAGNOSIS — Z940 Kidney transplant status: Secondary | ICD-10-CM | POA: Diagnosis not present

## 2015-11-21 DIAGNOSIS — Z951 Presence of aortocoronary bypass graft: Secondary | ICD-10-CM | POA: Diagnosis not present

## 2015-11-21 DIAGNOSIS — N390 Urinary tract infection, site not specified: Secondary | ICD-10-CM | POA: Diagnosis not present

## 2015-11-22 DIAGNOSIS — Z951 Presence of aortocoronary bypass graft: Secondary | ICD-10-CM | POA: Diagnosis not present

## 2015-11-22 DIAGNOSIS — N401 Enlarged prostate with lower urinary tract symptoms: Secondary | ICD-10-CM | POA: Diagnosis not present

## 2015-11-22 DIAGNOSIS — A419 Sepsis, unspecified organism: Secondary | ICD-10-CM | POA: Diagnosis not present

## 2015-11-22 DIAGNOSIS — Z940 Kidney transplant status: Secondary | ICD-10-CM | POA: Diagnosis not present

## 2015-11-22 DIAGNOSIS — N390 Urinary tract infection, site not specified: Secondary | ICD-10-CM | POA: Diagnosis not present

## 2015-11-22 DIAGNOSIS — R072 Precordial pain: Secondary | ICD-10-CM | POA: Diagnosis not present

## 2015-11-23 DIAGNOSIS — R072 Precordial pain: Secondary | ICD-10-CM | POA: Diagnosis not present

## 2015-11-23 DIAGNOSIS — A419 Sepsis, unspecified organism: Secondary | ICD-10-CM | POA: Diagnosis not present

## 2015-11-23 DIAGNOSIS — Z940 Kidney transplant status: Secondary | ICD-10-CM | POA: Diagnosis not present

## 2015-11-23 DIAGNOSIS — Z951 Presence of aortocoronary bypass graft: Secondary | ICD-10-CM | POA: Diagnosis not present

## 2015-11-23 DIAGNOSIS — N390 Urinary tract infection, site not specified: Secondary | ICD-10-CM | POA: Diagnosis not present

## 2015-11-23 DIAGNOSIS — N401 Enlarged prostate with lower urinary tract symptoms: Secondary | ICD-10-CM | POA: Diagnosis not present

## 2015-11-24 DIAGNOSIS — Z951 Presence of aortocoronary bypass graft: Secondary | ICD-10-CM | POA: Diagnosis not present

## 2015-11-24 DIAGNOSIS — N401 Enlarged prostate with lower urinary tract symptoms: Secondary | ICD-10-CM | POA: Diagnosis not present

## 2015-11-24 DIAGNOSIS — R072 Precordial pain: Secondary | ICD-10-CM | POA: Diagnosis not present

## 2015-11-24 DIAGNOSIS — Z940 Kidney transplant status: Secondary | ICD-10-CM | POA: Diagnosis not present

## 2015-11-24 DIAGNOSIS — A419 Sepsis, unspecified organism: Secondary | ICD-10-CM | POA: Diagnosis not present

## 2015-11-24 DIAGNOSIS — N390 Urinary tract infection, site not specified: Secondary | ICD-10-CM | POA: Diagnosis not present

## 2015-11-25 DIAGNOSIS — N390 Urinary tract infection, site not specified: Secondary | ICD-10-CM | POA: Diagnosis not present

## 2015-11-25 DIAGNOSIS — A419 Sepsis, unspecified organism: Secondary | ICD-10-CM | POA: Diagnosis not present

## 2015-11-25 DIAGNOSIS — Z951 Presence of aortocoronary bypass graft: Secondary | ICD-10-CM | POA: Diagnosis not present

## 2015-11-25 DIAGNOSIS — N401 Enlarged prostate with lower urinary tract symptoms: Secondary | ICD-10-CM | POA: Diagnosis not present

## 2015-11-25 DIAGNOSIS — R072 Precordial pain: Secondary | ICD-10-CM | POA: Diagnosis not present

## 2015-11-25 DIAGNOSIS — Z940 Kidney transplant status: Secondary | ICD-10-CM | POA: Diagnosis not present

## 2015-11-26 DIAGNOSIS — A498 Other bacterial infections of unspecified site: Secondary | ICD-10-CM | POA: Diagnosis not present

## 2015-11-26 DIAGNOSIS — N400 Enlarged prostate without lower urinary tract symptoms: Secondary | ICD-10-CM | POA: Diagnosis not present

## 2015-11-26 DIAGNOSIS — Z9889 Other specified postprocedural states: Secondary | ICD-10-CM | POA: Diagnosis not present

## 2015-11-26 DIAGNOSIS — I2510 Atherosclerotic heart disease of native coronary artery without angina pectoris: Secondary | ICD-10-CM | POA: Diagnosis not present

## 2015-11-26 DIAGNOSIS — Z9289 Personal history of other medical treatment: Secondary | ICD-10-CM | POA: Diagnosis not present

## 2015-11-26 DIAGNOSIS — Z79899 Other long term (current) drug therapy: Secondary | ICD-10-CM | POA: Diagnosis not present

## 2015-11-26 DIAGNOSIS — Z940 Kidney transplant status: Secondary | ICD-10-CM | POA: Diagnosis not present

## 2015-11-26 DIAGNOSIS — E785 Hyperlipidemia, unspecified: Secondary | ICD-10-CM | POA: Diagnosis not present

## 2015-11-26 DIAGNOSIS — Z882 Allergy status to sulfonamides status: Secondary | ICD-10-CM | POA: Diagnosis not present

## 2015-11-26 DIAGNOSIS — R338 Other retention of urine: Secondary | ICD-10-CM | POA: Diagnosis not present

## 2015-11-26 DIAGNOSIS — E039 Hypothyroidism, unspecified: Secondary | ICD-10-CM | POA: Diagnosis not present

## 2015-11-26 DIAGNOSIS — E1122 Type 2 diabetes mellitus with diabetic chronic kidney disease: Secondary | ICD-10-CM | POA: Diagnosis not present

## 2015-11-26 DIAGNOSIS — Z992 Dependence on renal dialysis: Secondary | ICD-10-CM | POA: Diagnosis not present

## 2015-11-26 DIAGNOSIS — Z4822 Encounter for aftercare following kidney transplant: Secondary | ICD-10-CM | POA: Diagnosis not present

## 2015-11-26 DIAGNOSIS — I120 Hypertensive chronic kidney disease with stage 5 chronic kidney disease or end stage renal disease: Secondary | ICD-10-CM | POA: Diagnosis not present

## 2015-11-26 DIAGNOSIS — N401 Enlarged prostate with lower urinary tract symptoms: Secondary | ICD-10-CM | POA: Diagnosis not present

## 2015-11-26 DIAGNOSIS — Z951 Presence of aortocoronary bypass graft: Secondary | ICD-10-CM | POA: Diagnosis not present

## 2015-11-26 DIAGNOSIS — Z885 Allergy status to narcotic agent status: Secondary | ICD-10-CM | POA: Diagnosis not present

## 2015-11-26 DIAGNOSIS — F329 Major depressive disorder, single episode, unspecified: Secondary | ICD-10-CM | POA: Diagnosis not present

## 2015-11-26 DIAGNOSIS — Z7982 Long term (current) use of aspirin: Secondary | ICD-10-CM | POA: Diagnosis not present

## 2015-11-26 DIAGNOSIS — D8989 Other specified disorders involving the immune mechanism, not elsewhere classified: Secondary | ICD-10-CM | POA: Diagnosis not present

## 2015-11-26 DIAGNOSIS — Z794 Long term (current) use of insulin: Secondary | ICD-10-CM | POA: Diagnosis not present

## 2015-11-26 DIAGNOSIS — D649 Anemia, unspecified: Secondary | ICD-10-CM | POA: Diagnosis not present

## 2015-11-26 DIAGNOSIS — Z5181 Encounter for therapeutic drug level monitoring: Secondary | ICD-10-CM | POA: Diagnosis not present

## 2015-11-26 DIAGNOSIS — E119 Type 2 diabetes mellitus without complications: Secondary | ICD-10-CM | POA: Diagnosis not present

## 2015-11-26 DIAGNOSIS — N186 End stage renal disease: Secondary | ICD-10-CM | POA: Diagnosis not present

## 2015-11-26 DIAGNOSIS — R8299 Other abnormal findings in urine: Secondary | ICD-10-CM | POA: Diagnosis not present

## 2015-11-27 DIAGNOSIS — Z79899 Other long term (current) drug therapy: Secondary | ICD-10-CM | POA: Diagnosis not present

## 2015-11-27 DIAGNOSIS — N390 Urinary tract infection, site not specified: Secondary | ICD-10-CM | POA: Diagnosis not present

## 2015-11-27 DIAGNOSIS — Z5181 Encounter for therapeutic drug level monitoring: Secondary | ICD-10-CM | POA: Diagnosis not present

## 2015-11-27 DIAGNOSIS — Z792 Long term (current) use of antibiotics: Secondary | ICD-10-CM | POA: Diagnosis not present

## 2015-11-27 DIAGNOSIS — B9620 Unspecified Escherichia coli [E. coli] as the cause of diseases classified elsewhere: Secondary | ICD-10-CM | POA: Diagnosis not present

## 2015-11-27 DIAGNOSIS — Z7952 Long term (current) use of systemic steroids: Secondary | ICD-10-CM | POA: Diagnosis not present

## 2015-11-27 DIAGNOSIS — N401 Enlarged prostate with lower urinary tract symptoms: Secondary | ICD-10-CM | POA: Diagnosis not present

## 2015-11-27 DIAGNOSIS — Z09 Encounter for follow-up examination after completed treatment for conditions other than malignant neoplasm: Secondary | ICD-10-CM | POA: Diagnosis not present

## 2015-11-27 DIAGNOSIS — A419 Sepsis, unspecified organism: Secondary | ICD-10-CM | POA: Diagnosis not present

## 2015-11-27 DIAGNOSIS — R072 Precordial pain: Secondary | ICD-10-CM | POA: Diagnosis not present

## 2015-11-27 DIAGNOSIS — Z951 Presence of aortocoronary bypass graft: Secondary | ICD-10-CM | POA: Diagnosis not present

## 2015-11-27 DIAGNOSIS — I120 Hypertensive chronic kidney disease with stage 5 chronic kidney disease or end stage renal disease: Secondary | ICD-10-CM | POA: Diagnosis not present

## 2015-11-27 DIAGNOSIS — Z940 Kidney transplant status: Secondary | ICD-10-CM | POA: Diagnosis not present

## 2015-11-27 DIAGNOSIS — N186 End stage renal disease: Secondary | ICD-10-CM | POA: Diagnosis not present

## 2015-11-27 DIAGNOSIS — Z7982 Long term (current) use of aspirin: Secondary | ICD-10-CM | POA: Diagnosis not present

## 2015-11-27 DIAGNOSIS — E1122 Type 2 diabetes mellitus with diabetic chronic kidney disease: Secondary | ICD-10-CM | POA: Diagnosis not present

## 2015-11-28 DIAGNOSIS — R072 Precordial pain: Secondary | ICD-10-CM | POA: Diagnosis not present

## 2015-11-28 DIAGNOSIS — Z951 Presence of aortocoronary bypass graft: Secondary | ICD-10-CM | POA: Diagnosis not present

## 2015-11-28 DIAGNOSIS — Z940 Kidney transplant status: Secondary | ICD-10-CM | POA: Diagnosis not present

## 2015-11-28 DIAGNOSIS — A419 Sepsis, unspecified organism: Secondary | ICD-10-CM | POA: Diagnosis not present

## 2015-11-28 DIAGNOSIS — N401 Enlarged prostate with lower urinary tract symptoms: Secondary | ICD-10-CM | POA: Diagnosis not present

## 2015-11-28 DIAGNOSIS — N390 Urinary tract infection, site not specified: Secondary | ICD-10-CM | POA: Diagnosis not present

## 2015-11-29 DIAGNOSIS — Z951 Presence of aortocoronary bypass graft: Secondary | ICD-10-CM | POA: Diagnosis not present

## 2015-11-29 DIAGNOSIS — A419 Sepsis, unspecified organism: Secondary | ICD-10-CM | POA: Diagnosis not present

## 2015-11-29 DIAGNOSIS — N401 Enlarged prostate with lower urinary tract symptoms: Secondary | ICD-10-CM | POA: Diagnosis not present

## 2015-11-29 DIAGNOSIS — R072 Precordial pain: Secondary | ICD-10-CM | POA: Diagnosis not present

## 2015-11-29 DIAGNOSIS — N390 Urinary tract infection, site not specified: Secondary | ICD-10-CM | POA: Diagnosis not present

## 2015-11-29 DIAGNOSIS — Z940 Kidney transplant status: Secondary | ICD-10-CM | POA: Diagnosis not present

## 2015-11-30 DIAGNOSIS — N390 Urinary tract infection, site not specified: Secondary | ICD-10-CM | POA: Diagnosis not present

## 2015-11-30 DIAGNOSIS — Z951 Presence of aortocoronary bypass graft: Secondary | ICD-10-CM | POA: Diagnosis not present

## 2015-11-30 DIAGNOSIS — N401 Enlarged prostate with lower urinary tract symptoms: Secondary | ICD-10-CM | POA: Diagnosis not present

## 2015-11-30 DIAGNOSIS — Z940 Kidney transplant status: Secondary | ICD-10-CM | POA: Diagnosis not present

## 2015-11-30 DIAGNOSIS — A419 Sepsis, unspecified organism: Secondary | ICD-10-CM | POA: Diagnosis not present

## 2015-11-30 DIAGNOSIS — R072 Precordial pain: Secondary | ICD-10-CM | POA: Diagnosis not present

## 2015-12-01 ENCOUNTER — Encounter (HOSPITAL_COMMUNITY): Admission: RE | Admit: 2015-12-01 | Payer: Medicare Other | Source: Ambulatory Visit

## 2015-12-01 DIAGNOSIS — Z940 Kidney transplant status: Secondary | ICD-10-CM | POA: Diagnosis not present

## 2015-12-01 DIAGNOSIS — N401 Enlarged prostate with lower urinary tract symptoms: Secondary | ICD-10-CM | POA: Diagnosis not present

## 2015-12-01 DIAGNOSIS — A419 Sepsis, unspecified organism: Secondary | ICD-10-CM | POA: Diagnosis not present

## 2015-12-01 DIAGNOSIS — R072 Precordial pain: Secondary | ICD-10-CM | POA: Diagnosis not present

## 2015-12-01 DIAGNOSIS — N390 Urinary tract infection, site not specified: Secondary | ICD-10-CM | POA: Diagnosis not present

## 2015-12-01 DIAGNOSIS — Z951 Presence of aortocoronary bypass graft: Secondary | ICD-10-CM | POA: Diagnosis not present

## 2015-12-02 DIAGNOSIS — R338 Other retention of urine: Secondary | ICD-10-CM | POA: Diagnosis not present

## 2015-12-02 DIAGNOSIS — D809 Immunodeficiency with predominantly antibody defects, unspecified: Secondary | ICD-10-CM | POA: Diagnosis not present

## 2015-12-02 DIAGNOSIS — D8989 Other specified disorders involving the immune mechanism, not elsewhere classified: Secondary | ICD-10-CM | POA: Diagnosis not present

## 2015-12-02 DIAGNOSIS — N390 Urinary tract infection, site not specified: Secondary | ICD-10-CM | POA: Diagnosis not present

## 2015-12-02 DIAGNOSIS — R7881 Bacteremia: Secondary | ICD-10-CM | POA: Diagnosis not present

## 2015-12-02 DIAGNOSIS — R072 Precordial pain: Secondary | ICD-10-CM | POA: Diagnosis not present

## 2015-12-02 DIAGNOSIS — Z951 Presence of aortocoronary bypass graft: Secondary | ICD-10-CM | POA: Diagnosis not present

## 2015-12-02 DIAGNOSIS — A419 Sepsis, unspecified organism: Secondary | ICD-10-CM | POA: Diagnosis not present

## 2015-12-02 DIAGNOSIS — R600 Localized edema: Secondary | ICD-10-CM | POA: Diagnosis not present

## 2015-12-02 DIAGNOSIS — B999 Unspecified infectious disease: Secondary | ICD-10-CM | POA: Diagnosis not present

## 2015-12-02 DIAGNOSIS — N401 Enlarged prostate with lower urinary tract symptoms: Secondary | ICD-10-CM | POA: Diagnosis not present

## 2015-12-02 DIAGNOSIS — Z940 Kidney transplant status: Secondary | ICD-10-CM | POA: Diagnosis not present

## 2015-12-03 DIAGNOSIS — E119 Type 2 diabetes mellitus without complications: Secondary | ICD-10-CM | POA: Diagnosis not present

## 2015-12-03 DIAGNOSIS — M5030 Other cervical disc degeneration, unspecified cervical region: Secondary | ICD-10-CM | POA: Diagnosis not present

## 2015-12-03 DIAGNOSIS — S199XXA Unspecified injury of neck, initial encounter: Secondary | ICD-10-CM | POA: Diagnosis not present

## 2015-12-03 DIAGNOSIS — N390 Urinary tract infection, site not specified: Secondary | ICD-10-CM | POA: Diagnosis not present

## 2015-12-03 DIAGNOSIS — S0181XA Laceration without foreign body of other part of head, initial encounter: Secondary | ICD-10-CM | POA: Diagnosis not present

## 2015-12-03 DIAGNOSIS — Z794 Long term (current) use of insulin: Secondary | ICD-10-CM | POA: Diagnosis not present

## 2015-12-03 DIAGNOSIS — S0190XA Unspecified open wound of unspecified part of head, initial encounter: Secondary | ICD-10-CM | POA: Diagnosis not present

## 2015-12-03 DIAGNOSIS — S098XXA Other specified injuries of head, initial encounter: Secondary | ICD-10-CM | POA: Diagnosis not present

## 2015-12-03 DIAGNOSIS — S0990XA Unspecified injury of head, initial encounter: Secondary | ICD-10-CM | POA: Diagnosis not present

## 2015-12-03 DIAGNOSIS — Z992 Dependence on renal dialysis: Secondary | ICD-10-CM | POA: Diagnosis not present

## 2015-12-03 DIAGNOSIS — Z7982 Long term (current) use of aspirin: Secondary | ICD-10-CM | POA: Diagnosis not present

## 2015-12-03 DIAGNOSIS — Z79899 Other long term (current) drug therapy: Secondary | ICD-10-CM | POA: Diagnosis not present

## 2015-12-03 DIAGNOSIS — S0081XA Abrasion of other part of head, initial encounter: Secondary | ICD-10-CM | POA: Diagnosis not present

## 2015-12-03 DIAGNOSIS — S299XXA Unspecified injury of thorax, initial encounter: Secondary | ICD-10-CM | POA: Diagnosis not present

## 2015-12-03 DIAGNOSIS — Z87440 Personal history of urinary (tract) infections: Secondary | ICD-10-CM | POA: Diagnosis not present

## 2015-12-03 DIAGNOSIS — Z940 Kidney transplant status: Secondary | ICD-10-CM | POA: Diagnosis not present

## 2015-12-03 DIAGNOSIS — E039 Hypothyroidism, unspecified: Secondary | ICD-10-CM | POA: Diagnosis not present

## 2015-12-03 DIAGNOSIS — W1839XA Other fall on same level, initial encounter: Secondary | ICD-10-CM | POA: Diagnosis not present

## 2015-12-03 DIAGNOSIS — I2510 Atherosclerotic heart disease of native coronary artery without angina pectoris: Secondary | ICD-10-CM | POA: Diagnosis not present

## 2015-12-03 DIAGNOSIS — I10 Essential (primary) hypertension: Secondary | ICD-10-CM | POA: Diagnosis not present

## 2015-12-04 DIAGNOSIS — Z452 Encounter for adjustment and management of vascular access device: Secondary | ICD-10-CM | POA: Diagnosis not present

## 2015-12-04 DIAGNOSIS — Z4800 Encounter for change or removal of nonsurgical wound dressing: Secondary | ICD-10-CM | POA: Diagnosis not present

## 2015-12-05 DIAGNOSIS — B9620 Unspecified Escherichia coli [E. coli] as the cause of diseases classified elsewhere: Secondary | ICD-10-CM | POA: Diagnosis not present

## 2015-12-05 DIAGNOSIS — F329 Major depressive disorder, single episode, unspecified: Secondary | ICD-10-CM | POA: Diagnosis present

## 2015-12-05 DIAGNOSIS — I119 Hypertensive heart disease without heart failure: Secondary | ICD-10-CM | POA: Diagnosis not present

## 2015-12-05 DIAGNOSIS — Z87440 Personal history of urinary (tract) infections: Secondary | ICD-10-CM | POA: Diagnosis not present

## 2015-12-05 DIAGNOSIS — Z803 Family history of malignant neoplasm of breast: Secondary | ICD-10-CM | POA: Diagnosis not present

## 2015-12-05 DIAGNOSIS — E1122 Type 2 diabetes mellitus with diabetic chronic kidney disease: Secondary | ICD-10-CM | POA: Diagnosis not present

## 2015-12-05 DIAGNOSIS — I4510 Unspecified right bundle-branch block: Secondary | ICD-10-CM | POA: Diagnosis not present

## 2015-12-05 DIAGNOSIS — R918 Other nonspecific abnormal finding of lung field: Secondary | ICD-10-CM | POA: Diagnosis not present

## 2015-12-05 DIAGNOSIS — I120 Hypertensive chronic kidney disease with stage 5 chronic kidney disease or end stage renal disease: Secondary | ICD-10-CM | POA: Diagnosis not present

## 2015-12-05 DIAGNOSIS — N390 Urinary tract infection, site not specified: Secondary | ICD-10-CM | POA: Diagnosis not present

## 2015-12-05 DIAGNOSIS — S0990XA Unspecified injury of head, initial encounter: Secondary | ICD-10-CM | POA: Diagnosis not present

## 2015-12-05 DIAGNOSIS — Z9181 History of falling: Secondary | ICD-10-CM | POA: Diagnosis not present

## 2015-12-05 DIAGNOSIS — E872 Acidosis: Secondary | ICD-10-CM | POA: Diagnosis not present

## 2015-12-05 DIAGNOSIS — Z794 Long term (current) use of insulin: Secondary | ICD-10-CM | POA: Diagnosis not present

## 2015-12-05 DIAGNOSIS — E869 Volume depletion, unspecified: Secondary | ICD-10-CM | POA: Diagnosis not present

## 2015-12-05 DIAGNOSIS — I25810 Atherosclerosis of coronary artery bypass graft(s) without angina pectoris: Secondary | ICD-10-CM | POA: Diagnosis not present

## 2015-12-05 DIAGNOSIS — I252 Old myocardial infarction: Secondary | ICD-10-CM | POA: Diagnosis not present

## 2015-12-05 DIAGNOSIS — W19XXXA Unspecified fall, initial encounter: Secondary | ICD-10-CM | POA: Diagnosis not present

## 2015-12-05 DIAGNOSIS — N3289 Other specified disorders of bladder: Secondary | ICD-10-CM | POA: Diagnosis present

## 2015-12-05 DIAGNOSIS — N401 Enlarged prostate with lower urinary tract symptoms: Secondary | ICD-10-CM | POA: Diagnosis present

## 2015-12-05 DIAGNOSIS — I2510 Atherosclerotic heart disease of native coronary artery without angina pectoris: Secondary | ICD-10-CM | POA: Diagnosis present

## 2015-12-05 DIAGNOSIS — Z87891 Personal history of nicotine dependence: Secondary | ICD-10-CM | POA: Diagnosis not present

## 2015-12-05 DIAGNOSIS — J90 Pleural effusion, not elsewhere classified: Secondary | ICD-10-CM | POA: Diagnosis not present

## 2015-12-05 DIAGNOSIS — Z7982 Long term (current) use of aspirin: Secondary | ICD-10-CM | POA: Diagnosis not present

## 2015-12-05 DIAGNOSIS — R7881 Bacteremia: Secondary | ICD-10-CM | POA: Diagnosis not present

## 2015-12-05 DIAGNOSIS — E039 Hypothyroidism, unspecified: Secondary | ICD-10-CM | POA: Diagnosis present

## 2015-12-05 DIAGNOSIS — I10 Essential (primary) hypertension: Secondary | ICD-10-CM | POA: Diagnosis not present

## 2015-12-05 DIAGNOSIS — Z79899 Other long term (current) drug therapy: Secondary | ICD-10-CM | POA: Diagnosis not present

## 2015-12-05 DIAGNOSIS — Z5181 Encounter for therapeutic drug level monitoring: Secondary | ICD-10-CM | POA: Diagnosis not present

## 2015-12-05 DIAGNOSIS — Z4822 Encounter for aftercare following kidney transplant: Secondary | ICD-10-CM | POA: Diagnosis not present

## 2015-12-05 DIAGNOSIS — E119 Type 2 diabetes mellitus without complications: Secondary | ICD-10-CM | POA: Diagnosis present

## 2015-12-05 DIAGNOSIS — D649 Anemia, unspecified: Secondary | ICD-10-CM | POA: Diagnosis present

## 2015-12-05 DIAGNOSIS — N186 End stage renal disease: Secondary | ICD-10-CM | POA: Diagnosis not present

## 2015-12-05 DIAGNOSIS — I493 Ventricular premature depolarization: Secondary | ICD-10-CM | POA: Diagnosis not present

## 2015-12-05 DIAGNOSIS — Z955 Presence of coronary angioplasty implant and graft: Secondary | ICD-10-CM | POA: Diagnosis not present

## 2015-12-05 DIAGNOSIS — E785 Hyperlipidemia, unspecified: Secondary | ICD-10-CM | POA: Diagnosis present

## 2015-12-05 DIAGNOSIS — R296 Repeated falls: Secondary | ICD-10-CM | POA: Diagnosis present

## 2015-12-05 DIAGNOSIS — D8989 Other specified disorders involving the immune mechanism, not elsewhere classified: Secondary | ICD-10-CM | POA: Diagnosis not present

## 2015-12-05 DIAGNOSIS — I444 Left anterior fascicular block: Secondary | ICD-10-CM | POA: Diagnosis not present

## 2015-12-05 DIAGNOSIS — I452 Bifascicular block: Secondary | ICD-10-CM | POA: Diagnosis not present

## 2015-12-05 DIAGNOSIS — S0993XA Unspecified injury of face, initial encounter: Secondary | ICD-10-CM | POA: Diagnosis not present

## 2015-12-05 DIAGNOSIS — Z940 Kidney transplant status: Secondary | ICD-10-CM | POA: Diagnosis not present

## 2015-12-05 DIAGNOSIS — I129 Hypertensive chronic kidney disease with stage 1 through stage 4 chronic kidney disease, or unspecified chronic kidney disease: Secondary | ICD-10-CM | POA: Diagnosis not present

## 2015-12-05 DIAGNOSIS — R001 Bradycardia, unspecified: Secondary | ICD-10-CM | POA: Diagnosis not present

## 2015-12-06 DIAGNOSIS — I444 Left anterior fascicular block: Secondary | ICD-10-CM | POA: Diagnosis not present

## 2015-12-06 DIAGNOSIS — W19XXXA Unspecified fall, initial encounter: Secondary | ICD-10-CM | POA: Diagnosis not present

## 2015-12-06 DIAGNOSIS — I452 Bifascicular block: Secondary | ICD-10-CM | POA: Diagnosis not present

## 2015-12-06 DIAGNOSIS — I493 Ventricular premature depolarization: Secondary | ICD-10-CM | POA: Diagnosis not present

## 2015-12-06 DIAGNOSIS — R001 Bradycardia, unspecified: Secondary | ICD-10-CM | POA: Diagnosis not present

## 2015-12-06 DIAGNOSIS — I4510 Unspecified right bundle-branch block: Secondary | ICD-10-CM | POA: Diagnosis not present

## 2015-12-10 DIAGNOSIS — I252 Old myocardial infarction: Secondary | ICD-10-CM | POA: Diagnosis not present

## 2015-12-10 DIAGNOSIS — Z87891 Personal history of nicotine dependence: Secondary | ICD-10-CM | POA: Diagnosis not present

## 2015-12-10 DIAGNOSIS — I10 Essential (primary) hypertension: Secondary | ICD-10-CM | POA: Diagnosis present

## 2015-12-10 DIAGNOSIS — Z4822 Encounter for aftercare following kidney transplant: Secondary | ICD-10-CM | POA: Diagnosis not present

## 2015-12-10 DIAGNOSIS — N400 Enlarged prostate without lower urinary tract symptoms: Secondary | ICD-10-CM | POA: Diagnosis present

## 2015-12-10 DIAGNOSIS — E039 Hypothyroidism, unspecified: Secondary | ICD-10-CM | POA: Diagnosis present

## 2015-12-10 DIAGNOSIS — R509 Fever, unspecified: Secondary | ICD-10-CM | POA: Diagnosis not present

## 2015-12-10 DIAGNOSIS — D899 Disorder involving the immune mechanism, unspecified: Secondary | ICD-10-CM | POA: Diagnosis not present

## 2015-12-10 DIAGNOSIS — I120 Hypertensive chronic kidney disease with stage 5 chronic kidney disease or end stage renal disease: Secondary | ICD-10-CM | POA: Diagnosis not present

## 2015-12-10 DIAGNOSIS — E785 Hyperlipidemia, unspecified: Secondary | ICD-10-CM | POA: Diagnosis not present

## 2015-12-10 DIAGNOSIS — Z87440 Personal history of urinary (tract) infections: Secondary | ICD-10-CM | POA: Diagnosis not present

## 2015-12-10 DIAGNOSIS — Z79899 Other long term (current) drug therapy: Secondary | ICD-10-CM | POA: Diagnosis not present

## 2015-12-10 DIAGNOSIS — Z794 Long term (current) use of insulin: Secondary | ICD-10-CM | POA: Diagnosis not present

## 2015-12-10 DIAGNOSIS — I2510 Atherosclerotic heart disease of native coronary artery without angina pectoris: Secondary | ICD-10-CM | POA: Diagnosis present

## 2015-12-10 DIAGNOSIS — T83518A Infection and inflammatory reaction due to other urinary catheter, initial encounter: Secondary | ICD-10-CM | POA: Diagnosis not present

## 2015-12-10 DIAGNOSIS — Z5181 Encounter for therapeutic drug level monitoring: Secondary | ICD-10-CM | POA: Diagnosis not present

## 2015-12-10 DIAGNOSIS — Z940 Kidney transplant status: Secondary | ICD-10-CM | POA: Diagnosis not present

## 2015-12-10 DIAGNOSIS — Z7982 Long term (current) use of aspirin: Secondary | ICD-10-CM | POA: Diagnosis not present

## 2015-12-10 DIAGNOSIS — I25810 Atherosclerosis of coronary artery bypass graft(s) without angina pectoris: Secondary | ICD-10-CM | POA: Diagnosis not present

## 2015-12-10 DIAGNOSIS — E119 Type 2 diabetes mellitus without complications: Secondary | ICD-10-CM | POA: Diagnosis present

## 2015-12-10 DIAGNOSIS — R5081 Fever presenting with conditions classified elsewhere: Secondary | ICD-10-CM | POA: Diagnosis not present

## 2015-12-10 DIAGNOSIS — N390 Urinary tract infection, site not specified: Secondary | ICD-10-CM | POA: Diagnosis present

## 2015-12-10 DIAGNOSIS — R296 Repeated falls: Secondary | ICD-10-CM | POA: Diagnosis present

## 2015-12-10 DIAGNOSIS — N210 Calculus in bladder: Secondary | ICD-10-CM | POA: Diagnosis not present

## 2015-12-10 DIAGNOSIS — N186 End stage renal disease: Secondary | ICD-10-CM | POA: Diagnosis not present

## 2015-12-10 DIAGNOSIS — B9689 Other specified bacterial agents as the cause of diseases classified elsewhere: Secondary | ICD-10-CM | POA: Diagnosis not present

## 2015-12-15 DIAGNOSIS — Z79899 Other long term (current) drug therapy: Secondary | ICD-10-CM | POA: Diagnosis not present

## 2015-12-15 DIAGNOSIS — Z7982 Long term (current) use of aspirin: Secondary | ICD-10-CM | POA: Diagnosis not present

## 2015-12-15 DIAGNOSIS — I120 Hypertensive chronic kidney disease with stage 5 chronic kidney disease or end stage renal disease: Secondary | ICD-10-CM | POA: Diagnosis not present

## 2015-12-15 DIAGNOSIS — A498 Other bacterial infections of unspecified site: Secondary | ICD-10-CM | POA: Diagnosis not present

## 2015-12-15 DIAGNOSIS — E1122 Type 2 diabetes mellitus with diabetic chronic kidney disease: Secondary | ICD-10-CM | POA: Diagnosis not present

## 2015-12-15 DIAGNOSIS — E785 Hyperlipidemia, unspecified: Secondary | ICD-10-CM | POA: Diagnosis not present

## 2015-12-15 DIAGNOSIS — R7881 Bacteremia: Secondary | ICD-10-CM | POA: Diagnosis not present

## 2015-12-15 DIAGNOSIS — Z940 Kidney transplant status: Secondary | ICD-10-CM | POA: Diagnosis not present

## 2015-12-15 DIAGNOSIS — N179 Acute kidney failure, unspecified: Secondary | ICD-10-CM | POA: Diagnosis not present

## 2015-12-15 DIAGNOSIS — N320 Bladder-neck obstruction: Secondary | ICD-10-CM | POA: Diagnosis not present

## 2015-12-15 DIAGNOSIS — Z5181 Encounter for therapeutic drug level monitoring: Secondary | ICD-10-CM | POA: Diagnosis not present

## 2015-12-15 DIAGNOSIS — Z794 Long term (current) use of insulin: Secondary | ICD-10-CM | POA: Diagnosis not present

## 2015-12-15 DIAGNOSIS — N12 Tubulo-interstitial nephritis, not specified as acute or chronic: Secondary | ICD-10-CM | POA: Diagnosis not present

## 2015-12-15 DIAGNOSIS — Z4822 Encounter for aftercare following kidney transplant: Secondary | ICD-10-CM | POA: Diagnosis not present

## 2015-12-15 DIAGNOSIS — D899 Disorder involving the immune mechanism, unspecified: Secondary | ICD-10-CM | POA: Diagnosis not present

## 2015-12-15 DIAGNOSIS — I2510 Atherosclerotic heart disease of native coronary artery without angina pectoris: Secondary | ICD-10-CM | POA: Diagnosis not present

## 2015-12-15 DIAGNOSIS — Z951 Presence of aortocoronary bypass graft: Secondary | ICD-10-CM | POA: Diagnosis not present

## 2015-12-15 DIAGNOSIS — N186 End stage renal disease: Secondary | ICD-10-CM | POA: Diagnosis not present

## 2015-12-16 ENCOUNTER — Encounter (HOSPITAL_COMMUNITY): Payer: Medicare Other

## 2015-12-23 DIAGNOSIS — N186 End stage renal disease: Secondary | ICD-10-CM | POA: Diagnosis not present

## 2015-12-23 DIAGNOSIS — E039 Hypothyroidism, unspecified: Secondary | ICD-10-CM | POA: Diagnosis not present

## 2015-12-23 DIAGNOSIS — Z951 Presence of aortocoronary bypass graft: Secondary | ICD-10-CM | POA: Diagnosis not present

## 2015-12-23 DIAGNOSIS — E1122 Type 2 diabetes mellitus with diabetic chronic kidney disease: Secondary | ICD-10-CM | POA: Diagnosis not present

## 2015-12-23 DIAGNOSIS — N390 Urinary tract infection, site not specified: Secondary | ICD-10-CM | POA: Diagnosis not present

## 2015-12-23 DIAGNOSIS — I2510 Atherosclerotic heart disease of native coronary artery without angina pectoris: Secondary | ICD-10-CM | POA: Diagnosis not present

## 2015-12-23 DIAGNOSIS — E119 Type 2 diabetes mellitus without complications: Secondary | ICD-10-CM | POA: Diagnosis not present

## 2015-12-23 DIAGNOSIS — R339 Retention of urine, unspecified: Secondary | ICD-10-CM | POA: Diagnosis not present

## 2015-12-23 DIAGNOSIS — Z79899 Other long term (current) drug therapy: Secondary | ICD-10-CM | POA: Diagnosis not present

## 2015-12-23 DIAGNOSIS — E785 Hyperlipidemia, unspecified: Secondary | ICD-10-CM | POA: Diagnosis not present

## 2015-12-23 DIAGNOSIS — Z7982 Long term (current) use of aspirin: Secondary | ICD-10-CM | POA: Diagnosis not present

## 2015-12-23 DIAGNOSIS — R338 Other retention of urine: Secondary | ICD-10-CM | POA: Diagnosis not present

## 2015-12-23 DIAGNOSIS — N401 Enlarged prostate with lower urinary tract symptoms: Secondary | ICD-10-CM | POA: Diagnosis not present

## 2015-12-23 DIAGNOSIS — N138 Other obstructive and reflux uropathy: Secondary | ICD-10-CM | POA: Diagnosis not present

## 2015-12-23 DIAGNOSIS — Z87891 Personal history of nicotine dependence: Secondary | ICD-10-CM | POA: Diagnosis not present

## 2015-12-23 DIAGNOSIS — Z885 Allergy status to narcotic agent status: Secondary | ICD-10-CM | POA: Diagnosis not present

## 2015-12-23 DIAGNOSIS — I120 Hypertensive chronic kidney disease with stage 5 chronic kidney disease or end stage renal disease: Secondary | ICD-10-CM | POA: Diagnosis not present

## 2015-12-23 DIAGNOSIS — Z9289 Personal history of other medical treatment: Secondary | ICD-10-CM | POA: Diagnosis not present

## 2015-12-23 DIAGNOSIS — Z87440 Personal history of urinary (tract) infections: Secondary | ICD-10-CM | POA: Diagnosis not present

## 2015-12-23 DIAGNOSIS — Z940 Kidney transplant status: Secondary | ICD-10-CM | POA: Diagnosis not present

## 2015-12-23 DIAGNOSIS — Z882 Allergy status to sulfonamides status: Secondary | ICD-10-CM | POA: Diagnosis not present

## 2015-12-23 DIAGNOSIS — D8989 Other specified disorders involving the immune mechanism, not elsewhere classified: Secondary | ICD-10-CM | POA: Diagnosis not present

## 2015-12-23 DIAGNOSIS — Z794 Long term (current) use of insulin: Secondary | ICD-10-CM | POA: Diagnosis not present

## 2015-12-27 DIAGNOSIS — R112 Nausea with vomiting, unspecified: Secondary | ICD-10-CM | POA: Diagnosis not present

## 2015-12-27 DIAGNOSIS — N390 Urinary tract infection, site not specified: Secondary | ICD-10-CM | POA: Diagnosis not present

## 2015-12-27 DIAGNOSIS — R509 Fever, unspecified: Secondary | ICD-10-CM | POA: Diagnosis not present

## 2015-12-27 DIAGNOSIS — T8613 Kidney transplant infection: Secondary | ICD-10-CM | POA: Diagnosis not present

## 2015-12-27 DIAGNOSIS — E785 Hyperlipidemia, unspecified: Secondary | ICD-10-CM | POA: Diagnosis not present

## 2015-12-27 DIAGNOSIS — E119 Type 2 diabetes mellitus without complications: Secondary | ICD-10-CM | POA: Diagnosis not present

## 2015-12-27 DIAGNOSIS — Z940 Kidney transplant status: Secondary | ICD-10-CM | POA: Diagnosis not present

## 2015-12-27 DIAGNOSIS — E039 Hypothyroidism, unspecified: Secondary | ICD-10-CM | POA: Diagnosis not present

## 2015-12-28 DIAGNOSIS — R339 Retention of urine, unspecified: Secondary | ICD-10-CM | POA: Diagnosis not present

## 2015-12-28 DIAGNOSIS — R509 Fever, unspecified: Secondary | ICD-10-CM | POA: Diagnosis not present

## 2015-12-28 DIAGNOSIS — E119 Type 2 diabetes mellitus without complications: Secondary | ICD-10-CM | POA: Diagnosis present

## 2015-12-28 DIAGNOSIS — I2510 Atherosclerotic heart disease of native coronary artery without angina pectoris: Secondary | ICD-10-CM | POA: Diagnosis present

## 2015-12-28 DIAGNOSIS — E039 Hypothyroidism, unspecified: Secondary | ICD-10-CM | POA: Diagnosis present

## 2015-12-28 DIAGNOSIS — Z79899 Other long term (current) drug therapy: Secondary | ICD-10-CM | POA: Diagnosis not present

## 2015-12-28 DIAGNOSIS — Z951 Presence of aortocoronary bypass graft: Secondary | ICD-10-CM | POA: Diagnosis not present

## 2015-12-28 DIAGNOSIS — I10 Essential (primary) hypertension: Secondary | ICD-10-CM | POA: Diagnosis present

## 2015-12-28 DIAGNOSIS — Z955 Presence of coronary angioplasty implant and graft: Secondary | ICD-10-CM | POA: Diagnosis not present

## 2015-12-28 DIAGNOSIS — Z5181 Encounter for therapeutic drug level monitoring: Secondary | ICD-10-CM | POA: Diagnosis not present

## 2015-12-28 DIAGNOSIS — Z803 Family history of malignant neoplasm of breast: Secondary | ICD-10-CM | POA: Diagnosis not present

## 2015-12-28 DIAGNOSIS — T8613 Kidney transplant infection: Secondary | ICD-10-CM | POA: Diagnosis present

## 2015-12-28 DIAGNOSIS — E860 Dehydration: Secondary | ICD-10-CM | POA: Diagnosis not present

## 2015-12-28 DIAGNOSIS — E785 Hyperlipidemia, unspecified: Secondary | ICD-10-CM | POA: Diagnosis present

## 2015-12-28 DIAGNOSIS — R112 Nausea with vomiting, unspecified: Secondary | ICD-10-CM | POA: Diagnosis not present

## 2015-12-28 DIAGNOSIS — I25810 Atherosclerosis of coronary artery bypass graft(s) without angina pectoris: Secondary | ICD-10-CM | POA: Diagnosis not present

## 2015-12-28 DIAGNOSIS — N390 Urinary tract infection, site not specified: Secondary | ICD-10-CM | POA: Diagnosis present

## 2015-12-28 DIAGNOSIS — R296 Repeated falls: Secondary | ICD-10-CM | POA: Diagnosis present

## 2015-12-28 DIAGNOSIS — Z87891 Personal history of nicotine dependence: Secondary | ICD-10-CM | POA: Diagnosis not present

## 2015-12-28 DIAGNOSIS — Z7982 Long term (current) use of aspirin: Secondary | ICD-10-CM | POA: Diagnosis not present

## 2015-12-28 DIAGNOSIS — N400 Enlarged prostate without lower urinary tract symptoms: Secondary | ICD-10-CM | POA: Diagnosis present

## 2015-12-28 DIAGNOSIS — Z940 Kidney transplant status: Secondary | ICD-10-CM | POA: Diagnosis not present

## 2015-12-28 DIAGNOSIS — Z794 Long term (current) use of insulin: Secondary | ICD-10-CM | POA: Diagnosis not present

## 2015-12-28 DIAGNOSIS — Z4822 Encounter for aftercare following kidney transplant: Secondary | ICD-10-CM | POA: Diagnosis not present

## 2015-12-28 DIAGNOSIS — R633 Feeding difficulties: Secondary | ICD-10-CM | POA: Diagnosis not present

## 2015-12-31 DIAGNOSIS — Z5181 Encounter for therapeutic drug level monitoring: Secondary | ICD-10-CM | POA: Diagnosis not present

## 2015-12-31 DIAGNOSIS — I129 Hypertensive chronic kidney disease with stage 1 through stage 4 chronic kidney disease, or unspecified chronic kidney disease: Secondary | ICD-10-CM | POA: Diagnosis not present

## 2015-12-31 DIAGNOSIS — E119 Type 2 diabetes mellitus without complications: Secondary | ICD-10-CM | POA: Diagnosis not present

## 2015-12-31 DIAGNOSIS — N390 Urinary tract infection, site not specified: Secondary | ICD-10-CM | POA: Diagnosis present

## 2015-12-31 DIAGNOSIS — T8613 Kidney transplant infection: Secondary | ICD-10-CM | POA: Diagnosis not present

## 2015-12-31 DIAGNOSIS — J90 Pleural effusion, not elsewhere classified: Secondary | ICD-10-CM | POA: Diagnosis not present

## 2015-12-31 DIAGNOSIS — Z79899 Other long term (current) drug therapy: Secondary | ICD-10-CM | POA: Diagnosis not present

## 2015-12-31 DIAGNOSIS — N401 Enlarged prostate with lower urinary tract symptoms: Secondary | ICD-10-CM | POA: Diagnosis not present

## 2015-12-31 DIAGNOSIS — R410 Disorientation, unspecified: Secondary | ICD-10-CM | POA: Diagnosis not present

## 2015-12-31 DIAGNOSIS — D638 Anemia in other chronic diseases classified elsewhere: Secondary | ICD-10-CM | POA: Diagnosis not present

## 2015-12-31 DIAGNOSIS — Z940 Kidney transplant status: Secondary | ICD-10-CM | POA: Diagnosis not present

## 2015-12-31 DIAGNOSIS — R319 Hematuria, unspecified: Secondary | ICD-10-CM | POA: Diagnosis not present

## 2015-12-31 DIAGNOSIS — R918 Other nonspecific abnormal finding of lung field: Secondary | ICD-10-CM | POA: Diagnosis not present

## 2015-12-31 DIAGNOSIS — Z4822 Encounter for aftercare following kidney transplant: Secondary | ICD-10-CM | POA: Diagnosis not present

## 2015-12-31 DIAGNOSIS — N183 Chronic kidney disease, stage 3 (moderate): Secondary | ICD-10-CM | POA: Diagnosis not present

## 2015-12-31 DIAGNOSIS — N138 Other obstructive and reflux uropathy: Secondary | ICD-10-CM | POA: Diagnosis not present

## 2015-12-31 DIAGNOSIS — Z87440 Personal history of urinary (tract) infections: Secondary | ICD-10-CM | POA: Diagnosis not present

## 2015-12-31 DIAGNOSIS — Z955 Presence of coronary angioplasty implant and graft: Secondary | ICD-10-CM | POA: Diagnosis not present

## 2015-12-31 DIAGNOSIS — I25810 Atherosclerosis of coronary artery bypass graft(s) without angina pectoris: Secondary | ICD-10-CM | POA: Diagnosis not present

## 2015-12-31 DIAGNOSIS — Z951 Presence of aortocoronary bypass graft: Secondary | ICD-10-CM | POA: Diagnosis not present

## 2015-12-31 DIAGNOSIS — R7881 Bacteremia: Secondary | ICD-10-CM | POA: Diagnosis not present

## 2015-12-31 DIAGNOSIS — Z960 Presence of urogenital implants: Secondary | ICD-10-CM | POA: Diagnosis not present

## 2015-12-31 DIAGNOSIS — Z87891 Personal history of nicotine dependence: Secondary | ICD-10-CM | POA: Diagnosis not present

## 2015-12-31 DIAGNOSIS — E1122 Type 2 diabetes mellitus with diabetic chronic kidney disease: Secondary | ICD-10-CM | POA: Diagnosis not present

## 2015-12-31 DIAGNOSIS — E039 Hypothyroidism, unspecified: Secondary | ICD-10-CM | POA: Diagnosis present

## 2015-12-31 DIAGNOSIS — Z794 Long term (current) use of insulin: Secondary | ICD-10-CM | POA: Diagnosis not present

## 2015-12-31 DIAGNOSIS — R509 Fever, unspecified: Secondary | ICD-10-CM | POA: Diagnosis not present

## 2015-12-31 DIAGNOSIS — I2510 Atherosclerotic heart disease of native coronary artery without angina pectoris: Secondary | ICD-10-CM | POA: Diagnosis present

## 2015-12-31 DIAGNOSIS — B9620 Unspecified Escherichia coli [E. coli] as the cause of diseases classified elsewhere: Secondary | ICD-10-CM | POA: Diagnosis present

## 2015-12-31 DIAGNOSIS — Z9861 Coronary angioplasty status: Secondary | ICD-10-CM | POA: Diagnosis not present

## 2016-01-05 DIAGNOSIS — Z79899 Other long term (current) drug therapy: Secondary | ICD-10-CM | POA: Diagnosis not present

## 2016-01-05 DIAGNOSIS — Z940 Kidney transplant status: Secondary | ICD-10-CM | POA: Diagnosis not present

## 2016-01-05 DIAGNOSIS — E119 Type 2 diabetes mellitus without complications: Secondary | ICD-10-CM | POA: Diagnosis not present

## 2016-01-05 DIAGNOSIS — Z794 Long term (current) use of insulin: Secondary | ICD-10-CM | POA: Diagnosis not present

## 2016-01-05 DIAGNOSIS — Z792 Long term (current) use of antibiotics: Secondary | ICD-10-CM | POA: Diagnosis not present

## 2016-01-05 DIAGNOSIS — D649 Anemia, unspecified: Secondary | ICD-10-CM | POA: Diagnosis not present

## 2016-01-05 DIAGNOSIS — N390 Urinary tract infection, site not specified: Secondary | ICD-10-CM | POA: Diagnosis not present

## 2016-01-05 DIAGNOSIS — D8989 Other specified disorders involving the immune mechanism, not elsewhere classified: Secondary | ICD-10-CM | POA: Diagnosis not present

## 2016-01-05 DIAGNOSIS — I10 Essential (primary) hypertension: Secondary | ICD-10-CM | POA: Diagnosis not present

## 2016-01-07 DIAGNOSIS — D899 Disorder involving the immune mechanism, unspecified: Secondary | ICD-10-CM | POA: Diagnosis not present

## 2016-01-07 DIAGNOSIS — Z885 Allergy status to narcotic agent status: Secondary | ICD-10-CM | POA: Diagnosis not present

## 2016-01-07 DIAGNOSIS — I10 Essential (primary) hypertension: Secondary | ICD-10-CM | POA: Diagnosis not present

## 2016-01-07 DIAGNOSIS — E039 Hypothyroidism, unspecified: Secondary | ICD-10-CM | POA: Diagnosis not present

## 2016-01-07 DIAGNOSIS — R339 Retention of urine, unspecified: Secondary | ICD-10-CM | POA: Diagnosis not present

## 2016-01-07 DIAGNOSIS — Z7952 Long term (current) use of systemic steroids: Secondary | ICD-10-CM | POA: Diagnosis not present

## 2016-01-07 DIAGNOSIS — I120 Hypertensive chronic kidney disease with stage 5 chronic kidney disease or end stage renal disease: Secondary | ICD-10-CM | POA: Diagnosis not present

## 2016-01-07 DIAGNOSIS — N390 Urinary tract infection, site not specified: Secondary | ICD-10-CM | POA: Diagnosis not present

## 2016-01-07 DIAGNOSIS — Z882 Allergy status to sulfonamides status: Secondary | ICD-10-CM | POA: Diagnosis not present

## 2016-01-07 DIAGNOSIS — D8989 Other specified disorders involving the immune mechanism, not elsewhere classified: Secondary | ICD-10-CM | POA: Diagnosis not present

## 2016-01-07 DIAGNOSIS — E1122 Type 2 diabetes mellitus with diabetic chronic kidney disease: Secondary | ICD-10-CM | POA: Diagnosis not present

## 2016-01-07 DIAGNOSIS — Z7982 Long term (current) use of aspirin: Secondary | ICD-10-CM | POA: Diagnosis not present

## 2016-01-07 DIAGNOSIS — E785 Hyperlipidemia, unspecified: Secondary | ICD-10-CM | POA: Diagnosis not present

## 2016-01-07 DIAGNOSIS — N400 Enlarged prostate without lower urinary tract symptoms: Secondary | ICD-10-CM | POA: Diagnosis not present

## 2016-01-07 DIAGNOSIS — D649 Anemia, unspecified: Secondary | ICD-10-CM | POA: Diagnosis not present

## 2016-01-07 DIAGNOSIS — N186 End stage renal disease: Secondary | ICD-10-CM | POA: Diagnosis not present

## 2016-01-07 DIAGNOSIS — Z794 Long term (current) use of insulin: Secondary | ICD-10-CM | POA: Diagnosis not present

## 2016-01-07 DIAGNOSIS — Z951 Presence of aortocoronary bypass graft: Secondary | ICD-10-CM | POA: Diagnosis not present

## 2016-01-07 DIAGNOSIS — Z79899 Other long term (current) drug therapy: Secondary | ICD-10-CM | POA: Diagnosis not present

## 2016-01-07 DIAGNOSIS — E119 Type 2 diabetes mellitus without complications: Secondary | ICD-10-CM | POA: Diagnosis not present

## 2016-01-07 DIAGNOSIS — N320 Bladder-neck obstruction: Secondary | ICD-10-CM | POA: Diagnosis not present

## 2016-01-07 DIAGNOSIS — R296 Repeated falls: Secondary | ICD-10-CM | POA: Diagnosis not present

## 2016-01-07 DIAGNOSIS — Z4822 Encounter for aftercare following kidney transplant: Secondary | ICD-10-CM | POA: Diagnosis not present

## 2016-01-07 DIAGNOSIS — Z792 Long term (current) use of antibiotics: Secondary | ICD-10-CM | POA: Diagnosis not present

## 2016-01-07 DIAGNOSIS — I2510 Atherosclerotic heart disease of native coronary artery without angina pectoris: Secondary | ICD-10-CM | POA: Diagnosis not present

## 2016-01-07 DIAGNOSIS — Z940 Kidney transplant status: Secondary | ICD-10-CM | POA: Diagnosis not present

## 2016-01-12 ENCOUNTER — Ambulatory Visit: Payer: Medicare Other | Admitting: Cardiovascular Disease

## 2016-01-14 DIAGNOSIS — E785 Hyperlipidemia, unspecified: Secondary | ICD-10-CM | POA: Diagnosis not present

## 2016-01-14 DIAGNOSIS — N138 Other obstructive and reflux uropathy: Secondary | ICD-10-CM | POA: Diagnosis not present

## 2016-01-14 DIAGNOSIS — D8989 Other specified disorders involving the immune mechanism, not elsewhere classified: Secondary | ICD-10-CM | POA: Diagnosis not present

## 2016-01-14 DIAGNOSIS — E119 Type 2 diabetes mellitus without complications: Secondary | ICD-10-CM | POA: Diagnosis not present

## 2016-01-14 DIAGNOSIS — Z9181 History of falling: Secondary | ICD-10-CM | POA: Diagnosis not present

## 2016-01-14 DIAGNOSIS — Z794 Long term (current) use of insulin: Secondary | ICD-10-CM | POA: Diagnosis not present

## 2016-01-14 DIAGNOSIS — I10 Essential (primary) hypertension: Secondary | ICD-10-CM | POA: Diagnosis not present

## 2016-01-14 DIAGNOSIS — I120 Hypertensive chronic kidney disease with stage 5 chronic kidney disease or end stage renal disease: Secondary | ICD-10-CM | POA: Diagnosis not present

## 2016-01-14 DIAGNOSIS — N401 Enlarged prostate with lower urinary tract symptoms: Secondary | ICD-10-CM | POA: Diagnosis not present

## 2016-01-14 DIAGNOSIS — Z4822 Encounter for aftercare following kidney transplant: Secondary | ICD-10-CM | POA: Diagnosis not present

## 2016-01-14 DIAGNOSIS — R339 Retention of urine, unspecified: Secondary | ICD-10-CM | POA: Diagnosis not present

## 2016-01-14 DIAGNOSIS — Z79899 Other long term (current) drug therapy: Secondary | ICD-10-CM | POA: Diagnosis not present

## 2016-01-14 DIAGNOSIS — E1022 Type 1 diabetes mellitus with diabetic chronic kidney disease: Secondary | ICD-10-CM | POA: Diagnosis not present

## 2016-01-14 DIAGNOSIS — N419 Inflammatory disease of prostate, unspecified: Secondary | ICD-10-CM | POA: Diagnosis not present

## 2016-01-14 DIAGNOSIS — N186 End stage renal disease: Secondary | ICD-10-CM | POA: Diagnosis not present

## 2016-01-14 DIAGNOSIS — E039 Hypothyroidism, unspecified: Secondary | ICD-10-CM | POA: Diagnosis not present

## 2016-01-14 DIAGNOSIS — E871 Hypo-osmolality and hyponatremia: Secondary | ICD-10-CM | POA: Diagnosis not present

## 2016-01-14 DIAGNOSIS — Z940 Kidney transplant status: Secondary | ICD-10-CM | POA: Diagnosis not present

## 2016-01-23 DIAGNOSIS — N320 Bladder-neck obstruction: Secondary | ICD-10-CM | POA: Diagnosis not present

## 2016-01-23 DIAGNOSIS — E119 Type 2 diabetes mellitus without complications: Secondary | ICD-10-CM | POA: Diagnosis not present

## 2016-01-23 DIAGNOSIS — Z79899 Other long term (current) drug therapy: Secondary | ICD-10-CM | POA: Diagnosis not present

## 2016-01-23 DIAGNOSIS — Z23 Encounter for immunization: Secondary | ICD-10-CM | POA: Diagnosis not present

## 2016-01-23 DIAGNOSIS — D8989 Other specified disorders involving the immune mechanism, not elsewhere classified: Secondary | ICD-10-CM | POA: Diagnosis not present

## 2016-01-23 DIAGNOSIS — Z4822 Encounter for aftercare following kidney transplant: Secondary | ICD-10-CM | POA: Diagnosis not present

## 2016-01-23 DIAGNOSIS — Z955 Presence of coronary angioplasty implant and graft: Secondary | ICD-10-CM | POA: Diagnosis not present

## 2016-01-23 DIAGNOSIS — Z951 Presence of aortocoronary bypass graft: Secondary | ICD-10-CM | POA: Diagnosis not present

## 2016-01-23 DIAGNOSIS — Z87440 Personal history of urinary (tract) infections: Secondary | ICD-10-CM | POA: Diagnosis not present

## 2016-01-23 DIAGNOSIS — E1122 Type 2 diabetes mellitus with diabetic chronic kidney disease: Secondary | ICD-10-CM | POA: Diagnosis not present

## 2016-01-23 DIAGNOSIS — Z940 Kidney transplant status: Secondary | ICD-10-CM | POA: Diagnosis not present

## 2016-01-23 DIAGNOSIS — N12 Tubulo-interstitial nephritis, not specified as acute or chronic: Secondary | ICD-10-CM | POA: Diagnosis not present

## 2016-01-23 DIAGNOSIS — I120 Hypertensive chronic kidney disease with stage 5 chronic kidney disease or end stage renal disease: Secondary | ICD-10-CM | POA: Diagnosis not present

## 2016-01-23 DIAGNOSIS — N179 Acute kidney failure, unspecified: Secondary | ICD-10-CM | POA: Diagnosis not present

## 2016-01-23 DIAGNOSIS — I2510 Atherosclerotic heart disease of native coronary artery without angina pectoris: Secondary | ICD-10-CM | POA: Diagnosis not present

## 2016-01-23 DIAGNOSIS — R7881 Bacteremia: Secondary | ICD-10-CM | POA: Diagnosis not present

## 2016-01-23 DIAGNOSIS — E785 Hyperlipidemia, unspecified: Secondary | ICD-10-CM | POA: Diagnosis not present

## 2016-01-23 DIAGNOSIS — D631 Anemia in chronic kidney disease: Secondary | ICD-10-CM | POA: Diagnosis not present

## 2016-01-23 DIAGNOSIS — I10 Essential (primary) hypertension: Secondary | ICD-10-CM | POA: Diagnosis not present

## 2016-01-23 DIAGNOSIS — Z794 Long term (current) use of insulin: Secondary | ICD-10-CM | POA: Diagnosis not present

## 2016-01-23 DIAGNOSIS — R339 Retention of urine, unspecified: Secondary | ICD-10-CM | POA: Diagnosis not present

## 2016-01-23 DIAGNOSIS — N186 End stage renal disease: Secondary | ICD-10-CM | POA: Diagnosis not present

## 2016-02-03 DIAGNOSIS — N189 Chronic kidney disease, unspecified: Secondary | ICD-10-CM | POA: Diagnosis not present

## 2016-02-03 DIAGNOSIS — E039 Hypothyroidism, unspecified: Secondary | ICD-10-CM | POA: Diagnosis not present

## 2016-02-03 DIAGNOSIS — I10 Essential (primary) hypertension: Secondary | ICD-10-CM | POA: Diagnosis not present

## 2016-02-03 DIAGNOSIS — K219 Gastro-esophageal reflux disease without esophagitis: Secondary | ICD-10-CM | POA: Diagnosis not present

## 2016-02-03 DIAGNOSIS — E875 Hyperkalemia: Secondary | ICD-10-CM | POA: Diagnosis not present

## 2016-02-03 DIAGNOSIS — E7800 Pure hypercholesterolemia, unspecified: Secondary | ICD-10-CM | POA: Diagnosis not present

## 2016-02-03 DIAGNOSIS — E1165 Type 2 diabetes mellitus with hyperglycemia: Secondary | ICD-10-CM | POA: Diagnosis not present

## 2016-02-03 DIAGNOSIS — D519 Vitamin B12 deficiency anemia, unspecified: Secondary | ICD-10-CM | POA: Diagnosis not present

## 2016-02-05 DIAGNOSIS — N401 Enlarged prostate with lower urinary tract symptoms: Secondary | ICD-10-CM | POA: Diagnosis not present

## 2016-02-05 DIAGNOSIS — E039 Hypothyroidism, unspecified: Secondary | ICD-10-CM | POA: Diagnosis not present

## 2016-02-05 DIAGNOSIS — Z6822 Body mass index (BMI) 22.0-22.9, adult: Secondary | ICD-10-CM | POA: Diagnosis not present

## 2016-02-05 DIAGNOSIS — E1165 Type 2 diabetes mellitus with hyperglycemia: Secondary | ICD-10-CM | POA: Diagnosis not present

## 2016-02-06 DIAGNOSIS — Z940 Kidney transplant status: Secondary | ICD-10-CM | POA: Diagnosis not present

## 2016-02-06 DIAGNOSIS — N401 Enlarged prostate with lower urinary tract symptoms: Secondary | ICD-10-CM | POA: Diagnosis not present

## 2016-02-06 DIAGNOSIS — I10 Essential (primary) hypertension: Secondary | ICD-10-CM | POA: Diagnosis not present

## 2016-02-06 DIAGNOSIS — N400 Enlarged prostate without lower urinary tract symptoms: Secondary | ICD-10-CM | POA: Diagnosis not present

## 2016-02-06 DIAGNOSIS — D649 Anemia, unspecified: Secondary | ICD-10-CM | POA: Diagnosis not present

## 2016-02-06 DIAGNOSIS — Z9889 Other specified postprocedural states: Secondary | ICD-10-CM | POA: Diagnosis not present

## 2016-02-06 DIAGNOSIS — Z7982 Long term (current) use of aspirin: Secondary | ICD-10-CM | POA: Diagnosis not present

## 2016-02-06 DIAGNOSIS — R339 Retention of urine, unspecified: Secondary | ICD-10-CM | POA: Diagnosis not present

## 2016-02-06 DIAGNOSIS — N186 End stage renal disease: Secondary | ICD-10-CM | POA: Diagnosis not present

## 2016-02-06 DIAGNOSIS — Z87440 Personal history of urinary (tract) infections: Secondary | ICD-10-CM | POA: Diagnosis not present

## 2016-02-06 DIAGNOSIS — R8299 Other abnormal findings in urine: Secondary | ICD-10-CM | POA: Diagnosis not present

## 2016-02-06 DIAGNOSIS — Z955 Presence of coronary angioplasty implant and graft: Secondary | ICD-10-CM | POA: Diagnosis not present

## 2016-02-06 DIAGNOSIS — Z79899 Other long term (current) drug therapy: Secondary | ICD-10-CM | POA: Diagnosis not present

## 2016-02-06 DIAGNOSIS — Z951 Presence of aortocoronary bypass graft: Secondary | ICD-10-CM | POA: Diagnosis not present

## 2016-02-06 DIAGNOSIS — D8989 Other specified disorders involving the immune mechanism, not elsewhere classified: Secondary | ICD-10-CM | POA: Diagnosis not present

## 2016-02-06 DIAGNOSIS — Z794 Long term (current) use of insulin: Secondary | ICD-10-CM | POA: Diagnosis not present

## 2016-02-06 DIAGNOSIS — E119 Type 2 diabetes mellitus without complications: Secondary | ICD-10-CM | POA: Diagnosis not present

## 2016-02-06 DIAGNOSIS — I120 Hypertensive chronic kidney disease with stage 5 chronic kidney disease or end stage renal disease: Secondary | ICD-10-CM | POA: Diagnosis not present

## 2016-02-06 DIAGNOSIS — Z4822 Encounter for aftercare following kidney transplant: Secondary | ICD-10-CM | POA: Diagnosis not present

## 2016-02-06 DIAGNOSIS — E785 Hyperlipidemia, unspecified: Secondary | ICD-10-CM | POA: Diagnosis not present

## 2016-02-06 DIAGNOSIS — E1122 Type 2 diabetes mellitus with diabetic chronic kidney disease: Secondary | ICD-10-CM | POA: Diagnosis not present

## 2016-02-06 DIAGNOSIS — I2510 Atherosclerotic heart disease of native coronary artery without angina pectoris: Secondary | ICD-10-CM | POA: Diagnosis not present

## 2016-02-13 DIAGNOSIS — D8989 Other specified disorders involving the immune mechanism, not elsewhere classified: Secondary | ICD-10-CM | POA: Diagnosis not present

## 2016-02-13 DIAGNOSIS — K5900 Constipation, unspecified: Secondary | ICD-10-CM | POA: Diagnosis not present

## 2016-02-13 DIAGNOSIS — E039 Hypothyroidism, unspecified: Secondary | ICD-10-CM | POA: Diagnosis not present

## 2016-02-13 DIAGNOSIS — Z951 Presence of aortocoronary bypass graft: Secondary | ICD-10-CM | POA: Diagnosis not present

## 2016-02-13 DIAGNOSIS — N186 End stage renal disease: Secondary | ICD-10-CM | POA: Diagnosis not present

## 2016-02-13 DIAGNOSIS — D631 Anemia in chronic kidney disease: Secondary | ICD-10-CM | POA: Diagnosis not present

## 2016-02-13 DIAGNOSIS — N401 Enlarged prostate with lower urinary tract symptoms: Secondary | ICD-10-CM | POA: Diagnosis not present

## 2016-02-13 DIAGNOSIS — I2510 Atherosclerotic heart disease of native coronary artery without angina pectoris: Secondary | ICD-10-CM | POA: Diagnosis not present

## 2016-02-13 DIAGNOSIS — Z794 Long term (current) use of insulin: Secondary | ICD-10-CM | POA: Diagnosis not present

## 2016-02-13 DIAGNOSIS — Z885 Allergy status to narcotic agent status: Secondary | ICD-10-CM | POA: Diagnosis not present

## 2016-02-13 DIAGNOSIS — Z882 Allergy status to sulfonamides status: Secondary | ICD-10-CM | POA: Diagnosis not present

## 2016-02-13 DIAGNOSIS — E1122 Type 2 diabetes mellitus with diabetic chronic kidney disease: Secondary | ICD-10-CM | POA: Diagnosis not present

## 2016-02-13 DIAGNOSIS — E871 Hypo-osmolality and hyponatremia: Secondary | ICD-10-CM | POA: Diagnosis not present

## 2016-02-13 DIAGNOSIS — Z7952 Long term (current) use of systemic steroids: Secondary | ICD-10-CM | POA: Diagnosis not present

## 2016-02-13 DIAGNOSIS — Z79899 Other long term (current) drug therapy: Secondary | ICD-10-CM | POA: Diagnosis not present

## 2016-02-13 DIAGNOSIS — D899 Disorder involving the immune mechanism, unspecified: Secondary | ICD-10-CM | POA: Diagnosis not present

## 2016-02-13 DIAGNOSIS — N390 Urinary tract infection, site not specified: Secondary | ICD-10-CM | POA: Diagnosis not present

## 2016-02-13 DIAGNOSIS — I10 Essential (primary) hypertension: Secondary | ICD-10-CM | POA: Diagnosis not present

## 2016-02-13 DIAGNOSIS — Z7982 Long term (current) use of aspirin: Secondary | ICD-10-CM | POA: Diagnosis not present

## 2016-02-13 DIAGNOSIS — Z940 Kidney transplant status: Secondary | ICD-10-CM | POA: Diagnosis not present

## 2016-02-13 DIAGNOSIS — Z4822 Encounter for aftercare following kidney transplant: Secondary | ICD-10-CM | POA: Diagnosis not present

## 2016-02-13 DIAGNOSIS — R338 Other retention of urine: Secondary | ICD-10-CM | POA: Diagnosis not present

## 2016-02-13 DIAGNOSIS — Z9861 Coronary angioplasty status: Secondary | ICD-10-CM | POA: Diagnosis not present

## 2016-02-13 DIAGNOSIS — E785 Hyperlipidemia, unspecified: Secondary | ICD-10-CM | POA: Diagnosis not present

## 2016-02-13 DIAGNOSIS — D649 Anemia, unspecified: Secondary | ICD-10-CM | POA: Diagnosis not present

## 2016-02-13 DIAGNOSIS — I120 Hypertensive chronic kidney disease with stage 5 chronic kidney disease or end stage renal disease: Secondary | ICD-10-CM | POA: Diagnosis not present

## 2016-02-13 DIAGNOSIS — Z9181 History of falling: Secondary | ICD-10-CM | POA: Diagnosis not present

## 2016-02-13 DIAGNOSIS — E119 Type 2 diabetes mellitus without complications: Secondary | ICD-10-CM | POA: Diagnosis not present

## 2016-02-20 DIAGNOSIS — Z7982 Long term (current) use of aspirin: Secondary | ICD-10-CM | POA: Diagnosis not present

## 2016-02-20 DIAGNOSIS — Z794 Long term (current) use of insulin: Secondary | ICD-10-CM | POA: Diagnosis not present

## 2016-02-20 DIAGNOSIS — Z9889 Other specified postprocedural states: Secondary | ICD-10-CM | POA: Diagnosis not present

## 2016-02-20 DIAGNOSIS — E785 Hyperlipidemia, unspecified: Secondary | ICD-10-CM | POA: Diagnosis not present

## 2016-02-20 DIAGNOSIS — E039 Hypothyroidism, unspecified: Secondary | ICD-10-CM | POA: Diagnosis not present

## 2016-02-20 DIAGNOSIS — Z951 Presence of aortocoronary bypass graft: Secondary | ICD-10-CM | POA: Diagnosis not present

## 2016-02-20 DIAGNOSIS — Z87440 Personal history of urinary (tract) infections: Secondary | ICD-10-CM | POA: Diagnosis not present

## 2016-02-20 DIAGNOSIS — E1122 Type 2 diabetes mellitus with diabetic chronic kidney disease: Secondary | ICD-10-CM | POA: Diagnosis not present

## 2016-02-20 DIAGNOSIS — Z79899 Other long term (current) drug therapy: Secondary | ICD-10-CM | POA: Diagnosis not present

## 2016-02-20 DIAGNOSIS — Z992 Dependence on renal dialysis: Secondary | ICD-10-CM | POA: Diagnosis not present

## 2016-02-20 DIAGNOSIS — Z955 Presence of coronary angioplasty implant and graft: Secondary | ICD-10-CM | POA: Diagnosis not present

## 2016-02-20 DIAGNOSIS — Z882 Allergy status to sulfonamides status: Secondary | ICD-10-CM | POA: Diagnosis not present

## 2016-02-20 DIAGNOSIS — N186 End stage renal disease: Secondary | ICD-10-CM | POA: Diagnosis not present

## 2016-02-20 DIAGNOSIS — D899 Disorder involving the immune mechanism, unspecified: Secondary | ICD-10-CM | POA: Diagnosis not present

## 2016-02-20 DIAGNOSIS — E119 Type 2 diabetes mellitus without complications: Secondary | ICD-10-CM | POA: Diagnosis not present

## 2016-02-20 DIAGNOSIS — D8989 Other specified disorders involving the immune mechanism, not elsewhere classified: Secondary | ICD-10-CM | POA: Diagnosis not present

## 2016-02-20 DIAGNOSIS — I120 Hypertensive chronic kidney disease with stage 5 chronic kidney disease or end stage renal disease: Secondary | ICD-10-CM | POA: Diagnosis not present

## 2016-02-20 DIAGNOSIS — D649 Anemia, unspecified: Secondary | ICD-10-CM | POA: Diagnosis not present

## 2016-02-20 DIAGNOSIS — R339 Retention of urine, unspecified: Secondary | ICD-10-CM | POA: Diagnosis not present

## 2016-02-20 DIAGNOSIS — Z885 Allergy status to narcotic agent status: Secondary | ICD-10-CM | POA: Diagnosis not present

## 2016-02-20 DIAGNOSIS — Z4822 Encounter for aftercare following kidney transplant: Secondary | ICD-10-CM | POA: Diagnosis not present

## 2016-02-20 DIAGNOSIS — D631 Anemia in chronic kidney disease: Secondary | ICD-10-CM | POA: Diagnosis not present

## 2016-02-20 DIAGNOSIS — I2510 Atherosclerotic heart disease of native coronary artery without angina pectoris: Secondary | ICD-10-CM | POA: Diagnosis not present

## 2016-02-20 DIAGNOSIS — Z940 Kidney transplant status: Secondary | ICD-10-CM | POA: Diagnosis not present

## 2016-02-20 DIAGNOSIS — N401 Enlarged prostate with lower urinary tract symptoms: Secondary | ICD-10-CM | POA: Diagnosis not present

## 2016-02-20 DIAGNOSIS — I10 Essential (primary) hypertension: Secondary | ICD-10-CM | POA: Diagnosis not present

## 2016-02-20 DIAGNOSIS — Z792 Long term (current) use of antibiotics: Secondary | ICD-10-CM | POA: Diagnosis not present

## 2016-03-08 DIAGNOSIS — Z4822 Encounter for aftercare following kidney transplant: Secondary | ICD-10-CM | POA: Diagnosis not present

## 2016-03-08 DIAGNOSIS — N401 Enlarged prostate with lower urinary tract symptoms: Secondary | ICD-10-CM | POA: Diagnosis not present

## 2016-03-08 DIAGNOSIS — Z79899 Other long term (current) drug therapy: Secondary | ICD-10-CM | POA: Diagnosis not present

## 2016-03-08 DIAGNOSIS — E039 Hypothyroidism, unspecified: Secondary | ICD-10-CM | POA: Diagnosis present

## 2016-03-08 DIAGNOSIS — N138 Other obstructive and reflux uropathy: Secondary | ICD-10-CM | POA: Diagnosis present

## 2016-03-08 DIAGNOSIS — E119 Type 2 diabetes mellitus without complications: Secondary | ICD-10-CM | POA: Diagnosis present

## 2016-03-08 DIAGNOSIS — Z5181 Encounter for therapeutic drug level monitoring: Secondary | ICD-10-CM | POA: Diagnosis not present

## 2016-03-08 DIAGNOSIS — Z87891 Personal history of nicotine dependence: Secondary | ICD-10-CM | POA: Diagnosis not present

## 2016-03-08 DIAGNOSIS — I10 Essential (primary) hypertension: Secondary | ICD-10-CM | POA: Diagnosis present

## 2016-03-08 DIAGNOSIS — I2510 Atherosclerotic heart disease of native coronary artery without angina pectoris: Secondary | ICD-10-CM | POA: Diagnosis present

## 2016-03-08 DIAGNOSIS — Z951 Presence of aortocoronary bypass graft: Secondary | ICD-10-CM | POA: Diagnosis not present

## 2016-03-08 DIAGNOSIS — N400 Enlarged prostate without lower urinary tract symptoms: Secondary | ICD-10-CM | POA: Diagnosis not present

## 2016-03-08 DIAGNOSIS — R338 Other retention of urine: Secondary | ICD-10-CM | POA: Diagnosis present

## 2016-03-08 DIAGNOSIS — Z940 Kidney transplant status: Secondary | ICD-10-CM | POA: Diagnosis not present

## 2016-03-08 DIAGNOSIS — E785 Hyperlipidemia, unspecified: Secondary | ICD-10-CM | POA: Diagnosis present

## 2016-03-18 DIAGNOSIS — Z9079 Acquired absence of other genital organ(s): Secondary | ICD-10-CM | POA: Diagnosis not present

## 2016-03-18 DIAGNOSIS — Z951 Presence of aortocoronary bypass graft: Secondary | ICD-10-CM | POA: Diagnosis not present

## 2016-03-18 DIAGNOSIS — Z940 Kidney transplant status: Secondary | ICD-10-CM | POA: Diagnosis not present

## 2016-03-18 DIAGNOSIS — E119 Type 2 diabetes mellitus without complications: Secondary | ICD-10-CM | POA: Diagnosis not present

## 2016-03-18 DIAGNOSIS — Z9889 Other specified postprocedural states: Secondary | ICD-10-CM | POA: Diagnosis not present

## 2016-03-18 DIAGNOSIS — D649 Anemia, unspecified: Secondary | ICD-10-CM | POA: Diagnosis not present

## 2016-03-18 DIAGNOSIS — E1122 Type 2 diabetes mellitus with diabetic chronic kidney disease: Secondary | ICD-10-CM | POA: Diagnosis not present

## 2016-03-18 DIAGNOSIS — I120 Hypertensive chronic kidney disease with stage 5 chronic kidney disease or end stage renal disease: Secondary | ICD-10-CM | POA: Diagnosis not present

## 2016-03-18 DIAGNOSIS — Z4822 Encounter for aftercare following kidney transplant: Secondary | ICD-10-CM | POA: Diagnosis not present

## 2016-03-18 DIAGNOSIS — Z87891 Personal history of nicotine dependence: Secondary | ICD-10-CM | POA: Diagnosis not present

## 2016-03-18 DIAGNOSIS — N390 Urinary tract infection, site not specified: Secondary | ICD-10-CM | POA: Diagnosis not present

## 2016-03-18 DIAGNOSIS — E785 Hyperlipidemia, unspecified: Secondary | ICD-10-CM | POA: Diagnosis not present

## 2016-03-18 DIAGNOSIS — A498 Other bacterial infections of unspecified site: Secondary | ICD-10-CM | POA: Diagnosis not present

## 2016-03-18 DIAGNOSIS — Z79899 Other long term (current) drug therapy: Secondary | ICD-10-CM | POA: Diagnosis not present

## 2016-03-18 DIAGNOSIS — Z5181 Encounter for therapeutic drug level monitoring: Secondary | ICD-10-CM | POA: Diagnosis not present

## 2016-03-18 DIAGNOSIS — Z794 Long term (current) use of insulin: Secondary | ICD-10-CM | POA: Diagnosis not present

## 2016-03-18 DIAGNOSIS — E782 Mixed hyperlipidemia: Secondary | ICD-10-CM | POA: Diagnosis not present

## 2016-03-18 DIAGNOSIS — N179 Acute kidney failure, unspecified: Secondary | ICD-10-CM | POA: Diagnosis not present

## 2016-03-18 DIAGNOSIS — N186 End stage renal disease: Secondary | ICD-10-CM | POA: Diagnosis not present

## 2016-03-18 DIAGNOSIS — N401 Enlarged prostate with lower urinary tract symptoms: Secondary | ICD-10-CM | POA: Diagnosis not present

## 2016-03-18 DIAGNOSIS — R296 Repeated falls: Secondary | ICD-10-CM | POA: Diagnosis not present

## 2016-03-18 DIAGNOSIS — N12 Tubulo-interstitial nephritis, not specified as acute or chronic: Secondary | ICD-10-CM | POA: Diagnosis not present

## 2016-04-01 DIAGNOSIS — Z4822 Encounter for aftercare following kidney transplant: Secondary | ICD-10-CM | POA: Diagnosis not present

## 2016-04-01 DIAGNOSIS — Z79899 Other long term (current) drug therapy: Secondary | ICD-10-CM | POA: Diagnosis not present

## 2016-04-01 DIAGNOSIS — Z940 Kidney transplant status: Secondary | ICD-10-CM | POA: Diagnosis not present

## 2016-04-01 DIAGNOSIS — Z882 Allergy status to sulfonamides status: Secondary | ICD-10-CM | POA: Diagnosis not present

## 2016-04-01 DIAGNOSIS — D631 Anemia in chronic kidney disease: Secondary | ICD-10-CM | POA: Diagnosis not present

## 2016-04-01 DIAGNOSIS — E785 Hyperlipidemia, unspecified: Secondary | ICD-10-CM | POA: Diagnosis not present

## 2016-04-01 DIAGNOSIS — Z794 Long term (current) use of insulin: Secondary | ICD-10-CM | POA: Diagnosis not present

## 2016-04-01 DIAGNOSIS — N186 End stage renal disease: Secondary | ICD-10-CM | POA: Diagnosis not present

## 2016-04-01 DIAGNOSIS — I120 Hypertensive chronic kidney disease with stage 5 chronic kidney disease or end stage renal disease: Secondary | ICD-10-CM | POA: Diagnosis not present

## 2016-04-01 DIAGNOSIS — E1122 Type 2 diabetes mellitus with diabetic chronic kidney disease: Secondary | ICD-10-CM | POA: Diagnosis not present

## 2016-04-01 DIAGNOSIS — E039 Hypothyroidism, unspecified: Secondary | ICD-10-CM | POA: Diagnosis not present

## 2016-04-01 DIAGNOSIS — Z992 Dependence on renal dialysis: Secondary | ICD-10-CM | POA: Diagnosis not present

## 2016-04-01 DIAGNOSIS — I10 Essential (primary) hypertension: Secondary | ICD-10-CM | POA: Diagnosis not present

## 2016-04-01 DIAGNOSIS — Z7982 Long term (current) use of aspirin: Secondary | ICD-10-CM | POA: Diagnosis not present

## 2016-04-01 DIAGNOSIS — R339 Retention of urine, unspecified: Secondary | ICD-10-CM | POA: Diagnosis not present

## 2016-04-01 DIAGNOSIS — Z951 Presence of aortocoronary bypass graft: Secondary | ICD-10-CM | POA: Diagnosis not present

## 2016-04-01 DIAGNOSIS — I2510 Atherosclerotic heart disease of native coronary artery without angina pectoris: Secondary | ICD-10-CM | POA: Diagnosis not present

## 2016-04-01 DIAGNOSIS — Z885 Allergy status to narcotic agent status: Secondary | ICD-10-CM | POA: Diagnosis not present

## 2016-04-01 DIAGNOSIS — E119 Type 2 diabetes mellitus without complications: Secondary | ICD-10-CM | POA: Diagnosis not present

## 2016-04-01 DIAGNOSIS — D899 Disorder involving the immune mechanism, unspecified: Secondary | ICD-10-CM | POA: Diagnosis not present

## 2016-04-09 DIAGNOSIS — E7800 Pure hypercholesterolemia, unspecified: Secondary | ICD-10-CM | POA: Diagnosis not present

## 2016-04-09 DIAGNOSIS — K219 Gastro-esophageal reflux disease without esophagitis: Secondary | ICD-10-CM | POA: Diagnosis not present

## 2016-04-09 DIAGNOSIS — I10 Essential (primary) hypertension: Secondary | ICD-10-CM | POA: Diagnosis not present

## 2016-04-09 DIAGNOSIS — D551 Anemia due to other disorders of glutathione metabolism: Secondary | ICD-10-CM | POA: Diagnosis not present

## 2016-04-09 DIAGNOSIS — E039 Hypothyroidism, unspecified: Secondary | ICD-10-CM | POA: Diagnosis not present

## 2016-04-09 DIAGNOSIS — E875 Hyperkalemia: Secondary | ICD-10-CM | POA: Diagnosis not present

## 2016-04-13 DIAGNOSIS — E1165 Type 2 diabetes mellitus with hyperglycemia: Secondary | ICD-10-CM | POA: Diagnosis not present

## 2016-04-13 DIAGNOSIS — Z6822 Body mass index (BMI) 22.0-22.9, adult: Secondary | ICD-10-CM | POA: Diagnosis not present

## 2016-04-13 DIAGNOSIS — E039 Hypothyroidism, unspecified: Secondary | ICD-10-CM | POA: Diagnosis not present

## 2016-04-13 DIAGNOSIS — I10 Essential (primary) hypertension: Secondary | ICD-10-CM | POA: Diagnosis not present

## 2016-04-14 DIAGNOSIS — Z940 Kidney transplant status: Secondary | ICD-10-CM | POA: Diagnosis not present

## 2016-04-28 DIAGNOSIS — Z940 Kidney transplant status: Secondary | ICD-10-CM | POA: Diagnosis not present

## 2016-04-28 DIAGNOSIS — Z794 Long term (current) use of insulin: Secondary | ICD-10-CM | POA: Diagnosis not present

## 2016-04-28 DIAGNOSIS — Z951 Presence of aortocoronary bypass graft: Secondary | ICD-10-CM | POA: Diagnosis not present

## 2016-04-28 DIAGNOSIS — N12 Tubulo-interstitial nephritis, not specified as acute or chronic: Secondary | ICD-10-CM | POA: Diagnosis not present

## 2016-04-28 DIAGNOSIS — Z955 Presence of coronary angioplasty implant and graft: Secondary | ICD-10-CM | POA: Diagnosis not present

## 2016-04-28 DIAGNOSIS — Z87440 Personal history of urinary (tract) infections: Secondary | ICD-10-CM | POA: Diagnosis not present

## 2016-04-28 DIAGNOSIS — E119 Type 2 diabetes mellitus without complications: Secondary | ICD-10-CM | POA: Diagnosis not present

## 2016-04-28 DIAGNOSIS — I120 Hypertensive chronic kidney disease with stage 5 chronic kidney disease or end stage renal disease: Secondary | ICD-10-CM | POA: Diagnosis not present

## 2016-04-28 DIAGNOSIS — Z4822 Encounter for aftercare following kidney transplant: Secondary | ICD-10-CM | POA: Diagnosis not present

## 2016-04-28 DIAGNOSIS — D8989 Other specified disorders involving the immune mechanism, not elsewhere classified: Secondary | ICD-10-CM | POA: Diagnosis not present

## 2016-04-28 DIAGNOSIS — Z950 Presence of cardiac pacemaker: Secondary | ICD-10-CM | POA: Diagnosis not present

## 2016-04-28 DIAGNOSIS — D899 Disorder involving the immune mechanism, unspecified: Secondary | ICD-10-CM | POA: Diagnosis not present

## 2016-04-28 DIAGNOSIS — E039 Hypothyroidism, unspecified: Secondary | ICD-10-CM | POA: Diagnosis not present

## 2016-04-28 DIAGNOSIS — D649 Anemia, unspecified: Secondary | ICD-10-CM | POA: Diagnosis not present

## 2016-04-28 DIAGNOSIS — Z7982 Long term (current) use of aspirin: Secondary | ICD-10-CM | POA: Diagnosis not present

## 2016-04-28 DIAGNOSIS — I2510 Atherosclerotic heart disease of native coronary artery without angina pectoris: Secondary | ICD-10-CM | POA: Diagnosis not present

## 2016-04-28 DIAGNOSIS — N186 End stage renal disease: Secondary | ICD-10-CM | POA: Diagnosis not present

## 2016-04-28 DIAGNOSIS — E1122 Type 2 diabetes mellitus with diabetic chronic kidney disease: Secondary | ICD-10-CM | POA: Diagnosis not present

## 2016-04-28 DIAGNOSIS — Z79899 Other long term (current) drug therapy: Secondary | ICD-10-CM | POA: Diagnosis not present

## 2016-04-28 DIAGNOSIS — N320 Bladder-neck obstruction: Secondary | ICD-10-CM | POA: Diagnosis not present

## 2016-04-28 DIAGNOSIS — E785 Hyperlipidemia, unspecified: Secondary | ICD-10-CM | POA: Diagnosis not present

## 2016-04-28 DIAGNOSIS — Z885 Allergy status to narcotic agent status: Secondary | ICD-10-CM | POA: Diagnosis not present

## 2016-04-28 DIAGNOSIS — Z882 Allergy status to sulfonamides status: Secondary | ICD-10-CM | POA: Diagnosis not present

## 2016-04-28 DIAGNOSIS — R7881 Bacteremia: Secondary | ICD-10-CM | POA: Diagnosis not present

## 2016-04-29 ENCOUNTER — Ambulatory Visit (INDEPENDENT_AMBULATORY_CARE_PROVIDER_SITE_OTHER): Payer: Medicare Other | Admitting: Cardiovascular Disease

## 2016-04-29 ENCOUNTER — Encounter: Payer: Self-pay | Admitting: Cardiovascular Disease

## 2016-04-29 VITALS — BP 158/68 | HR 61 | Ht 69.0 in | Wt 166.0 lb

## 2016-04-29 DIAGNOSIS — E7800 Pure hypercholesterolemia, unspecified: Secondary | ICD-10-CM | POA: Diagnosis not present

## 2016-04-29 DIAGNOSIS — I6523 Occlusion and stenosis of bilateral carotid arteries: Secondary | ICD-10-CM | POA: Diagnosis not present

## 2016-04-29 DIAGNOSIS — I209 Angina pectoris, unspecified: Secondary | ICD-10-CM

## 2016-04-29 DIAGNOSIS — Z951 Presence of aortocoronary bypass graft: Secondary | ICD-10-CM | POA: Diagnosis not present

## 2016-04-29 DIAGNOSIS — I25708 Atherosclerosis of coronary artery bypass graft(s), unspecified, with other forms of angina pectoris: Secondary | ICD-10-CM | POA: Diagnosis not present

## 2016-04-29 DIAGNOSIS — I10 Essential (primary) hypertension: Secondary | ICD-10-CM

## 2016-04-29 DIAGNOSIS — I350 Nonrheumatic aortic (valve) stenosis: Secondary | ICD-10-CM | POA: Diagnosis not present

## 2016-04-29 MED ORDER — AMLODIPINE BESYLATE 5 MG PO TABS: 5.0000 mg | ORAL_TABLET | Freq: Every day | ORAL | 6 refills | Status: DC

## 2016-04-29 NOTE — Progress Notes (Signed)
SUBJECTIVE: The patient presents for follow-up of coronary artery disease with a history of 3 vessel CABG on 10/17/15. He also has a history of a kidney transplant on 09/08/15. He also has hypertension.  With regards to coronary artery disease, he has been doing relatively well. He very seldom has right sided chest twinges and has not used nitroglycerin. He denies shortness of breath.  With regards to hypertension, blood pressures have been elevated in the 160/50 range.  With regards to diabetes, morning blood sugars run anywhere from 97 up to 120 and afternoon blood sugars run anywhere from 180-190.  He tells me he had his prostate removed in February of this year due to recurrent urinary tract infections.  Due to recurrent urinary tract infections, he was unable to participate in cardiac rehabilitation shortly after CABG.  I personally reviewed the echocardiogram report from February 2017 which demonstrated normal left ventricular systolic function, LVEF 23-30%, with moderate LVH and aortic stenosis.  ECG performed in the office today which I ordered and personally interpreted demonstrates normal sinus rhythm with right bundle branch block, left anterior fascicular block, and LVH with repolarization abnormalities.   Review of Systems: As per "subjective", otherwise negative.  Allergies  Allergen Reactions  . Codeine Nausea Only  . Sulfa Antibiotics Nausea Only    Current Outpatient Prescriptions  Medication Sig Dispense Refill  . aspirin EC 81 MG tablet Take 162 mg by mouth daily.    . dapsone 25 MG tablet Take 2 tablets by mouth daily.    Marland Kitchen docusate sodium (COLACE) 100 MG capsule Take 100 mg by mouth 2 (two) times daily as needed.    . insulin glargine (LANTUS SOLOSTAR) 100 UNIT/ML injection Inject 18 Units into the skin daily.     Marland Kitchen levothyroxine (SYNTHROID, LEVOTHROID) 125 MCG tablet Take 125 mcg by mouth daily before breakfast.    . metoprolol (LOPRESSOR) 50 MG tablet  Take 1.5 tablets by mouth 2 (two) times daily.    . mycophenolate (MYFORTIC) 180 MG EC tablet Take 360 mg by mouth 2 (two) times daily.    . nitroGLYCERIN (NITROSTAT) 0.4 MG SL tablet Place 1 tablet (0.4 mg total) under the tongue every 5 (five) minutes x 3 doses as needed for chest pain. If no relief after 3 rd dose, proceed to the ED for an evaluation 25 tablet 3  . NOVOLOG FLEXPEN 100 UNIT/ML FlexPen Inject 9 Units into the skin 3 (three) times daily. Plus sliding scale  1  . omeprazole (PRILOSEC) 20 MG capsule Take 1 capsule by mouth daily.    Marland Kitchen oxyCODONE (OXY IR/ROXICODONE) 5 MG immediate release tablet Take 5 mg by mouth every 4 (four) hours as needed. for pain  0  . polyethylene glycol powder (GLYCOLAX/MIRALAX) powder Take 17 g by mouth daily as needed.    . predniSONE (DELTASONE) 5 MG tablet Take 5 mg by mouth daily.    . simvastatin (ZOCOR) 40 MG tablet Take 1 tablet by mouth daily.    . Tacrolimus 1 MG TB24 Take 3 mg by mouth daily.     . tamsulosin (FLOMAX) 0.4 MG CAPS capsule Take 1 capsule by mouth at bedtime.  5   No current facility-administered medications for this visit.    Facility-Administered Medications Ordered in Other Visits  Medication Dose Route Frequency Provider Last Rate Last Dose  . cyclopentolate-phenylephrine (CYCLOMYDRYL) 0.2-1 % ophthalmic solution 1 drop  1 drop Right Eye Once Williams Che, MD      .  ketorolac (ACULAR) 0.5 % ophthalmic solution 1 drop  1 drop Right Eye Once Williams Che, MD        Past Medical History:  Diagnosis Date  . Anemia   . Anginal pain ([place])    "a little."   . Aortic stenosis    Mild  . Chronic kidney disease    pt states 30% kidney function started dialysis 04/23/14 T/Th/Sa  . Constipation   . Coronary atherosclerosis of native coronary artery    DES LAD 8/03  . Essential hypertension, benign   . Heart murmur    "faint"  . Hypothyroidism   . Mixed hyperlipidemia   . Pneumonia   . Shortness of breath    with  exertion  . Type 2 diabetes mellitus ([place])     Past Surgical History:  Procedure Laterality Date  . AV FISTULA PLACEMENT Right 05/08/2014   Procedure: ARTERIOVENOUS (AV) FISTULA CREATION-RIGHT;  Surgeon: Mal Misty, MD;  Location: [place];  Service: Vascular;  Laterality: Right;  . CARDIAC CATHETERIZATION    . CATARACT EXTRACTION W/PHACO  02/07/2012   Procedure: CATARACT EXTRACTION PHACO AND INTRAOCULAR LENS PLACEMENT (IOC);  Surgeon: Williams Che, MD;  Location: [facility];  Service: Ophthalmology;  Laterality: Right;  CDE:24.87  . CATARACT EXTRACTION W/PHACO Left 04/17/2012   Procedure: CATARACT EXTRACTION PHACO AND INTRAOCULAR LENS PLACEMENT (IOC);  Surgeon: Williams Che, MD;  Location: [facility];  Service: Ophthalmology;  Laterality: Left;  CDE=20.75  . COLONOSCOPY    . cyst removed     from back  . INSERTION OF DIALYSIS CATHETER Right 04/22/2014   Procedure: INSERTION OF DIALYSIS CATHETER RIGHT INTERNAL JUGULAR VEIN;  Surgeon: Rosetta Posner, MD;  Location: [place];  Service: Vascular;  Laterality: Right;  . jaw implant    . NEPHRECTOMY     Left   . ROTATOR CUFF REPAIR Left    left  . VASECTOMY      Social History   Social History  . Marital status: Married    Spouse name: N/A  . Number of children: N/A  . Years of education: N/A   Occupational History  . Not on file.   Social History Main Topics  . Smoking status: Former Smoker    Packs/day: 1.50    Years: 11.00    Types: Cigarettes    Start date: 08/14/1955    Quit date: 12/15/1966  . Smokeless tobacco: Never Used  . Alcohol use No  . Drug use: No  . Sexual activity: Not on file   Other Topics Concern  . Not on file   Social History Narrative  . No narrative on file     Vitals:   04/29/16 1103  BP: (!) 158/68  Pulse: 61  SpO2: 92%  Weight: 166 lb (75.3 kg)  Height: 5\' 9"  (1.753 m)    PHYSICAL EXAM General: NAD HEENT: Normal. Neck: No JVD, no thyromegaly. Lungs: Clear to auscultation bilaterally  with normal respiratory effort. CV: Nondisplaced PMI.  Regular rate and rhythm, normal S1/S2, no O5/D6, 1/6 systolic murmur over RUSB. No pretibial or periankle edema.  No carotid bruit.   Abdomen: Soft, nontender, no distention.  Neurologic: Alert and oriented.  Psych: Normal affect. Skin: Normal. Musculoskeletal: No gross deformities.    ECG: Most recent ECG reviewed.      ASSESSMENT AND PLAN: 1. Coronary artery disease with 3 vessel CABG on 10/17/15: Symptomatically stable. Continue aspirin, beta blocker, and statin. I will make a referral to  cardiac rehabilitation.  2. Hypertension: Remains persistently elevated. I will start amlodipine 5 mg daily.  3. Aortic stenosis: Mild with a mean gradient of 7 mmHg. Continue surveillance echocardiography.  4. Hyperlipidemia: Continue simvastatin.  5. History of renal transplant in August 2017: Stable. On immunosuppressive therapy.  6. Carotid artery stenosis: Mild bilateral disease in January 2017. Will repeat in next 2-3 years.  7. Type 2 diabetes: Sugars reviewed above. Continue insulin.  Dispo: fu 6 months.   Kate Sable, M.D., F.A.C.C.

## 2016-04-29 NOTE — Patient Instructions (Signed)
Medication Instructions:   Begin Amlodipine 5mg  daily.  Continue all other medications.    Labwork: none  Testing/Procedures: none  Referrals:  Cardiac Rehab   Follow-Up: Your physician wants you to follow up in: 6 months.  You will receive a reminder letter in the mail one-two months in advance.  If you don't receive a letter, please call our office to schedule the follow up appointment   Any Other Special Instructions Will Be Listed Below (If Applicable).  If you need a refill on your cardiac medications before your next appointment, please call your pharmacy.

## 2016-04-30 IMAGING — NM NM MYOCAR MULTI W/SPECT W/WALL MOTION & EF
2 series · 12 of 12 positions shown · non-contrast
Comparison: None.

CLINICAL DATA: Patient with coronary artery disease and prior stent
placement with complaints of progressive fatigue referred for an
ischemic evaluation.

EXAM:
MYOCARDIAL IMAGING WITH SPECT (REST AND PHARMACOLOGIC-STRESS)
GATED LEFT VENTRICULAR WALL MOTION STUDY
LEFT VENTRICULAR EJECTION FRACTION
TECHNIQUE: Standard myocardial SPECT imaging was performed after resting
intravenous injection of 10 mCi Lc-BBm sestamibi. Subsequently,
intravenous infusion of Lexiscan was performed under the supervision
of the Cardiology staff. At peak effect of the drug, 30 mCi Lc-BBm
sestamibi was injected intravenously and standard myocardial SPECT
imaging was performed. Quantitative gated imaging was also performed
to evaluate left ventricular wall motion, and estimate left
ventricular ejection fraction.

[Series 1: rest · 8.28mm/px · 6 of 64 frames shown]
[frame 6/64]
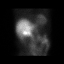
[frame 16/64]
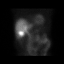
[frame 27/64]
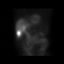
[frame 38/64]
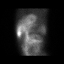
[frame 48/64]
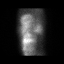
[frame 59/64]
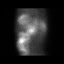

[Series 2: stress gated · 8.28mm/px · 6 of 64 frames shown]
[frame 6/64]
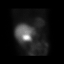
[frame 16/64]
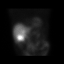
[frame 27/64]
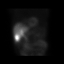
[frame 38/64]
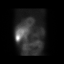
[frame 48/64]
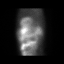
[frame 59/64]
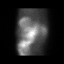

[12 of 12 positions shown; findings below may reference images not displayed]

FINDINGS: Stress data: The patient was stressed according to the Lexiscan
protocol. The heart rate ranged from 57 up to 73 beats per min. The
blood pressure average 149/69. No chest pain was reported. The
resting ECG demonstrated sinus rhythm with a right bundle branch
block and LVH. Secondary polarization abnormalities were noted.
There were no significant changes with stress.

Perfusion: There was a moderate sized region of mild to moderately
decreased uptake in the inferior and inferolateral wall extending
from the apex to the base. There were no significant changes when
compared to the rest images suggestive of myocardial scar.

Wall Motion: Mild inferior wall hypokinesis. No left ventricular
dilation.

Left Ventricular Ejection Fraction: 65 %

End diastolic volume 134 ml

End systolic volume 47 ml
IMPRESSION: 1. Myocardial scar noted in the inferior and inferolateral walls. No
evidence of ischemia.

2. Mild inferior wall hypokinesis.

3. Left ventricular ejection fraction 65%

4.  Low to intermediate-risk stress test findings*.

*4984 Appropriate Use Criteria for Coronary Revascularization
Focused Update: J Am Coll Cardiol. 4984;59(9):857-881.
[URL]

## 2016-05-25 DIAGNOSIS — R339 Retention of urine, unspecified: Secondary | ICD-10-CM | POA: Diagnosis not present

## 2016-05-25 DIAGNOSIS — D631 Anemia in chronic kidney disease: Secondary | ICD-10-CM | POA: Diagnosis not present

## 2016-05-25 DIAGNOSIS — E119 Type 2 diabetes mellitus without complications: Secondary | ICD-10-CM | POA: Diagnosis not present

## 2016-05-25 DIAGNOSIS — N186 End stage renal disease: Secondary | ICD-10-CM | POA: Diagnosis not present

## 2016-05-25 DIAGNOSIS — Z992 Dependence on renal dialysis: Secondary | ICD-10-CM | POA: Diagnosis not present

## 2016-05-25 DIAGNOSIS — Z885 Allergy status to narcotic agent status: Secondary | ICD-10-CM | POA: Diagnosis not present

## 2016-05-25 DIAGNOSIS — E1122 Type 2 diabetes mellitus with diabetic chronic kidney disease: Secondary | ICD-10-CM | POA: Diagnosis not present

## 2016-05-25 DIAGNOSIS — N400 Enlarged prostate without lower urinary tract symptoms: Secondary | ICD-10-CM | POA: Diagnosis not present

## 2016-05-25 DIAGNOSIS — Z951 Presence of aortocoronary bypass graft: Secondary | ICD-10-CM | POA: Diagnosis not present

## 2016-05-25 DIAGNOSIS — I2510 Atherosclerotic heart disease of native coronary artery without angina pectoris: Secondary | ICD-10-CM | POA: Diagnosis not present

## 2016-05-25 DIAGNOSIS — Z4822 Encounter for aftercare following kidney transplant: Secondary | ICD-10-CM | POA: Diagnosis not present

## 2016-05-25 DIAGNOSIS — N320 Bladder-neck obstruction: Secondary | ICD-10-CM | POA: Diagnosis not present

## 2016-05-25 DIAGNOSIS — N12 Tubulo-interstitial nephritis, not specified as acute or chronic: Secondary | ICD-10-CM | POA: Diagnosis not present

## 2016-05-25 DIAGNOSIS — N390 Urinary tract infection, site not specified: Secondary | ICD-10-CM | POA: Diagnosis not present

## 2016-05-25 DIAGNOSIS — I10 Essential (primary) hypertension: Secondary | ICD-10-CM | POA: Diagnosis not present

## 2016-05-25 DIAGNOSIS — Z940 Kidney transplant status: Secondary | ICD-10-CM | POA: Diagnosis not present

## 2016-05-25 DIAGNOSIS — E039 Hypothyroidism, unspecified: Secondary | ICD-10-CM | POA: Diagnosis not present

## 2016-05-25 DIAGNOSIS — E785 Hyperlipidemia, unspecified: Secondary | ICD-10-CM | POA: Diagnosis not present

## 2016-05-25 DIAGNOSIS — Z9861 Coronary angioplasty status: Secondary | ICD-10-CM | POA: Diagnosis not present

## 2016-05-25 DIAGNOSIS — Z9889 Other specified postprocedural states: Secondary | ICD-10-CM | POA: Diagnosis not present

## 2016-05-25 DIAGNOSIS — R7881 Bacteremia: Secondary | ICD-10-CM | POA: Diagnosis not present

## 2016-05-25 DIAGNOSIS — D8989 Other specified disorders involving the immune mechanism, not elsewhere classified: Secondary | ICD-10-CM | POA: Diagnosis not present

## 2016-05-25 DIAGNOSIS — I120 Hypertensive chronic kidney disease with stage 5 chronic kidney disease or end stage renal disease: Secondary | ICD-10-CM | POA: Diagnosis not present

## 2016-05-25 DIAGNOSIS — Z79899 Other long term (current) drug therapy: Secondary | ICD-10-CM | POA: Diagnosis not present

## 2016-05-25 DIAGNOSIS — D899 Disorder involving the immune mechanism, unspecified: Secondary | ICD-10-CM | POA: Diagnosis not present

## 2016-05-25 DIAGNOSIS — Z792 Long term (current) use of antibiotics: Secondary | ICD-10-CM | POA: Diagnosis not present

## 2016-05-25 DIAGNOSIS — Z882 Allergy status to sulfonamides status: Secondary | ICD-10-CM | POA: Diagnosis not present

## 2016-05-25 DIAGNOSIS — Z794 Long term (current) use of insulin: Secondary | ICD-10-CM | POA: Diagnosis not present

## 2016-05-25 DIAGNOSIS — Z7982 Long term (current) use of aspirin: Secondary | ICD-10-CM | POA: Diagnosis not present

## 2016-06-09 DIAGNOSIS — Z79899 Other long term (current) drug therapy: Secondary | ICD-10-CM | POA: Diagnosis not present

## 2016-06-09 DIAGNOSIS — D899 Disorder involving the immune mechanism, unspecified: Secondary | ICD-10-CM | POA: Diagnosis not present

## 2016-06-09 DIAGNOSIS — N390 Urinary tract infection, site not specified: Secondary | ICD-10-CM | POA: Diagnosis not present

## 2016-06-09 DIAGNOSIS — N400 Enlarged prostate without lower urinary tract symptoms: Secondary | ICD-10-CM | POA: Diagnosis not present

## 2016-06-09 DIAGNOSIS — D649 Anemia, unspecified: Secondary | ICD-10-CM | POA: Diagnosis not present

## 2016-06-09 DIAGNOSIS — N12 Tubulo-interstitial nephritis, not specified as acute or chronic: Secondary | ICD-10-CM | POA: Diagnosis not present

## 2016-06-09 DIAGNOSIS — Z951 Presence of aortocoronary bypass graft: Secondary | ICD-10-CM | POA: Diagnosis not present

## 2016-06-09 DIAGNOSIS — I2510 Atherosclerotic heart disease of native coronary artery without angina pectoris: Secondary | ICD-10-CM | POA: Diagnosis not present

## 2016-06-09 DIAGNOSIS — N186 End stage renal disease: Secondary | ICD-10-CM | POA: Diagnosis not present

## 2016-06-09 DIAGNOSIS — E785 Hyperlipidemia, unspecified: Secondary | ICD-10-CM | POA: Diagnosis not present

## 2016-06-09 DIAGNOSIS — Z940 Kidney transplant status: Secondary | ICD-10-CM | POA: Diagnosis not present

## 2016-06-09 DIAGNOSIS — N320 Bladder-neck obstruction: Secondary | ICD-10-CM | POA: Diagnosis not present

## 2016-06-09 DIAGNOSIS — Z794 Long term (current) use of insulin: Secondary | ICD-10-CM | POA: Diagnosis not present

## 2016-06-09 DIAGNOSIS — E039 Hypothyroidism, unspecified: Secondary | ICD-10-CM | POA: Diagnosis not present

## 2016-06-09 DIAGNOSIS — Z7982 Long term (current) use of aspirin: Secondary | ICD-10-CM | POA: Diagnosis not present

## 2016-06-09 DIAGNOSIS — N179 Acute kidney failure, unspecified: Secondary | ICD-10-CM | POA: Diagnosis not present

## 2016-06-09 DIAGNOSIS — Z955 Presence of coronary angioplasty implant and graft: Secondary | ICD-10-CM | POA: Diagnosis not present

## 2016-06-09 DIAGNOSIS — R7881 Bacteremia: Secondary | ICD-10-CM | POA: Diagnosis not present

## 2016-06-09 DIAGNOSIS — Z882 Allergy status to sulfonamides status: Secondary | ICD-10-CM | POA: Diagnosis not present

## 2016-06-09 DIAGNOSIS — I10 Essential (primary) hypertension: Secondary | ICD-10-CM | POA: Diagnosis not present

## 2016-06-09 DIAGNOSIS — Z4822 Encounter for aftercare following kidney transplant: Secondary | ICD-10-CM | POA: Diagnosis not present

## 2016-06-09 DIAGNOSIS — R1031 Right lower quadrant pain: Secondary | ICD-10-CM | POA: Diagnosis not present

## 2016-06-09 DIAGNOSIS — Z885 Allergy status to narcotic agent status: Secondary | ICD-10-CM | POA: Diagnosis not present

## 2016-06-09 DIAGNOSIS — E119 Type 2 diabetes mellitus without complications: Secondary | ICD-10-CM | POA: Diagnosis not present

## 2016-06-09 DIAGNOSIS — E1122 Type 2 diabetes mellitus with diabetic chronic kidney disease: Secondary | ICD-10-CM | POA: Diagnosis not present

## 2016-06-09 DIAGNOSIS — I120 Hypertensive chronic kidney disease with stage 5 chronic kidney disease or end stage renal disease: Secondary | ICD-10-CM | POA: Diagnosis not present

## 2016-06-09 DIAGNOSIS — D8989 Other specified disorders involving the immune mechanism, not elsewhere classified: Secondary | ICD-10-CM | POA: Diagnosis not present

## 2016-06-10 DIAGNOSIS — R1031 Right lower quadrant pain: Secondary | ICD-10-CM | POA: Diagnosis not present

## 2016-06-10 DIAGNOSIS — Z940 Kidney transplant status: Secondary | ICD-10-CM | POA: Diagnosis not present

## 2016-06-10 DIAGNOSIS — K828 Other specified diseases of gallbladder: Secondary | ICD-10-CM | POA: Diagnosis not present

## 2016-06-10 DIAGNOSIS — R17 Unspecified jaundice: Secondary | ICD-10-CM | POA: Diagnosis not present

## 2016-06-14 DIAGNOSIS — N400 Enlarged prostate without lower urinary tract symptoms: Secondary | ICD-10-CM | POA: Diagnosis not present

## 2016-06-14 DIAGNOSIS — E1122 Type 2 diabetes mellitus with diabetic chronic kidney disease: Secondary | ICD-10-CM | POA: Diagnosis not present

## 2016-06-14 DIAGNOSIS — Z794 Long term (current) use of insulin: Secondary | ICD-10-CM | POA: Diagnosis not present

## 2016-06-14 DIAGNOSIS — E039 Hypothyroidism, unspecified: Secondary | ICD-10-CM | POA: Diagnosis not present

## 2016-06-14 DIAGNOSIS — D899 Disorder involving the immune mechanism, unspecified: Secondary | ICD-10-CM | POA: Diagnosis not present

## 2016-06-14 DIAGNOSIS — D8989 Other specified disorders involving the immune mechanism, not elsewhere classified: Secondary | ICD-10-CM | POA: Diagnosis not present

## 2016-06-14 DIAGNOSIS — N12 Tubulo-interstitial nephritis, not specified as acute or chronic: Secondary | ICD-10-CM | POA: Diagnosis not present

## 2016-06-14 DIAGNOSIS — Z882 Allergy status to sulfonamides status: Secondary | ICD-10-CM | POA: Diagnosis not present

## 2016-06-14 DIAGNOSIS — Z7982 Long term (current) use of aspirin: Secondary | ICD-10-CM | POA: Diagnosis not present

## 2016-06-14 DIAGNOSIS — Z955 Presence of coronary angioplasty implant and graft: Secondary | ICD-10-CM | POA: Diagnosis not present

## 2016-06-14 DIAGNOSIS — N390 Urinary tract infection, site not specified: Secondary | ICD-10-CM | POA: Diagnosis not present

## 2016-06-14 DIAGNOSIS — I120 Hypertensive chronic kidney disease with stage 5 chronic kidney disease or end stage renal disease: Secondary | ICD-10-CM | POA: Diagnosis not present

## 2016-06-14 DIAGNOSIS — I2510 Atherosclerotic heart disease of native coronary artery without angina pectoris: Secondary | ICD-10-CM | POA: Diagnosis not present

## 2016-06-14 DIAGNOSIS — Z4822 Encounter for aftercare following kidney transplant: Secondary | ICD-10-CM | POA: Diagnosis not present

## 2016-06-14 DIAGNOSIS — E119 Type 2 diabetes mellitus without complications: Secondary | ICD-10-CM | POA: Diagnosis not present

## 2016-06-14 DIAGNOSIS — R1031 Right lower quadrant pain: Secondary | ICD-10-CM | POA: Diagnosis not present

## 2016-06-14 DIAGNOSIS — Z885 Allergy status to narcotic agent status: Secondary | ICD-10-CM | POA: Diagnosis not present

## 2016-06-14 DIAGNOSIS — R7881 Bacteremia: Secondary | ICD-10-CM | POA: Diagnosis not present

## 2016-06-14 DIAGNOSIS — Z940 Kidney transplant status: Secondary | ICD-10-CM | POA: Diagnosis not present

## 2016-06-14 DIAGNOSIS — Z79899 Other long term (current) drug therapy: Secondary | ICD-10-CM | POA: Diagnosis not present

## 2016-06-14 DIAGNOSIS — E785 Hyperlipidemia, unspecified: Secondary | ICD-10-CM | POA: Diagnosis not present

## 2016-06-14 DIAGNOSIS — I10 Essential (primary) hypertension: Secondary | ICD-10-CM | POA: Diagnosis not present

## 2016-06-14 DIAGNOSIS — Z951 Presence of aortocoronary bypass graft: Secondary | ICD-10-CM | POA: Diagnosis not present

## 2016-06-14 DIAGNOSIS — D649 Anemia, unspecified: Secondary | ICD-10-CM | POA: Diagnosis not present

## 2016-06-14 DIAGNOSIS — N186 End stage renal disease: Secondary | ICD-10-CM | POA: Diagnosis not present

## 2016-06-15 ENCOUNTER — Encounter (HOSPITAL_COMMUNITY)
Admission: RE | Admit: 2016-06-15 | Discharge: 2016-06-15 | Disposition: A | Discharge status: Home / Self Care | Payer: Medicare Other | Source: Ambulatory Visit | Attending: Cardiovascular Disease | Admitting: Cardiovascular Disease

## 2016-06-15 VITALS — BP 170/62 | HR 55 | Ht 69.0 in | Wt 166.4 lb

## 2016-06-15 DIAGNOSIS — Z9889 Other specified postprocedural states: Secondary | ICD-10-CM | POA: Insufficient documentation

## 2016-06-15 DIAGNOSIS — Z951 Presence of aortocoronary bypass graft: Secondary | ICD-10-CM | POA: Diagnosis not present

## 2016-06-15 DIAGNOSIS — Z48812 Encounter for surgical aftercare following surgery on the circulatory system: Secondary | ICD-10-CM | POA: Diagnosis not present

## 2016-06-15 NOTE — Progress Notes (Signed)
Cardiac Individual Treatment Plan  Patient Details  Name: Corey Riley MRN: [id number] Date of Birth: 1935-07-13 Referring Provider:     [name] from 06/15/2016 in [place]  Referring Provider  Dr. Bronson Ing      Initial Encounter Date:    [name] from 06/15/2016 in [place]  Date  06/15/16  Referring Provider  Dr. Bronson Ing      Visit Diagnosis: S/P CABG x 3  Patient's Home Medications on Admission:  Current Outpatient Prescriptions:  .  atorvastatin (LIPITOR) 20 MG tablet, Take 20 mg by mouth every Monday, Wednesday, and Friday., Disp: , Rfl:  .  amLODipine (NORVASC) 5 MG tablet, Take 1 tablet (5 mg total) by mouth daily. (Patient taking differently: Take 10 mg by mouth daily. ), Disp: 30 tablet, Rfl: 6 .  aspirin EC 81 MG tablet, Take 162 mg by mouth daily., Disp: , Rfl:  .  dapsone 25 MG tablet, Take 2 tablets by mouth daily., Disp: , Rfl:  .  docusate sodium (COLACE) 100 MG capsule, Take 100 mg by mouth 2 (two) times daily as needed., Disp: , Rfl:  .  insulin glargine (LANTUS SOLOSTAR) 100 UNIT/ML injection, Inject 18 Units into the skin daily. , Disp: , Rfl:  .  levothyroxine (SYNTHROID, LEVOTHROID) 125 MCG tablet, Take 125 mcg by mouth daily before breakfast., Disp: , Rfl:  .  metoprolol (LOPRESSOR) 50 MG tablet, Take 1 tablet by mouth 2 (two) times daily. Has 100 mg tab currently that he cuts in half, Disp: , Rfl:  .  mycophenolate (MYFORTIC) 180 MG EC tablet, Take 180 mg by mouth 2 (two) times daily. , Disp: , Rfl:  .  nitroGLYCERIN (NITROSTAT) 0.4 MG SL tablet, Place 1 tablet (0.4 mg total) under the tongue every 5 (five) minutes x 3 doses as needed for chest pain. If no relief after 3 rd dose, proceed to the ED for an evaluation, Disp: 25 tablet, Rfl: 3 .  NOVOLOG FLEXPEN 100 UNIT/ML FlexPen, Inject 10 Units into the skin 3 (three) times daily. Plus sliding scale,  Disp: , Rfl: 1 .  omeprazole (PRILOSEC) 20 MG capsule, Take 1 capsule by mouth daily., Disp: , Rfl:  .  oxyCODONE (OXY IR/ROXICODONE) 5 MG immediate release tablet, Take 5 mg by mouth every 4 (four) hours as needed. for pain, Disp: , Rfl: 0 .  polyethylene glycol powder (GLYCOLAX/MIRALAX) powder, Take 17 g by mouth daily as needed., Disp: , Rfl:  .  predniSONE (DELTASONE) 5 MG tablet, Take 5 mg by mouth daily., Disp: , Rfl:  .  simvastatin (ZOCOR) 40 MG tablet, Take 1 tablet by mouth daily., Disp: , Rfl:  .  Tacrolimus 1 MG TB24, Take 2 mg by mouth daily. , Disp: , Rfl:  .  tamsulosin (FLOMAX) 0.4 MG CAPS capsule, Take 1 capsule by mouth at bedtime., Disp: , Rfl: 5 No current facility-administered medications for this encounter.   Facility-Administered Medications Ordered in Other Encounters:  .  cyclopentolate-phenylephrine (CYCLOMYDRYL) 0.2-1 % ophthalmic solution 1 drop, 1 drop, Right Eye, Once, Williams Che, MD .  ketorolac (ACULAR) 0.5 % ophthalmic solution 1 drop, 1 drop, Right Eye, Once, Williams Che, MD  Past Medical History: Past Medical History:  Diagnosis Date  . Anemia   . Anginal pain ([facility])    "a little."   . Aortic stenosis    Mild  . Chronic kidney disease    pt  states 30% kidney function started dialysis 04/23/14 T/Th/Sa  . Constipation   . Coronary atherosclerosis of native coronary artery    DES LAD 8/03  . Essential hypertension, benign   . Heart murmur    "faint"  . Hypothyroidism   . Mixed hyperlipidemia   . Pneumonia   . Shortness of breath    with exertion  . Type 2 diabetes mellitus ([facility])     Tobacco Use: History  Smoking Status  . Former Smoker  . Packs/day: 1.50  . Years: 11.00  . Types: Cigarettes  . Start date: 08/14/1955  . Quit date: 12/15/1966  Smokeless Tobacco  . Never Used    Labs: Recent Review Flowsheet Data    There is no flowsheet data to display.      Capillary Blood Glucose: Lab Results  Component Value Date    GLUCAP 128 (H) 05/08/2014   GLUCAP 151 (H) 04/22/2014   GLUCAP 136 (H) 04/22/2014   GLUCAP 146 (H) 04/22/2014   GLUCAP 147 (H) 04/17/2012     Exercise Target Goals: Date: 06/15/16  Exercise Program Goal: Individual exercise prescription set with THRR, safety & activity barriers. Participant demonstrates ability to understand and report RPE using BORG scale, to self-measure pulse accurately, and to acknowledge the importance of the exercise prescription.  Exercise Prescription Goal: Starting with aerobic activity 30 plus minutes a day, 3 days per week for initial exercise prescription. Provide home exercise prescription and guidelines that participant acknowledges understanding prior to discharge.  Activity Barriers & Risk Stratification:   6 Minute Walk:     6 Minute Walk    Row Name 06/15/16 1153         6 Minute Walk   Phase Initial     Distance 1250 feet     Distance % Change 0 %     Walk Time 6 minutes     # of Rest Breaks 0     MPH 2.36     METS 2.81     RPE 11     Perceived Dyspnea  9     VO2 Peak 8.54     Symptoms No     Resting HR 55 bpm     Resting BP 170/62     Max Ex. HR 66 bpm     Max Ex. BP 168/60     2 Minute Post BP 166/64        Oxygen Initial Assessment:   Oxygen Re-Evaluation:   Oxygen Discharge (Final Oxygen Re-Evaluation):   Initial Exercise Prescription:     Initial Exercise Prescription - 06/15/16 1100      Date of Initial Exercise RX and Referring Provider   Date 06/15/16   Referring Provider Dr. Bronson Ing     Treadmill   MPH 2   Grade 0   Minutes 15   METs 2.5     NuStep   Level 2   SPM 17   Minutes 20   METs 1.9     Prescription Details   Frequency (times per week) 3   Duration Progress to 30 minutes of continuous aerobic without signs/symptoms of physical distress     Intensity   THRR 40-80% of Max Heartrate 90-107-125   Ratings of Perceived Exertion 11-13   Perceived Dyspnea 0-4     Progression    Progression Continue progressive overload as per policy without signs/symptoms or physical distress.     Resistance Training   Training Prescription Yes   Weight 1  Reps 10-15      Perform Capillary Blood Glucose checks as needed.  Exercise Prescription Changes:   Exercise Comments:   Exercise Goals and Review:      Exercise Goals    Row Name 06/15/16 1257             Exercise Goals   Increase Physical Activity Yes       Intervention Provide advice, education, support and counseling about physical activity/exercise needs.;Develop an individualized exercise prescription for aerobic and resistive training based on initial evaluation findings, risk stratification, comorbidities and participant's personal goals.       Expected Outcomes Achievement of increased cardiorespiratory fitness and enhanced flexibility, muscular endurance and strength shown through measurements of functional capacity and personal statement of participant.       Increase Strength and Stamina Yes       Intervention Provide advice, education, support and counseling about physical activity/exercise needs.;Develop an individualized exercise prescription for aerobic and resistive training based on initial evaluation findings, risk stratification, comorbidities and participant's personal goals.       Expected Outcomes Achievement of increased cardiorespiratory fitness and enhanced flexibility, muscular endurance and strength shown through measurements of functional capacity and personal statement of participant.          Exercise Goals Re-Evaluation :    Discharge Exercise Prescription (Final Exercise Prescription Changes):   Nutrition:  Target Goals: Understanding of nutrition guidelines, daily intake of sodium 1500mg , cholesterol 200mg , calories 30% from fat and 7% or less from saturated fats, daily to have 5 or more servings of fruits and vegetables.  Biometrics:     Pre Biometrics - 06/15/16  1155      Pre Biometrics   Height 5\' 9"  (1.753 m)   Weight 166 lb 7.2 oz (75.5 kg)   Waist Circumference 34 inches   Hip Circumference 37 inches   Waist to Hip Ratio 0.92 %   BMI (Calculated) 24.6   Triceps Skinfold 13 mm   % Body Fat 23.2 %   Grip Strength 59.13 kg   Flexibility 0 in  Test was not available   Single Leg Stand 3 seconds       Nutrition Therapy Plan and Nutrition Goals:   Nutrition Discharge: Rate Your Plate Scores:   Nutrition Goals Re-Evaluation:   Nutrition Goals Discharge (Final Nutrition Goals Re-Evaluation):   Psychosocial: Target Goals: Acknowledge presence or absence of significant depression and/or stress, maximize coping skills, provide positive support system. Participant is able to verbalize types and ability to use techniques and skills needed for reducing stress and depression.  Initial Review & Psychosocial Screening:     Initial Psych Review & Screening - 06/15/16 1300      Initial Review   Current issues with None Identified     Family Dynamics   Good Support System? Yes     Barriers   Psychosocial barriers to participate in program There are no identifiable barriers or psychosocial needs.     Screening Interventions   Interventions Encouraged to exercise      Quality of Life Scores:     Quality of Life - 06/15/16 1156      Quality of Life Scores   Health/Function Pre 22.57 %   Socioeconomic Pre 24.92 %   Psych/Spiritual Pre 25.93 %   Family Pre 28.3 %   GLOBAL Pre 24.58 %      PHQ-9: Recent Review Flowsheet Data    Depression screen [facility] 2/9 06/15/2016   Decreased Interest  0   Down, Depressed, Hopeless 0   PHQ - 2 Score 0   Altered sleeping 0   Tired, decreased energy 1   Change in appetite 0   Feeling bad or failure about yourself  0   Trouble concentrating 0   Moving slowly or fidgety/restless 0   Suicidal thoughts 0   PHQ-9 Score 1   Difficult doing work/chores Not difficult at all      Interpretation of Total Score  Total Score Depression Severity:  1-4 = Minimal depression, 5-9 = Mild depression, 10-14 = Moderate depression, 15-19 = Moderately severe depression, 20-27 = Severe depression   Psychosocial Evaluation and Intervention:     Psychosocial Evaluation - 06/15/16 1300      Psychosocial Evaluation & Interventions   Interventions Encouraged to exercise with the program and follow exercise prescription   Continue Psychosocial Services  No Follow up required      Psychosocial Re-Evaluation:   Psychosocial Discharge (Final Psychosocial Re-Evaluation):   Vocational Rehabilitation: Provide vocational rehab assistance to qualifying candidates.   Vocational Rehab Evaluation & Intervention:     Vocational Rehab - 06/15/16 1254      Initial Vocational Rehab Evaluation & Intervention   Assessment shows need for Vocational Rehabilitation No      Education: Education Goals: Education classes will be provided on a weekly basis, covering required topics. Participant will state understanding/return demonstration of topics presented.  Learning Barriers/Preferences:     Learning Barriers/Preferences - 06/15/16 1251      Learning Barriers/Preferences   Learning Barriers None   Learning Preferences Computer/Internet;Written Material;Pictoral      Education Topics: Hypertension, Hypertension Reduction -Define heart disease and high blood pressure. Discus how high blood pressure affects the body and ways to reduce high blood pressure.   Exercise and Your Heart -Discuss why it is important to exercise, the FITT principles of exercise, normal and abnormal responses to exercise, and how to exercise safely.   Angina -Discuss definition of angina, causes of angina, treatment of angina, and how to decrease risk of having angina.   Cardiac Medications -Review what the following cardiac medications are used for, how they affect the body, and side effects  that may occur when taking the medications.  Medications include Aspirin, Beta blockers, calcium channel blockers, ACE Inhibitors, angiotensin receptor blockers, diuretics, digoxin, and antihyperlipidemics.   Congestive Heart Failure -Discuss the definition of CHF, how to live with CHF, the signs and symptoms of CHF, and how keep track of weight and sodium intake.   Heart Disease and Intimacy -Discus the effect sexual activity has on the heart, how changes occur during intimacy as we age, and safety during sexual activity.   Smoking Cessation / COPD -Discuss different methods to quit smoking, the health benefits of quitting smoking, and the definition of COPD.   Nutrition I: Fats -Discuss the types of cholesterol, what cholesterol does to the heart, and how cholesterol levels can be controlled.   Nutrition II: Labels -Discuss the different components of food labels and how to read food label   Heart Parts and Heart Disease -Discuss the anatomy of the heart, the pathway of blood circulation through the heart, and these are affected by heart disease.   Stress I: Signs and Symptoms -Discuss the causes of stress, how stress may lead to anxiety and depression, and ways to limit stress.   Stress II: Relaxation -Discuss different types of relaxation techniques to limit stress.   Warning Signs of Stroke / TIA -  Discuss definition of a stroke, what the signs and symptoms are of a stroke, and how to identify when someone is having stroke.   Knowledge Questionnaire Score:     Knowledge Questionnaire Score - 06/15/16 1254      Knowledge Questionnaire Score   Pre Score 19/24      Core Components/Risk Factors/Patient Goals at Admission:     Personal Goals and Risk Factors at Admission - 06/15/16 1257      Core Components/Risk Factors/Patient Goals on Admission    Weight Management Weight Maintenance   Improve shortness of breath with ADL's Yes   Intervention Provide  education, individualized exercise plan and daily activity instruction to help decrease symptoms of SOB with activities of daily living.   Expected Outcomes Short Term: Achieves a reduction of symptoms when performing activities of daily living.   Personal Goal Other Yes   Personal Goal Get stonger and increase endurance.    Intervention Attend 3 x week in [facility] and supplement with 2 x week exercise at home.    Expected Outcomes Achieve personal goals.       Core Components/Risk Factors/Patient Goals Review:      Goals and Risk Factor Review    Row Name 06/15/16 1259             Core Components/Risk Factors/Patient Goals Review   Personal Goals Review Diabetes;Improve shortness of breath with ADL's          Core Components/Risk Factors/Patient Goals at Discharge (Final Review):      Goals and Risk Factor Review - 06/15/16 1259      Core Components/Risk Factors/Patient Goals Review   Personal Goals Review Diabetes;Improve shortness of breath with ADL's      ITP Comments:     ITP Comments    Row Name 06/15/16 1255           ITP Comments Mr. Krysiak is a pleasant 65 yr. old male. He has had a recent (R) kidney transplant and prostate removed. He is doing well.           Comments: Patient arrived for 1st visit/orientation/education at 10:00. Patient was referred to CR by Dr. Bronson Ing due to CABGx3 (Z95.1). During orientation advised patient on arrival and appointment times what to wear, what to do before, during and after exercise. Reviewed attendance and class policy. Talked about inclement weather and class consultation policy. Pt is scheduled to return Cardiac Rehab on 06/21/16 at 0930. Pt was advised to come to class 15 minutes before class starts. Patient was also given instructions on meeting with the dietician and attending the Family Structure classes. Pt is eager to get started. Patient participated in warm-up stretches followed by light weights and resistance bands.  Patient was then able to complete 6 minute walk test. Patient c/o being a little light headed after walk. Went away after 2 minute rest. Patient was measured for the equipment. Discussed equipment safety with patient. Took patient pre-anthropometric measurements. Patient finished visit at 12:30.

## 2016-06-15 NOTE — Progress Notes (Signed)
Cardiac/Pulmonary Rehab Medication Review by a Pharmacist  Does the patient  feel that his/her medications are working for him/her?  yes  Has the patient been experiencing any side effects to the medications prescribed?  Yes.  He has some bruising from the aspirin  Does the patient measure his/her own blood pressure or blood glucose at home?  yes   Does the patient have any problems obtaining medications due to transportation or finances?   no  Understanding of regimen: excellent Understanding of indications: excellent Potential of compliance: excellent  Pharmacist comments: He is very compliant with his medications.    Corey Riley 06/15/2016 11:47 AM

## 2016-06-21 ENCOUNTER — Encounter (HOSPITAL_COMMUNITY)
Admission: RE | Admit: 2016-06-21 | Discharge: 2016-06-21 | Disposition: A | Discharge status: Home / Self Care | Payer: Medicare Other | Source: Ambulatory Visit | Attending: Cardiovascular Disease | Admitting: Cardiovascular Disease

## 2016-06-21 DIAGNOSIS — Z48812 Encounter for surgical aftercare following surgery on the circulatory system: Secondary | ICD-10-CM | POA: Diagnosis not present

## 2016-06-21 DIAGNOSIS — Z9889 Other specified postprocedural states: Secondary | ICD-10-CM | POA: Diagnosis not present

## 2016-06-21 DIAGNOSIS — Z951 Presence of aortocoronary bypass graft: Secondary | ICD-10-CM

## 2016-06-21 NOTE — Progress Notes (Signed)
Daily Session Note  Patient Details  Name: Corey Riley MRN: [id number] Date of Birth: 11-09-1935 Referring Provider:     [name] from 06/15/2016 in [place]  Referring Provider  Dr. Bronson Ing      Encounter Date: 06/21/2016  Check In:     Session Check In - 06/21/16 0930      Check-In   Location [facility]   Staff Present Russella Dar, MS, EP, [facility], Exercise Physiologist;Gregory Luther Parody, BS, EP, Exercise Physiologist   Supervising physician immediately available to respond to emergencies See telemetry face sheet for immediately available MD   Medication changes reported     No   Fall or balance concerns reported    Yes   Tobacco Cessation No Change   Warm-up and Cool-down Performed as group-led instruction   Resistance Training Performed Yes   VAD Patient? No     Pain Assessment   Currently in Pain? No/denies   Pain Score 0-No pain   Multiple Pain Sites No      Capillary Blood Glucose: No results found for this or any previous visit (from the past 24 hour(s)).    History  Smoking Status  . Former Smoker  . Packs/day: 1.50  . Years: 11.00  . Types: Cigarettes  . Start date: 08/14/1955  . Quit date: 12/15/1966  Smokeless Tobacco  . Never Used    Goals Met:  Independence with exercise equipment Exercise tolerated well No report of cardiac concerns or symptoms Strength training completed today  Goals Unmet:  Not Applicable  Comments: Check out: 1030   Dr. Kate Sable is Medical Director for [facility].

## 2016-06-23 ENCOUNTER — Encounter (HOSPITAL_COMMUNITY)
Admission: RE | Admit: 2016-06-23 | Discharge: 2016-06-23 | Disposition: A | Discharge status: Home / Self Care | Payer: Medicare Other | Source: Ambulatory Visit | Attending: Cardiovascular Disease | Admitting: Cardiovascular Disease

## 2016-06-23 DIAGNOSIS — Z48812 Encounter for surgical aftercare following surgery on the circulatory system: Secondary | ICD-10-CM | POA: Diagnosis not present

## 2016-06-23 DIAGNOSIS — Z951 Presence of aortocoronary bypass graft: Secondary | ICD-10-CM | POA: Diagnosis not present

## 2016-06-23 DIAGNOSIS — Z9889 Other specified postprocedural states: Secondary | ICD-10-CM | POA: Diagnosis not present

## 2016-06-23 NOTE — Progress Notes (Signed)
Cardiac Individual Treatment Plan  Patient Details  Name: Corey Riley MRN: [id number] Date of Birth: Aug 29, 1935 Referring Provider:     [name] from 06/15/2016 in [place]  Referring Provider  Dr. Bronson Ing      Initial Encounter Date:    [name] from 06/15/2016 in [place]  Date  06/15/16  Referring Provider  Dr. Bronson Ing      Visit Diagnosis: S/P CABG x 3  Patient's Home Medications on Admission:  Current Outpatient Prescriptions:  .  amLODipine (NORVASC) 5 MG tablet, Take 1 tablet (5 mg total) by mouth daily. (Patient taking differently: Take 10 mg by mouth daily. ), Disp: 30 tablet, Rfl: 6 .  aspirin EC 81 MG tablet, Take 162 mg by mouth daily., Disp: , Rfl:  .  atorvastatin (LIPITOR) 20 MG tablet, Take 20 mg by mouth every Monday, Wednesday, and Friday., Disp: , Rfl:  .  dapsone 25 MG tablet, Take 2 tablets by mouth daily., Disp: , Rfl:  .  docusate sodium (COLACE) 100 MG capsule, Take 100 mg by mouth 2 (two) times daily as needed., Disp: , Rfl:  .  insulin glargine (LANTUS SOLOSTAR) 100 UNIT/ML injection, Inject 18 Units into the skin daily. , Disp: , Rfl:  .  levothyroxine (SYNTHROID, LEVOTHROID) 125 MCG tablet, Take 125 mcg by mouth daily before breakfast., Disp: , Rfl:  .  metoprolol (LOPRESSOR) 50 MG tablet, Take 1 tablet by mouth 2 (two) times daily. Has 100 mg tab currently that he cuts in half, Disp: , Rfl:  .  mycophenolate (MYFORTIC) 180 MG EC tablet, Take 180 mg by mouth 2 (two) times daily. , Disp: , Rfl:  .  nitroGLYCERIN (NITROSTAT) 0.4 MG SL tablet, Place 1 tablet (0.4 mg total) under the tongue every 5 (five) minutes x 3 doses as needed for chest pain. If no relief after 3 rd dose, proceed to the ED for an evaluation, Disp: 25 tablet, Rfl: 3 .  NOVOLOG FLEXPEN 100 UNIT/ML FlexPen, Inject 10 Units into the skin 3 (three) times daily. Plus sliding scale,  Disp: , Rfl: 1 .  omeprazole (PRILOSEC) 20 MG capsule, Take 1 capsule by mouth daily., Disp: , Rfl:  .  oxyCODONE (OXY IR/ROXICODONE) 5 MG immediate release tablet, Take 5 mg by mouth every 4 (four) hours as needed. for pain, Disp: , Rfl: 0 .  polyethylene glycol powder (GLYCOLAX/MIRALAX) powder, Take 17 g by mouth daily as needed., Disp: , Rfl:  .  predniSONE (DELTASONE) 5 MG tablet, Take 5 mg by mouth daily., Disp: , Rfl:  .  simvastatin (ZOCOR) 40 MG tablet, Take 1 tablet by mouth daily., Disp: , Rfl:  .  Tacrolimus 1 MG TB24, Take 2 mg by mouth daily. , Disp: , Rfl:  .  tamsulosin (FLOMAX) 0.4 MG CAPS capsule, Take 1 capsule by mouth at bedtime., Disp: , Rfl: 5 No current facility-administered medications for this encounter.   Facility-Administered Medications Ordered in Other Encounters:  .  cyclopentolate-phenylephrine (CYCLOMYDRYL) 0.2-1 % ophthalmic solution 1 drop, 1 drop, Right Eye, Once, Williams Che, MD .  ketorolac (ACULAR) 0.5 % ophthalmic solution 1 drop, 1 drop, Right Eye, Once, Williams Che, MD  Past Medical History: Past Medical History:  Diagnosis Date  . Anemia   . Anginal pain ([place])    "a little."   . Aortic stenosis    Mild  . Chronic kidney disease    pt  states 30% kidney function started dialysis 04/23/14 T/Th/Sa  . Constipation   . Coronary atherosclerosis of native coronary artery    DES LAD 8/03  . Essential hypertension, benign   . Heart murmur    "faint"  . Hypothyroidism   . Mixed hyperlipidemia   . Pneumonia   . Shortness of breath    with exertion  . Type 2 diabetes mellitus ([facility])     Tobacco Use: History  Smoking Status  . Former Smoker  . Packs/day: 1.50  . Years: 11.00  . Types: Cigarettes  . Start date: 08/14/1955  . Quit date: 12/15/1966  Smokeless Tobacco  . Never Used    Labs: Recent Review Flowsheet Data    There is no flowsheet data to display.      Capillary Blood Glucose: Lab Results  Component Value Date    GLUCAP 128 (H) 05/08/2014   GLUCAP 151 (H) 04/22/2014   GLUCAP 136 (H) 04/22/2014   GLUCAP 146 (H) 04/22/2014   GLUCAP 147 (H) 04/17/2012     Exercise Target Goals:    Exercise Program Goal: Individual exercise prescription set with THRR, safety & activity barriers. Participant demonstrates ability to understand and report RPE using BORG scale, to self-measure pulse accurately, and to acknowledge the importance of the exercise prescription.  Exercise Prescription Goal: Starting with aerobic activity 30 plus minutes a day, 3 days per week for initial exercise prescription. Provide home exercise prescription and guidelines that participant acknowledges understanding prior to discharge.  Activity Barriers & Risk Stratification:   6 Minute Walk:     6 Minute Walk    Row Name 06/15/16 1153         6 Minute Walk   Phase Initial     Distance 1250 feet     Distance % Change 0 %     Walk Time 6 minutes     # of Rest Breaks 0     MPH 2.36     METS 2.81     RPE 11     Perceived Dyspnea  9     VO2 Peak 8.54     Symptoms No     Resting HR 55 bpm     Resting BP 170/62     Max Ex. HR 66 bpm     Max Ex. BP 168/60     2 Minute Post BP 166/64        Oxygen Initial Assessment:   Oxygen Re-Evaluation:   Oxygen Discharge (Final Oxygen Re-Evaluation):   Initial Exercise Prescription:     Initial Exercise Prescription - 06/15/16 1100      Date of Initial Exercise RX and Referring Provider   Date 06/15/16   Referring Provider Dr. Bronson Ing     Treadmill   MPH 2   Grade 0   Minutes 15   METs 2.5     NuStep   Level 2   SPM 17   Minutes 20   METs 1.9     Prescription Details   Frequency (times per week) 3   Duration Progress to 30 minutes of continuous aerobic without signs/symptoms of physical distress     Intensity   THRR 40-80% of Max Heartrate 90-107-125   Ratings of Perceived Exertion 11-13   Perceived Dyspnea 0-4     Progression   Progression  Continue progressive overload as per policy without signs/symptoms or physical distress.     Resistance Training   Training Prescription Yes   Weight 1  Reps 10-15      Perform Capillary Blood Glucose checks as needed.  Exercise Prescription Changes:      Exercise Prescription Changes    Row Name 06/21/16 1400             Response to Exercise   Blood Pressure (Admit) 146/58       Blood Pressure (Exercise) 150/50       Blood Pressure (Exit) 122/62       Heart Rate (Admit) 53 bpm       Heart Rate (Exercise) 70 bpm       Heart Rate (Exit) 62 bpm       Rating of Perceived Exertion (Exercise) 13       Duration Progress to 30 minutes of  aerobic without signs/symptoms of physical distress       Intensity THRR unchanged         Progression   Progression Continue to progress workloads to maintain intensity without signs/symptoms of physical distress.         Resistance Training   Training Prescription Yes       Weight 1       Reps 10-15         Treadmill   MPH 2       Grade 0       Minutes 15       METs 2.5         NuStep   Level 2       SPM 17       Minutes 20       METs 1.9         Home Exercise Plan   Plans to continue exercise at Home (comment)       Frequency Add 2 additional days to program exercise sessions.          Exercise Comments:      Exercise Comments    Row Name 06/21/16 1454           Exercise Comments Patient has just started CR and will be progressed in time.           Exercise Goals and Review:      Exercise Goals    Row Name 06/15/16 1257             Exercise Goals   Increase Physical Activity Yes       Intervention Provide advice, education, support and counseling about physical activity/exercise needs.;Develop an individualized exercise prescription for aerobic and resistive training based on initial evaluation findings, risk stratification, comorbidities and participant's personal goals.       Expected Outcomes  Achievement of increased cardiorespiratory fitness and enhanced flexibility, muscular endurance and strength shown through measurements of functional capacity and personal statement of participant.       Increase Strength and Stamina Yes       Intervention Provide advice, education, support and counseling about physical activity/exercise needs.;Develop an individualized exercise prescription for aerobic and resistive training based on initial evaluation findings, risk stratification, comorbidities and participant's personal goals.       Expected Outcomes Achievement of increased cardiorespiratory fitness and enhanced flexibility, muscular endurance and strength shown through measurements of functional capacity and personal statement of participant.          Exercise Goals Re-Evaluation :    Discharge Exercise Prescription (Final Exercise Prescription Changes):     Exercise Prescription Changes - 06/21/16 1400      Response to Exercise  Blood Pressure (Admit) 146/58   Blood Pressure (Exercise) 150/50   Blood Pressure (Exit) 122/62   Heart Rate (Admit) 53 bpm   Heart Rate (Exercise) 70 bpm   Heart Rate (Exit) 62 bpm   Rating of Perceived Exertion (Exercise) 13   Duration Progress to 30 minutes of  aerobic without signs/symptoms of physical distress   Intensity THRR unchanged     Progression   Progression Continue to progress workloads to maintain intensity without signs/symptoms of physical distress.     Resistance Training   Training Prescription Yes   Weight 1   Reps 10-15     Treadmill   MPH 2   Grade 0   Minutes 15   METs 2.5     NuStep   Level 2   SPM 17   Minutes 20   METs 1.9     Home Exercise Plan   Plans to continue exercise at Home (comment)   Frequency Add 2 additional days to program exercise sessions.      Nutrition:  Target Goals: Understanding of nutrition guidelines, daily intake of sodium 1500mg , cholesterol 200mg , calories 30% from fat and 7%  or less from saturated fats, daily to have 5 or more servings of fruits and vegetables.  Biometrics:     Pre Biometrics - 06/15/16 1155      Pre Biometrics   Height 5\' 9"  (1.753 m)   Weight 166 lb 7.2 oz (75.5 kg)   Waist Circumference 34 inches   Hip Circumference 37 inches   Waist to Hip Ratio 0.92 %   BMI (Calculated) 24.6   Triceps Skinfold 13 mm   % Body Fat 23.2 %   Grip Strength 59.13 kg   Flexibility 0 in  Test was not available   Single Leg Stand 3 seconds       Nutrition Therapy Plan and Nutrition Goals:   Nutrition Discharge: Rate Your Plate Scores:   Nutrition Goals Re-Evaluation:   Nutrition Goals Discharge (Final Nutrition Goals Re-Evaluation):   Psychosocial: Target Goals: Acknowledge presence or absence of significant depression and/or stress, maximize coping skills, provide positive support system. Participant is able to verbalize types and ability to use techniques and skills needed for reducing stress and depression.  Initial Review & Psychosocial Screening:     Initial Psych Review & Screening - 06/15/16 1300      Initial Review   Current issues with None Identified     Family Dynamics   Good Support System? Yes     Barriers   Psychosocial barriers to participate in program There are no identifiable barriers or psychosocial needs.     Screening Interventions   Interventions Encouraged to exercise      Quality of Life Scores:     Quality of Life - 06/15/16 1156      Quality of Life Scores   Health/Function Pre 22.57 %   Socioeconomic Pre 24.92 %   Psych/Spiritual Pre 25.93 %   Family Pre 28.3 %   GLOBAL Pre 24.58 %      PHQ-9: Recent Review Flowsheet Data    Depression screen [facility] 2/9 06/15/2016   Decreased Interest 0   Down, Depressed, Hopeless 0   PHQ - 2 Score 0   Altered sleeping 0   Tired, decreased energy 1   Change in appetite 0   Feeling bad or failure about yourself  0   Trouble concentrating 0   Moving  slowly or fidgety/restless 0   Suicidal thoughts  0   PHQ-9 Score 1   Difficult doing work/chores Not difficult at all     Interpretation of Total Score  Total Score Depression Severity:  1-4 = Minimal depression, 5-9 = Mild depression, 10-14 = Moderate depression, 15-19 = Moderately severe depression, 20-27 = Severe depression   Psychosocial Evaluation and Intervention:     Psychosocial Evaluation - 06/15/16 1300      Psychosocial Evaluation & Interventions   Interventions Encouraged to exercise with the program and follow exercise prescription   Continue Psychosocial Services  No Follow up required      Psychosocial Re-Evaluation:   Psychosocial Discharge (Final Psychosocial Re-Evaluation):   Vocational Rehabilitation: Provide vocational rehab assistance to qualifying candidates.   Vocational Rehab Evaluation & Intervention:     Vocational Rehab - 06/15/16 1254      Initial Vocational Rehab Evaluation & Intervention   Assessment shows need for Vocational Rehabilitation No      Education: Education Goals: Education classes will be provided on a weekly basis, covering required topics. Participant will state understanding/return demonstration of topics presented.  Learning Barriers/Preferences:     Learning Barriers/Preferences - 06/15/16 1251      Learning Barriers/Preferences   Learning Barriers None   Learning Preferences Computer/Internet;Written Material;Pictoral      Education Topics: Hypertension, Hypertension Reduction -Define heart disease and high blood pressure. Discus how high blood pressure affects the body and ways to reduce high blood pressure.   Exercise and Your Heart -Discuss why it is important to exercise, the FITT principles of exercise, normal and abnormal responses to exercise, and how to exercise safely.   Angina -Discuss definition of angina, causes of angina, treatment of angina, and how to decrease risk of having  angina.   Cardiac Medications -Review what the following cardiac medications are used for, how they affect the body, and side effects that may occur when taking the medications.  Medications include Aspirin, Beta blockers, calcium channel blockers, ACE Inhibitors, angiotensin receptor blockers, diuretics, digoxin, and antihyperlipidemics.   Congestive Heart Failure -Discuss the definition of CHF, how to live with CHF, the signs and symptoms of CHF, and how keep track of weight and sodium intake.   Heart Disease and Intimacy -Discus the effect sexual activity has on the heart, how changes occur during intimacy as we age, and safety during sexual activity.   Smoking Cessation / COPD -Discuss different methods to quit smoking, the health benefits of quitting smoking, and the definition of COPD.   Nutrition I: Fats -Discuss the types of cholesterol, what cholesterol does to the heart, and how cholesterol levels can be controlled.   Nutrition II: Labels -Discuss the different components of food labels and how to read food label   Heart Parts and Heart Disease -Discuss the anatomy of the heart, the pathway of blood circulation through the heart, and these are affected by heart disease.   Stress I: Signs and Symptoms -Discuss the causes of stress, how stress may lead to anxiety and depression, and ways to limit stress.   Stress II: Relaxation -Discuss different types of relaxation techniques to limit stress.   Warning Signs of Stroke / TIA -Discuss definition of a stroke, what the signs and symptoms are of a stroke, and how to identify when someone is having stroke.   Knowledge Questionnaire Score:     Knowledge Questionnaire Score - 06/15/16 1254      Knowledge Questionnaire Score   Pre Score 19/24      Core Components/Risk  Factors/Patient Goals at Admission:     Personal Goals and Risk Factors at Admission - 06/15/16 1257      Core Components/Risk Factors/Patient  Goals on Admission    Weight Management Weight Maintenance   Improve shortness of breath with ADL's Yes   Intervention Provide education, individualized exercise plan and daily activity instruction to help decrease symptoms of SOB with activities of daily living.   Expected Outcomes Short Term: Achieves a reduction of symptoms when performing activities of daily living.   Personal Goal Other Yes   Personal Goal Get stonger and increase endurance.    Intervention Attend 3 x week in CR and supplement with 2 x week exercise at home.    Expected Outcomes Achieve personal goals.       Core Components/Risk Factors/Patient Goals Review:      Goals and Risk Factor Review    Row Name 06/15/16 1259             Core Components/Risk Factors/Patient Goals Review   Personal Goals Review Diabetes;Improve shortness of breath with ADL's          Core Components/Risk Factors/Patient Goals at Discharge (Final Review):      Goals and Risk Factor Review - 06/15/16 1259      Core Components/Risk Factors/Patient Goals Review   Personal Goals Review Diabetes;Improve shortness of breath with ADL's      ITP Comments:     ITP Comments    Row Name 06/15/16 1255 06/23/16 0751         ITP Comments Corey Riley is a pleasant 71 yr. old male. He has had a recent (R) kidney transplant and prostate removed. He is doing well.  Patient new to program. He has compeleted 2 sessions. Will continue to monitor for progress.          Comments: ITP 30 Day REVIEW Patient new to program. He has compeleted 2 sessions. Will continue to monitor for progress.

## 2016-06-23 NOTE — Progress Notes (Signed)
Daily Session Note  Patient Details  Name: Corey Riley MRN: [id number] Date of Birth: 1935-08-09 Referring Provider:     [name] from 06/15/2016 in [place]  Referring Provider  Dr. Bronson Ing      Encounter Date: 06/23/2016  Check In:     Session Check In - 06/23/16 0930      Check-In   Location [facility]   Staff Present Suzanne Boron, BS, EP, Exercise Physiologist;Zahi Plaskett Wynetta Emery, RN, BSN   Supervising physician immediately available to respond to emergencies See telemetry face sheet for immediately available MD   Medication changes reported     No   Fall or balance concerns reported    No   Warm-up and Cool-down Performed as group-led instruction   Resistance Training Performed Yes   VAD Patient? No     Pain Assessment   Currently in Pain? No/denies   Pain Score 0-No pain   Multiple Pain Sites No      Capillary Blood Glucose: No results found for this or any previous visit (from the past 24 hour(s)).    History  Smoking Status  . Former Smoker  . Packs/day: 1.50  . Years: 11.00  . Types: Cigarettes  . Start date: 08/14/1955  . Quit date: 12/15/1966  Smokeless Tobacco  . Never Used    Goals Met:  Independence with exercise equipment Exercise tolerated well No report of cardiac concerns or symptoms Strength training completed today  Goals Unmet:  Not Applicable  Comments: Check out 1030.   Dr. Kate Sable is Medical Director for [facility] Cardiac and Pulmonary Rehab.

## 2016-06-25 ENCOUNTER — Encounter (HOSPITAL_COMMUNITY): Payer: Medicare Other

## 2016-06-25 DIAGNOSIS — Z940 Kidney transplant status: Secondary | ICD-10-CM | POA: Diagnosis not present

## 2016-06-25 DIAGNOSIS — Z79899 Other long term (current) drug therapy: Secondary | ICD-10-CM | POA: Diagnosis not present

## 2016-06-25 DIAGNOSIS — Z9079 Acquired absence of other genital organ(s): Secondary | ICD-10-CM | POA: Diagnosis not present

## 2016-06-25 DIAGNOSIS — R8299 Other abnormal findings in urine: Secondary | ICD-10-CM | POA: Diagnosis not present

## 2016-06-28 ENCOUNTER — Encounter (HOSPITAL_COMMUNITY): Payer: Medicare Other

## 2016-06-30 ENCOUNTER — Encounter (HOSPITAL_COMMUNITY)
Admission: RE | Admit: 2016-06-30 | Discharge: 2016-06-30 | Disposition: A | Discharge status: Home / Self Care | Payer: Medicare Other | Source: Ambulatory Visit | Attending: Cardiovascular Disease | Admitting: Cardiovascular Disease

## 2016-06-30 DIAGNOSIS — Z951 Presence of aortocoronary bypass graft: Secondary | ICD-10-CM | POA: Diagnosis not present

## 2016-06-30 DIAGNOSIS — Z9889 Other specified postprocedural states: Secondary | ICD-10-CM | POA: Diagnosis not present

## 2016-06-30 DIAGNOSIS — Z48812 Encounter for surgical aftercare following surgery on the circulatory system: Secondary | ICD-10-CM | POA: Diagnosis not present

## 2016-06-30 NOTE — Progress Notes (Signed)
Daily Session Note  Patient Details  Name: Corey Riley MRN: [id number] Date of Birth: 01-Jun-1935 Referring Provider:     [name] from 06/15/2016 in [place]  Referring Provider  Dr. Bronson Ing      Encounter Date: 06/30/2016  Check In:     Session Check In - 06/30/16 0930      Check-In   Location [facility]   Staff Present Russella Dar, MS, EP, [facility], Exercise Physiologist;Tyshae Stair Wynetta Emery, RN, BSN   Supervising physician immediately available to respond to emergencies See telemetry face sheet for immediately available MD   Medication changes reported     No   Fall or balance concerns reported    No   Warm-up and Cool-down Performed as group-led instruction   Resistance Training Performed Yes   VAD Patient? No     Pain Assessment   Currently in Pain? No/denies   Pain Score 0-No pain   Multiple Pain Sites No      Capillary Blood Glucose: No results found for this or any previous visit (from the past 24 hour(s)).    History  Smoking Status  . Former Smoker  . Packs/day: 1.50  . Years: 11.00  . Types: Cigarettes  . Start date: 08/14/1955  . Quit date: 12/15/1966  Smokeless Tobacco  . Never Used    Goals Met:  Independence with exercise equipment Exercise tolerated well No report of cardiac concerns or symptoms Strength training completed today  Goals Unmet:  Not Applicable  Comments: Check out 1030.   Dr. Kate Sable is Medical Director for [facility] Cardiac and Pulmonary Rehab.

## 2016-07-02 ENCOUNTER — Encounter (HOSPITAL_COMMUNITY)
Admission: RE | Admit: 2016-07-02 | Discharge: 2016-07-02 | Disposition: A | Discharge status: Home / Self Care | Payer: Medicare Other | Source: Ambulatory Visit | Attending: Cardiovascular Disease | Admitting: Cardiovascular Disease

## 2016-07-02 DIAGNOSIS — Z48812 Encounter for surgical aftercare following surgery on the circulatory system: Secondary | ICD-10-CM | POA: Diagnosis not present

## 2016-07-02 DIAGNOSIS — Z951 Presence of aortocoronary bypass graft: Secondary | ICD-10-CM | POA: Diagnosis not present

## 2016-07-02 DIAGNOSIS — Z9889 Other specified postprocedural states: Secondary | ICD-10-CM | POA: Diagnosis not present

## 2016-07-02 NOTE — Progress Notes (Signed)
Daily Session Note  Patient Details  Name: Corey Riley MRN: [id number] Date of Birth: 1936/01/19 Referring Provider:     [name] from 06/15/2016 in [place]  Referring Provider  Dr. Bronson Ing      Encounter Date: 07/02/2016  Check In:     Session Check In - 07/02/16 0930      Check-In   Location [facility]   Staff Present Aundra Dubin, RN, BSN;Gregory Luther Parody, BS, EP, Exercise Physiologist   Supervising physician immediately available to respond to emergencies See telemetry face sheet for immediately available MD   Medication changes reported     No   Fall or balance concerns reported    No   Warm-up and Cool-down Performed as group-led instruction   Resistance Training Performed Yes   VAD Patient? No     Pain Assessment   Currently in Pain? No/denies   Pain Score 0-No pain   Multiple Pain Sites No      Capillary Blood Glucose: No results found for this or any previous visit (from the past 24 hour(s)).    History  Smoking Status  . Former Smoker  . Packs/day: 1.50  . Years: 11.00  . Types: Cigarettes  . Start date: 08/14/1955  . Quit date: 12/15/1966  Smokeless Tobacco  . Never Used    Goals Met:  Independence with exercise equipment Exercise tolerated well No report of cardiac concerns or symptoms Strength training completed today  Goals Unmet:  Not Applicable  Comments: Check out 1030.   Dr. Kate Sable is Medical Director for [facility] Cardiac and Pulmonary Rehab.

## 2016-07-05 ENCOUNTER — Encounter (HOSPITAL_COMMUNITY): Payer: Medicare Other

## 2016-07-07 ENCOUNTER — Encounter (HOSPITAL_COMMUNITY)
Admission: RE | Admit: 2016-07-07 | Discharge: 2016-07-07 | Disposition: A | Discharge status: Home / Self Care | Payer: Medicare Other | Source: Ambulatory Visit | Attending: Cardiovascular Disease | Admitting: Cardiovascular Disease

## 2016-07-09 ENCOUNTER — Encounter (HOSPITAL_COMMUNITY): Payer: Medicare Other

## 2016-07-12 ENCOUNTER — Encounter (HOSPITAL_COMMUNITY)
Admission: RE | Admit: 2016-07-12 | Discharge: 2016-07-12 | Disposition: A | Discharge status: Home / Self Care | Payer: Medicare Other | Source: Ambulatory Visit | Attending: Cardiovascular Disease | Admitting: Cardiovascular Disease

## 2016-07-12 DIAGNOSIS — Z9889 Other specified postprocedural states: Secondary | ICD-10-CM | POA: Diagnosis not present

## 2016-07-12 DIAGNOSIS — Z951 Presence of aortocoronary bypass graft: Secondary | ICD-10-CM

## 2016-07-12 DIAGNOSIS — Z48812 Encounter for surgical aftercare following surgery on the circulatory system: Secondary | ICD-10-CM | POA: Diagnosis not present

## 2016-07-12 NOTE — Progress Notes (Signed)
Daily Session Note  Patient Details  Name: Corey Riley MRN: [id number] Date of Birth: [birth year] Referring Provider:     [name] from 06/15/2016 in [place]  Referring Provider  Dr. Bronson Ing      Encounter Date: 07/12/2016  Check In:     Session Check In - 07/12/16 0930      Check-In   Location [facility]   Staff Present Russella Dar, MS, EP, [facility], Exercise Physiologist;Shaun Zuccaro Wynetta Emery, RN, BSN;Gregory Cowan, BS, EP, Exercise Physiologist   Supervising physician immediately available to respond to emergencies See telemetry face sheet for immediately available MD   Medication changes reported     No   Fall or balance concerns reported    No   Tobacco Cessation No Change   Warm-up and Cool-down Performed as group-led instruction   Resistance Training Performed Yes   VAD Patient? No     Pain Assessment   Currently in Pain? No/denies   Pain Score 0-No pain   Multiple Pain Sites No      Capillary Blood Glucose: No results found for this or any previous visit (from the past 24 hour(s)).    History  Smoking Status  . Former Smoker  . Packs/day: 1.50  . Years: 11.00  . Types: Cigarettes  . Start date: 08/14/1955  . Quit date: 12/15/1966  Smokeless Tobacco  . Never Used    Goals Met:  Independence with exercise equipment Exercise tolerated well No report of cardiac concerns or symptoms Strength training completed today  Goals Unmet:  Not Applicable  Comments: Check out 1030.   Dr. Kate Sable is Medical Director for [facility] Cardiac and Pulmonary Rehab.

## 2016-07-13 DIAGNOSIS — N401 Enlarged prostate with lower urinary tract symptoms: Secondary | ICD-10-CM | POA: Diagnosis not present

## 2016-07-13 DIAGNOSIS — Z951 Presence of aortocoronary bypass graft: Secondary | ICD-10-CM | POA: Diagnosis not present

## 2016-07-13 DIAGNOSIS — N400 Enlarged prostate without lower urinary tract symptoms: Secondary | ICD-10-CM | POA: Diagnosis not present

## 2016-07-13 DIAGNOSIS — I10 Essential (primary) hypertension: Secondary | ICD-10-CM | POA: Diagnosis not present

## 2016-07-13 DIAGNOSIS — Z87440 Personal history of urinary (tract) infections: Secondary | ICD-10-CM | POA: Diagnosis not present

## 2016-07-13 DIAGNOSIS — I2510 Atherosclerotic heart disease of native coronary artery without angina pectoris: Secondary | ICD-10-CM | POA: Diagnosis not present

## 2016-07-13 DIAGNOSIS — D631 Anemia in chronic kidney disease: Secondary | ICD-10-CM | POA: Diagnosis not present

## 2016-07-13 DIAGNOSIS — E1122 Type 2 diabetes mellitus with diabetic chronic kidney disease: Secondary | ICD-10-CM | POA: Diagnosis not present

## 2016-07-13 DIAGNOSIS — Z792 Long term (current) use of antibiotics: Secondary | ICD-10-CM | POA: Diagnosis not present

## 2016-07-13 DIAGNOSIS — D649 Anemia, unspecified: Secondary | ICD-10-CM | POA: Diagnosis not present

## 2016-07-13 DIAGNOSIS — Z79890 Hormone replacement therapy (postmenopausal): Secondary | ICD-10-CM | POA: Diagnosis not present

## 2016-07-13 DIAGNOSIS — N186 End stage renal disease: Secondary | ICD-10-CM | POA: Diagnosis not present

## 2016-07-13 DIAGNOSIS — R1031 Right lower quadrant pain: Secondary | ICD-10-CM | POA: Diagnosis not present

## 2016-07-13 DIAGNOSIS — D8989 Other specified disorders involving the immune mechanism, not elsewhere classified: Secondary | ICD-10-CM | POA: Diagnosis not present

## 2016-07-13 DIAGNOSIS — Z940 Kidney transplant status: Secondary | ICD-10-CM | POA: Diagnosis not present

## 2016-07-13 DIAGNOSIS — E119 Type 2 diabetes mellitus without complications: Secondary | ICD-10-CM | POA: Diagnosis not present

## 2016-07-13 DIAGNOSIS — Z79899 Other long term (current) drug therapy: Secondary | ICD-10-CM | POA: Diagnosis not present

## 2016-07-13 DIAGNOSIS — Z992 Dependence on renal dialysis: Secondary | ICD-10-CM | POA: Diagnosis not present

## 2016-07-13 DIAGNOSIS — Z7982 Long term (current) use of aspirin: Secondary | ICD-10-CM | POA: Diagnosis not present

## 2016-07-13 DIAGNOSIS — I120 Hypertensive chronic kidney disease with stage 5 chronic kidney disease or end stage renal disease: Secondary | ICD-10-CM | POA: Diagnosis not present

## 2016-07-13 DIAGNOSIS — Z794 Long term (current) use of insulin: Secondary | ICD-10-CM | POA: Diagnosis not present

## 2016-07-13 DIAGNOSIS — E039 Hypothyroidism, unspecified: Secondary | ICD-10-CM | POA: Diagnosis not present

## 2016-07-13 DIAGNOSIS — Z4822 Encounter for aftercare following kidney transplant: Secondary | ICD-10-CM | POA: Diagnosis not present

## 2016-07-13 DIAGNOSIS — Z885 Allergy status to narcotic agent status: Secondary | ICD-10-CM | POA: Diagnosis not present

## 2016-07-13 DIAGNOSIS — D899 Disorder involving the immune mechanism, unspecified: Secondary | ICD-10-CM | POA: Diagnosis not present

## 2016-07-13 DIAGNOSIS — Z882 Allergy status to sulfonamides status: Secondary | ICD-10-CM | POA: Diagnosis not present

## 2016-07-13 DIAGNOSIS — E785 Hyperlipidemia, unspecified: Secondary | ICD-10-CM | POA: Diagnosis not present

## 2016-07-14 ENCOUNTER — Encounter (HOSPITAL_COMMUNITY)
Admission: RE | Admit: 2016-07-14 | Discharge: 2016-07-14 | Disposition: A | Discharge status: Home / Self Care | Payer: Medicare Other | Source: Ambulatory Visit | Attending: Cardiovascular Disease | Admitting: Cardiovascular Disease

## 2016-07-14 DIAGNOSIS — Z951 Presence of aortocoronary bypass graft: Secondary | ICD-10-CM

## 2016-07-14 DIAGNOSIS — Z9889 Other specified postprocedural states: Secondary | ICD-10-CM | POA: Diagnosis not present

## 2016-07-14 DIAGNOSIS — Z48812 Encounter for surgical aftercare following surgery on the circulatory system: Secondary | ICD-10-CM | POA: Diagnosis not present

## 2016-07-14 NOTE — Progress Notes (Signed)
Daily Session Note  Patient Details  Name: AMBROSIO REUTER MRN: [id number] Date of Birth: Feb 06, 1935 Referring Provider:     [name] from 06/15/2016 in [place]  Referring Provider  Dr. Bronson Ing      Encounter Date: 07/14/2016  Check In:     Session Check In - 07/14/16 0930      Check-In   Location [facility]   Staff Present Russella Dar, MS, EP, [facility], Exercise Physiologist;Natash Berman Wynetta Emery, RN, BSN;Gregory Cowan, BS, EP, Exercise Physiologist   Supervising physician immediately available to respond to emergencies See telemetry face sheet for immediately available MD   Medication changes reported     No   Fall or balance concerns reported    No   Tobacco Cessation No Change   Warm-up and Cool-down Performed as group-led instruction   Resistance Training Performed Yes   VAD Patient? No     Pain Assessment   Currently in Pain? No/denies   Pain Score 0-No pain   Multiple Pain Sites No      Capillary Blood Glucose: No results found for this or any previous visit (from the past 24 hour(s)).    History  Smoking Status  . Former Smoker  . Packs/day: 1.50  . Years: 11.00  . Types: Cigarettes  . Start date: 08/14/1955  . Quit date: 12/15/1966  Smokeless Tobacco  . Never Used    Goals Met:  Independence with exercise equipment Exercise tolerated well No report of cardiac concerns or symptoms Strength training completed today  Goals Unmet:  Not Applicable  Comments: Check out 1030.   Dr. Kate Sable is Medical Director for [facility] Cardiac and Pulmonary Rehab.

## 2016-07-16 ENCOUNTER — Encounter (HOSPITAL_COMMUNITY)
Admission: RE | Admit: 2016-07-16 | Discharge: 2016-07-16 | Disposition: A | Discharge status: Home / Self Care | Payer: Medicare Other | Source: Ambulatory Visit | Attending: Cardiovascular Disease | Admitting: Cardiovascular Disease

## 2016-07-16 DIAGNOSIS — Z951 Presence of aortocoronary bypass graft: Secondary | ICD-10-CM

## 2016-07-16 DIAGNOSIS — Z48812 Encounter for surgical aftercare following surgery on the circulatory system: Secondary | ICD-10-CM | POA: Diagnosis not present

## 2016-07-16 DIAGNOSIS — Z9889 Other specified postprocedural states: Secondary | ICD-10-CM | POA: Diagnosis not present

## 2016-07-16 NOTE — Progress Notes (Signed)
Daily Session Note  Patient Details  Name: Corey Riley MRN: [id number] Date of Birth: 12-Jan-1936 Referring Provider:     [name] from 06/15/2016 in [place]  Referring Provider  Dr. Bronson Ing      Encounter Date: 07/16/2016  Check In:     Session Check In - 07/16/16 0930      Check-In   Location [facility]   Staff Present Aundra Dubin, RN, BSN;Gregory Luther Parody, BS, EP, Exercise Physiologist   Supervising physician immediately available to respond to emergencies See telemetry face sheet for immediately available MD   Medication changes reported     No   Fall or balance concerns reported    No   Tobacco Cessation No Change   Warm-up and Cool-down Performed as group-led instruction   Resistance Training Performed Yes   VAD Patient? No     Pain Assessment   Currently in Pain? No/denies   Pain Score 0-No pain   Multiple Pain Sites No      Capillary Blood Glucose: No results found for this or any previous visit (from the past 24 hour(s)).      Exercise Prescription Changes - 07/15/16 1500      Response to Exercise   Blood Pressure (Admit) 160/50   Blood Pressure (Exercise) 166/60   Blood Pressure (Exit) 146/50   Heart Rate (Admit) 48 bpm   Heart Rate (Exercise) 69 bpm   Heart Rate (Exit) 60 bpm   Rating of Perceived Exertion (Exercise) 12   Duration Progress to 30 minutes of  aerobic without signs/symptoms of physical distress   Intensity THRR unchanged     Progression   Progression Continue to progress workloads to maintain intensity without signs/symptoms of physical distress.     Resistance Training   Training Prescription Yes   Weight 1   Reps 10-15     Treadmill   MPH 2.1   Grade 0   Minutes 15   METs 2.6     NuStep   Level 2   SPM 18   Minutes 20   METs 3.5     Home Exercise Plan   Plans to continue exercise at Home (comment)   Frequency Add 2 additional days to program  exercise sessions.      History  Smoking Status  . Former Smoker  . Packs/day: 1.50  . Years: 11.00  . Types: Cigarettes  . Start date: 08/14/1955  . Quit date: 12/15/1966  Smokeless Tobacco  . Never Used    Goals Met:  Independence with exercise equipment Exercise tolerated well No report of cardiac concerns or symptoms Strength training completed today  Goals Unmet:  Not Applicable  Comments: Check out 1030.   Dr. Kate Sable is Medical Director for [facility] Cardiac and Pulmonary Rehab.

## 2016-07-19 ENCOUNTER — Encounter (HOSPITAL_COMMUNITY)
Admission: RE | Admit: 2016-07-19 | Discharge: 2016-07-19 | Disposition: A | Discharge status: Home / Self Care | Payer: Medicare Other | Source: Ambulatory Visit | Attending: Cardiovascular Disease | Admitting: Cardiovascular Disease

## 2016-07-19 DIAGNOSIS — Z951 Presence of aortocoronary bypass graft: Secondary | ICD-10-CM

## 2016-07-19 NOTE — Progress Notes (Signed)
Daily Session Note  Patient Details  Name: Corey Riley MRN: [id number] Date of Birth: Jul 19, 1935 Referring Provider:     [name] from 06/15/2016 in [place]  Referring Provider  Dr. Bronson Ing      Encounter Date: 07/19/2016  Check In:     Session Check In - 07/19/16 0930      Check-In   Location [facility]   Staff Present Russella Dar, MS, EP, [facility], Exercise Physiologist;Jamario Colina Wynetta Emery, RN, BSN;Gregory Cowan, BS, EP, Exercise Physiologist   Supervising physician immediately available to respond to emergencies See telemetry face sheet for immediately available MD   Medication changes reported     No   Fall or balance concerns reported    No   Tobacco Cessation No Change   Warm-up and Cool-down Performed as group-led instruction   Resistance Training Performed Yes   VAD Patient? No     Pain Assessment   Currently in Pain? No/denies   Pain Score 0-No pain   Multiple Pain Sites No      Capillary Blood Glucose: No results found for this or any previous visit (from the past 24 hour(s)).    History  Smoking Status  . Former Smoker  . Packs/day: 1.50  . Years: 11.00  . Types: Cigarettes  . Start date: 08/14/1955  . Quit date: 12/15/1966  Smokeless Tobacco  . Never Used    Goals Met:  Independence with exercise equipment Exercise tolerated well No report of cardiac concerns or symptoms Strength training completed today  Goals Unmet:  Not Applicable  Comments: Check out 1030.   Dr. Kate Sable is Medical Director for [facility] Cardiac and Pulmonary Rehab.

## 2016-07-21 ENCOUNTER — Encounter (HOSPITAL_COMMUNITY): Payer: Medicare Other

## 2016-07-21 NOTE — Progress Notes (Signed)
Cardiac Individual Treatment Plan  Patient Details  Name: Corey Riley MRN: [id number] Date of Birth: Aug 29, 1935 Referring Provider:     [name] from 06/15/2016 in [place]  Referring Provider  Dr. Bronson Ing      Initial Encounter Date:    [name] from 06/15/2016 in [place]  Date  06/15/16  Referring Provider  Dr. Bronson Ing      Visit Diagnosis: S/P CABG x 3  Patient's Home Medications on Admission:  Current Outpatient Prescriptions:  .  amLODipine (NORVASC) 5 MG tablet, Take 1 tablet (5 mg total) by mouth daily. (Patient taking differently: Take 10 mg by mouth daily. ), Disp: 30 tablet, Rfl: 6 .  aspirin EC 81 MG tablet, Take 162 mg by mouth daily., Disp: , Rfl:  .  atorvastatin (LIPITOR) 20 MG tablet, Take 20 mg by mouth every Monday, Wednesday, and Friday., Disp: , Rfl:  .  dapsone 25 MG tablet, Take 2 tablets by mouth daily., Disp: , Rfl:  .  docusate sodium (COLACE) 100 MG capsule, Take 100 mg by mouth 2 (two) times daily as needed., Disp: , Rfl:  .  insulin glargine (LANTUS SOLOSTAR) 100 UNIT/ML injection, Inject 18 Units into the skin daily. , Disp: , Rfl:  .  levothyroxine (SYNTHROID, LEVOTHROID) 125 MCG tablet, Take 125 mcg by mouth daily before breakfast., Disp: , Rfl:  .  metoprolol (LOPRESSOR) 50 MG tablet, Take 1 tablet by mouth 2 (two) times daily. Has 100 mg tab currently that he cuts in half, Disp: , Rfl:  .  mycophenolate (MYFORTIC) 180 MG EC tablet, Take 180 mg by mouth 2 (two) times daily. , Disp: , Rfl:  .  nitroGLYCERIN (NITROSTAT) 0.4 MG SL tablet, Place 1 tablet (0.4 mg total) under the tongue every 5 (five) minutes x 3 doses as needed for chest pain. If no relief after 3 rd dose, proceed to the ED for an evaluation, Disp: 25 tablet, Rfl: 3 .  NOVOLOG FLEXPEN 100 UNIT/ML FlexPen, Inject 10 Units into the skin 3 (three) times daily. Plus sliding scale,  Disp: , Rfl: 1 .  omeprazole (PRILOSEC) 20 MG capsule, Take 1 capsule by mouth daily., Disp: , Rfl:  .  oxyCODONE (OXY IR/ROXICODONE) 5 MG immediate release tablet, Take 5 mg by mouth every 4 (four) hours as needed. for pain, Disp: , Rfl: 0 .  polyethylene glycol powder (GLYCOLAX/MIRALAX) powder, Take 17 g by mouth daily as needed., Disp: , Rfl:  .  predniSONE (DELTASONE) 5 MG tablet, Take 5 mg by mouth daily., Disp: , Rfl:  .  simvastatin (ZOCOR) 40 MG tablet, Take 1 tablet by mouth daily., Disp: , Rfl:  .  Tacrolimus 1 MG TB24, Take 2 mg by mouth daily. , Disp: , Rfl:  .  tamsulosin (FLOMAX) 0.4 MG CAPS capsule, Take 1 capsule by mouth at bedtime., Disp: , Rfl: 5 No current facility-administered medications for this encounter.   Facility-Administered Medications Ordered in Other Encounters:  .  cyclopentolate-phenylephrine (CYCLOMYDRYL) 0.2-1 % ophthalmic solution 1 drop, 1 drop, Right Eye, Once, Williams Che, MD .  ketorolac (ACULAR) 0.5 % ophthalmic solution 1 drop, 1 drop, Right Eye, Once, Williams Che, MD  Past Medical History: Past Medical History:  Diagnosis Date  . Anemia   . Anginal pain ([place])    "a little."   . Aortic stenosis    Mild  . Chronic kidney disease    pt  states 30% kidney function started dialysis 04/23/14 T/Th/Sa  . Constipation   . Coronary atherosclerosis of native coronary artery    DES LAD 8/03  . Essential hypertension, benign   . Heart murmur    "faint"  . Hypothyroidism   . Mixed hyperlipidemia   . Pneumonia   . Shortness of breath    with exertion  . Type 2 diabetes mellitus ([facility])     Tobacco Use: History  Smoking Status  . Former Smoker  . Packs/day: 1.50  . Years: 11.00  . Types: Cigarettes  . Start date: 08/14/1955  . Quit date: 12/15/1966  Smokeless Tobacco  . Never Used    Labs: Recent Review Flowsheet Data    There is no flowsheet data to display.      Capillary Blood Glucose: Lab Results  Component Value Date    GLUCAP 128 (H) 05/08/2014   GLUCAP 151 (H) 04/22/2014   GLUCAP 136 (H) 04/22/2014   GLUCAP 146 (H) 04/22/2014   GLUCAP 147 (H) 04/17/2012     Exercise Target Goals:    Exercise Program Goal: Individual exercise prescription set with THRR, safety & activity barriers. Participant demonstrates ability to understand and report RPE using BORG scale, to self-measure pulse accurately, and to acknowledge the importance of the exercise prescription.  Exercise Prescription Goal: Starting with aerobic activity 30 plus minutes a day, 3 days per week for initial exercise prescription. Provide home exercise prescription and guidelines that participant acknowledges understanding prior to discharge.  Activity Barriers & Risk Stratification:   6 Minute Walk:     6 Minute Walk    Row Name 06/15/16 1153         6 Minute Walk   Phase Initial     Distance 1250 feet     Distance % Change 0 %     Walk Time 6 minutes     # of Rest Breaks 0     MPH 2.36     METS 2.81     RPE 11     Perceived Dyspnea  9     VO2 Peak 8.54     Symptoms No     Resting HR 55 bpm     Resting BP 170/62     Max Ex. HR 66 bpm     Max Ex. BP 168/60     2 Minute Post BP 166/64        Oxygen Initial Assessment:   Oxygen Re-Evaluation:   Oxygen Discharge (Final Oxygen Re-Evaluation):   Initial Exercise Prescription:     Initial Exercise Prescription - 06/15/16 1100      Date of Initial Exercise RX and Referring Provider   Date 06/15/16   Referring Provider Dr. Bronson Ing     Treadmill   MPH 2   Grade 0   Minutes 15   METs 2.5     NuStep   Level 2   SPM 17   Minutes 20   METs 1.9     Prescription Details   Frequency (times per week) 3   Duration Progress to 30 minutes of continuous aerobic without signs/symptoms of physical distress     Intensity   THRR 40-80% of Max Heartrate 90-107-125   Ratings of Perceived Exertion 11-13   Perceived Dyspnea 0-4     Progression   Progression  Continue progressive overload as per policy without signs/symptoms or physical distress.     Resistance Training   Training Prescription Yes   Weight 1  Reps 10-15      Perform Capillary Blood Glucose checks as needed.  Exercise Prescription Changes:      Exercise Prescription Changes    Row Name 06/21/16 1400 07/05/16 1400 07/15/16 1500         Response to Exercise   Blood Pressure (Admit) 146/58 160/50 160/50     Blood Pressure (Exercise) 150/50 150/50 166/60     Blood Pressure (Exit) 122/62 130/50 146/50     Heart Rate (Admit) 53 bpm 56 bpm 48 bpm     Heart Rate (Exercise) 70 bpm 67 bpm 69 bpm     Heart Rate (Exit) 62 bpm 61 bpm 60 bpm     Rating of Perceived Exertion (Exercise) 13 13 12      Duration Progress to 30 minutes of  aerobic without signs/symptoms of physical distress Progress to 30 minutes of  aerobic without signs/symptoms of physical distress Progress to 30 minutes of  aerobic without signs/symptoms of physical distress     Intensity THRR unchanged THRR unchanged THRR unchanged       Progression   Progression Continue to progress workloads to maintain intensity without signs/symptoms of physical distress. Continue to progress workloads to maintain intensity without signs/symptoms of physical distress. Continue to progress workloads to maintain intensity without signs/symptoms of physical distress.       Resistance Training   Training Prescription Yes Yes Yes     Weight 1 1 1      Reps 10-15 10-15 10-15       Treadmill   MPH 2 2 2.1     Grade 0 0 0     Minutes 15 15 15      METs 2.5 2.5 2.6       NuStep   Level 2 2 2      SPM 17 22 18      Minutes 20 20 20      METs 1.9 3.51 3.5       Home Exercise Plan   Plans to continue exercise at Home (comment) Home (comment) Home (comment)     Frequency Add 2 additional days to program exercise sessions. Add 2 additional days to program exercise sessions. Add 2 additional days to program exercise sessions.         Exercise Comments:      Exercise Comments    Row Name 06/21/16 1454 07/05/16 1432 07/15/16 1530       Exercise Comments Patient has just started CR and will be progressed in time.  Patient is doing well in CR. He has not increased in level but has increased in watts.  Patient is doing well in CR.         Exercise Goals and Review:      Exercise Goals    Row Name 06/15/16 1257             Exercise Goals   Increase Physical Activity Yes       Intervention Provide advice, education, support and counseling about physical activity/exercise needs.;Develop an individualized exercise prescription for aerobic and resistive training based on initial evaluation findings, risk stratification, comorbidities and participant's personal goals.       Expected Outcomes Achievement of increased cardiorespiratory fitness and enhanced flexibility, muscular endurance and strength shown through measurements of functional capacity and personal statement of participant.       Increase Strength and Stamina Yes       Intervention Provide advice, education, support and counseling about physical activity/exercise needs.;Develop an individualized exercise prescription  for aerobic and resistive training based on initial evaluation findings, risk stratification, comorbidities and participant's personal goals.       Expected Outcomes Achievement of increased cardiorespiratory fitness and enhanced flexibility, muscular endurance and strength shown through measurements of functional capacity and personal statement of participant.          Exercise Goals Re-Evaluation :     Exercise Goals Re-Evaluation    Row Name 07/21/16 1427             Exercise Goal Re-Evaluation   Exercise Goals Review Increase Physical Activity;Increase Strenth and Stamina       Comments Patient has completed 8 sessions with some progression. He says he feels stronger since starting the program. Will continue to monitor for  progress.        Expected Outcomes Patient will complete the program with increased strength, stamina, and activity.           Discharge Exercise Prescription (Final Exercise Prescription Changes):     Exercise Prescription Changes - 07/15/16 1500      Response to Exercise   Blood Pressure (Admit) 160/50   Blood Pressure (Exercise) 166/60   Blood Pressure (Exit) 146/50   Heart Rate (Admit) 48 bpm   Heart Rate (Exercise) 69 bpm   Heart Rate (Exit) 60 bpm   Rating of Perceived Exertion (Exercise) 12   Duration Progress to 30 minutes of  aerobic without signs/symptoms of physical distress   Intensity THRR unchanged     Progression   Progression Continue to progress workloads to maintain intensity without signs/symptoms of physical distress.     Resistance Training   Training Prescription Yes   Weight 1   Reps 10-15     Treadmill   MPH 2.1   Grade 0   Minutes 15   METs 2.6     NuStep   Level 2   SPM 18   Minutes 20   METs 3.5     Home Exercise Plan   Plans to continue exercise at Home (comment)   Frequency Add 2 additional days to program exercise sessions.      Nutrition:  Target Goals: Understanding of nutrition guidelines, daily intake of sodium 1500mg , cholesterol 200mg , calories 30% from fat and 7% or less from saturated fats, daily to have 5 or more servings of fruits and vegetables.  Biometrics:     Pre Biometrics - 06/15/16 1155      Pre Biometrics   Height 5\' 9"  (1.753 m)   Weight 166 lb 7.2 oz (75.5 kg)   Waist Circumference 34 inches   Hip Circumference 37 inches   Waist to Hip Ratio 0.92 %   BMI (Calculated) 24.6   Triceps Skinfold 13 mm   % Body Fat 23.2 %   Grip Strength 59.13 kg   Flexibility 0 in  Test was not available   Single Leg Stand 3 seconds       Nutrition Therapy Plan and Nutrition Goals:   Nutrition Discharge: Rate Your Plate Scores:   Nutrition Goals Re-Evaluation:   Nutrition Goals Discharge (Final Nutrition  Goals Re-Evaluation):   Psychosocial: Target Goals: Acknowledge presence or absence of significant depression and/or stress, maximize coping skills, provide positive support system. Participant is able to verbalize types and ability to use techniques and skills needed for reducing stress and depression.  Initial Review & Psychosocial Screening:     Initial Psych Review & Screening - 06/15/16 1300      Initial Review  Current issues with None Identified     Family Dynamics   Good Support System? Yes     Barriers   Psychosocial barriers to participate in program There are no identifiable barriers or psychosocial needs.     Screening Interventions   Interventions Encouraged to exercise      Quality of Life Scores:     Quality of Life - 06/15/16 1156      Quality of Life Scores   Health/Function Pre 22.57 %   Socioeconomic Pre 24.92 %   Psych/Spiritual Pre 25.93 %   Family Pre 28.3 %   GLOBAL Pre 24.58 %      PHQ-9: Recent Review Flowsheet Data    Depression screen [facility] 2/9 06/15/2016   Decreased Interest 0   Down, Depressed, Hopeless 0   PHQ - 2 Score 0   Altered sleeping 0   Tired, decreased energy 1   Change in appetite 0   Feeling bad or failure about yourself  0   Trouble concentrating 0   Moving slowly or fidgety/restless 0   Suicidal thoughts 0   PHQ-9 Score 1   Difficult doing work/chores Not difficult at all     Interpretation of Total Score  Total Score Depression Severity:  1-4 = Minimal depression, 5-9 = Mild depression, 10-14 = Moderate depression, 15-19 = Moderately severe depression, 20-27 = Severe depression   Psychosocial Evaluation and Intervention:     Psychosocial Evaluation - 06/15/16 1300      Psychosocial Evaluation & Interventions   Interventions Encouraged to exercise with the program and follow exercise prescription   Continue Psychosocial Services  No Follow up required      Psychosocial Re-Evaluation:     Psychosocial  Re-Evaluation    [place] Name 07/21/16 1429             Psychosocial Re-Evaluation   Current issues with None Identified       Expected Outcomes Patient will have no psychosocial issues identified at discharge.        Interventions Encouraged to attend Cardiac Rehabilitation for the exercise       Continue Psychosocial Services  No Follow up required          Psychosocial Discharge (Final Psychosocial Re-Evaluation):     Psychosocial Re-Evaluation - 07/21/16 1429      Psychosocial Re-Evaluation   Current issues with None Identified   Expected Outcomes Patient will have no psychosocial issues identified at discharge.    Interventions Encouraged to attend Cardiac Rehabilitation for the exercise   Continue Psychosocial Services  No Follow up required      Vocational Rehabilitation: Provide vocational rehab assistance to qualifying candidates.   Vocational Rehab Evaluation & Intervention:     Vocational Rehab - 06/15/16 1254      Initial Vocational Rehab Evaluation & Intervention   Assessment shows need for Vocational Rehabilitation No      Education: Education Goals: Education classes will be provided on a weekly basis, covering required topics. Participant will state understanding/return demonstration of topics presented.  Learning Barriers/Preferences:     Learning Barriers/Preferences - 06/15/16 1251      Learning Barriers/Preferences   Learning Barriers None   Learning Preferences Computer/Internet;Written Material;Pictoral      Education Topics: Hypertension, Hypertension Reduction -Define heart disease and high blood pressure. Discus how high blood pressure affects the body and ways to reduce high blood pressure.   Exercise and Your Heart -Discuss why it is important to exercise, the [facility]  principles of exercise, normal and abnormal responses to exercise, and how to exercise safely.   Angina -Discuss definition of angina, causes of angina, treatment of  angina, and how to decrease risk of having angina.   Cardiac Medications -Review what the following cardiac medications are used for, how they affect the body, and side effects that may occur when taking the medications.  Medications include Aspirin, Beta blockers, calcium channel blockers, ACE Inhibitors, angiotensin receptor blockers, diuretics, digoxin, and antihyperlipidemics.   CARDIAC REHAB PHASE II EXERCISE from 07/14/2016 in [place]  Date  06/23/16  Educator  DJ  Instruction Review Code  2- meets goals/outcomes      Congestive Heart Failure -Discuss the definition of CHF, how to live with CHF, the signs and symptoms of CHF, and how keep track of weight and sodium intake.   CARDIAC REHAB PHASE II EXERCISE from 07/14/2016 in [place]  Date  06/30/16  Educator  DC  Instruction Review Code  2- meets goals/outcomes      Heart Disease and Intimacy -Discus the effect sexual activity has on the heart, how changes occur during intimacy as we age, and safety during sexual activity.   Smoking Cessation / COPD -Discuss different methods to quit smoking, the health benefits of quitting smoking, and the definition of COPD.   CARDIAC REHAB PHASE II EXERCISE from 07/14/2016 in [place]  Date  07/14/16  Educator  Russella Dar  Instruction Review Code  2- meets goals/outcomes      Nutrition I: Fats -Discuss the types of cholesterol, what cholesterol does to the heart, and how cholesterol levels can be controlled.   Nutrition II: Labels -Discuss the different components of food labels and how to read food label   Heart Parts and Heart Disease -Discuss the anatomy of the heart, the pathway of blood circulation through the heart, and these are affected by heart disease.   Stress I: Signs and Symptoms -Discuss the causes of stress, how stress may lead to anxiety and depression, and ways to limit  stress.   Stress II: Relaxation -Discuss different types of relaxation techniques to limit stress.   Warning Signs of Stroke / TIA -Discuss definition of a stroke, what the signs and symptoms are of a stroke, and how to identify when someone is having stroke.   Knowledge Questionnaire Score:     Knowledge Questionnaire Score - 06/15/16 1254      Knowledge Questionnaire Score   Pre Score 19/24      Core Components/Risk Factors/Patient Goals at Admission:     Personal Goals and Risk Factors at Admission - 06/15/16 1257      Core Components/Risk Factors/Patient Goals on Admission    Weight Management Weight Maintenance   Improve shortness of breath with ADL's Yes   Intervention Provide education, individualized exercise plan and daily activity instruction to help decrease symptoms of SOB with activities of daily living.   Expected Outcomes Short Term: Achieves a reduction of symptoms when performing activities of daily living.   Personal Goal Other Yes   Personal Goal Get stonger and increase endurance.    Intervention Attend 3 x week in CR and supplement with 2 x week exercise at home.    Expected Outcomes Achieve personal goals.       Core Components/Risk Factors/Patient Goals Review:      Goals and Risk Factor Review    Row Name 06/15/16 1259 07/21/16 1425  Core Components/Risk Factors/Patient Goals Review   Personal Goals Review Diabetes;Improve shortness of breath with ADL's Weight Management/Obesity  Get stronger; increase endurance.       Review  - Patient has completed 8 sessions gaining 6 lbs. He has been out of town for 2 weeks which has impeded his progression. He says he feels stronger. Will continue to monitor for progress.       Expected Outcomes  - Patient will continue to attend sessions and work toward meeting his goals.          Core Components/Risk Factors/Patient Goals at Discharge (Final Review):      Goals and Risk Factor Review -  07/21/16 1425      Core Components/Risk Factors/Patient Goals Review   Personal Goals Review Weight Management/Obesity  Get stronger; increase endurance.    Review Patient has completed 8 sessions gaining 6 lbs. He has been out of town for 2 weeks which has impeded his progression. He says he feels stronger. Will continue to monitor for progress.    Expected Outcomes Patient will continue to attend sessions and work toward meeting his goals.       ITP Comments:     ITP Comments    Row Name 06/15/16 1255 06/23/16 0751         ITP Comments Mr. Biggar is a pleasant 76 yr. old male. He has had a recent (R) kidney transplant and prostate removed. He is doing well.  Patient new to program. He has compeleted 2 sessions. Will continue to monitor for progress.          Comments: ITP 30 Day REVIEW Patient doing well in the program. Will continue to monitor for progress.

## 2016-07-23 ENCOUNTER — Encounter (HOSPITAL_COMMUNITY): Payer: Medicare Other

## 2016-07-26 ENCOUNTER — Encounter (HOSPITAL_COMMUNITY)
Admission: RE | Admit: 2016-07-26 | Discharge: 2016-07-26 | Disposition: A | Discharge status: Home / Self Care | Payer: Medicare Other | Source: Ambulatory Visit | Attending: Cardiovascular Disease | Admitting: Cardiovascular Disease

## 2016-07-26 DIAGNOSIS — Z48812 Encounter for surgical aftercare following surgery on the circulatory system: Secondary | ICD-10-CM | POA: Diagnosis not present

## 2016-07-26 DIAGNOSIS — Z9889 Other specified postprocedural states: Secondary | ICD-10-CM | POA: Diagnosis not present

## 2016-07-26 DIAGNOSIS — Z951 Presence of aortocoronary bypass graft: Secondary | ICD-10-CM | POA: Diagnosis not present

## 2016-07-26 NOTE — Progress Notes (Signed)
Daily Session Note  Patient Details  Name: Corey Riley MRN: [id number] Date of Birth: [birth year] Referring Provider:     [name] from 06/15/2016 in [place]  Referring Provider  Dr. Bronson Ing      Encounter Date: 07/26/2016  Check In:     Session Check In - 07/26/16 0930      Check-In   Location [facility]   Staff Present Aundra Dubin, RN, BSN;Gregory Luther Parody, BS, EP, Exercise Physiologist   Supervising physician immediately available to respond to emergencies See telemetry face sheet for immediately available MD   Medication changes reported     No   Fall or balance concerns reported    No   Tobacco Cessation No Change   Warm-up and Cool-down Performed as group-led instruction   Resistance Training Performed Yes   VAD Patient? No     Pain Assessment   Currently in Pain? No/denies   Pain Score 0-No pain   Multiple Pain Sites No      Capillary Blood Glucose: No results found for this or any previous visit (from the past 24 hour(s)).    History  Smoking Status  . Former Smoker  . Packs/day: 1.50  . Years: 11.00  . Types: Cigarettes  . Start date: 08/14/1955  . Quit date: 12/15/1966  Smokeless Tobacco  . Never Used    Goals Met:  Independence with exercise equipment Exercise tolerated well No report of cardiac concerns or symptoms Strength training completed today  Goals Unmet:  Not Applicable  Comments: Check out 1030.   Dr. Kate Sable is Medical Director for [facility] Cardiac and Pulmonary Rehab.

## 2016-07-28 ENCOUNTER — Encounter (HOSPITAL_COMMUNITY)
Admission: RE | Admit: 2016-07-28 | Discharge: 2016-07-28 | Disposition: A | Discharge status: Home / Self Care | Payer: Medicare Other | Source: Ambulatory Visit | Attending: Cardiovascular Disease | Admitting: Cardiovascular Disease

## 2016-07-28 DIAGNOSIS — Z951 Presence of aortocoronary bypass graft: Secondary | ICD-10-CM | POA: Diagnosis not present

## 2016-07-28 DIAGNOSIS — Z48812 Encounter for surgical aftercare following surgery on the circulatory system: Secondary | ICD-10-CM | POA: Diagnosis not present

## 2016-07-28 DIAGNOSIS — Z9889 Other specified postprocedural states: Secondary | ICD-10-CM | POA: Diagnosis not present

## 2016-07-28 NOTE — Progress Notes (Signed)
Daily Session Note  Patient Details  Name: Corey Riley MRN: [id number] Date of Birth: 1935/08/25 Referring Provider:     [name] from 06/15/2016 in [place]  Referring Provider  Dr. Bronson Ing      Encounter Date: 07/28/2016  Check In:     Session Check In - 07/28/16 0929      Check-In   Location [facility]   Staff Present Russella Dar, MS, EP, [facility], Exercise Physiologist;Jaryd Drew Wynetta Emery, RN, BSN;Gregory Cowan, BS, EP, Exercise Physiologist   Supervising physician immediately available to respond to emergencies See telemetry face sheet for immediately available MD   Medication changes reported     No   Fall or balance concerns reported    No   Tobacco Cessation No Change   Warm-up and Cool-down Performed as group-led instruction   Resistance Training Performed Yes   VAD Patient? No     Pain Assessment   Currently in Pain? No/denies   Pain Score 0-No pain   Multiple Pain Sites No      Capillary Blood Glucose: No results found for this or any previous visit (from the past 24 hour(s)).    History  Smoking Status  . Former Smoker  . Packs/day: 1.50  . Years: 11.00  . Types: Cigarettes  . Start date: 08/14/1955  . Quit date: 12/15/1966  Smokeless Tobacco  . Never Used    Goals Met:  Independence with exercise equipment Exercise tolerated well No report of cardiac concerns or symptoms Strength training completed today  Goals Unmet:  Not Applicable  Comments: Check out 1030.   Dr. Kate Sable is Medical Director for [facility] Cardiac and Pulmonary Rehab.

## 2016-07-30 ENCOUNTER — Encounter (HOSPITAL_COMMUNITY)
Admission: RE | Admit: 2016-07-30 | Discharge: 2016-07-30 | Disposition: A | Discharge status: Home / Self Care | Payer: Medicare Other | Source: Ambulatory Visit | Attending: Cardiovascular Disease | Admitting: Cardiovascular Disease

## 2016-07-30 DIAGNOSIS — Z9889 Other specified postprocedural states: Secondary | ICD-10-CM | POA: Diagnosis not present

## 2016-07-30 DIAGNOSIS — Z48812 Encounter for surgical aftercare following surgery on the circulatory system: Secondary | ICD-10-CM | POA: Diagnosis not present

## 2016-07-30 DIAGNOSIS — Z951 Presence of aortocoronary bypass graft: Secondary | ICD-10-CM

## 2016-07-30 NOTE — Progress Notes (Signed)
Daily Session Note  Patient Details  Name: [name] MRN: [id number] Date of Birth: [birth year] Referring Provider:     [name] from 06/15/2016 in [place]  Referring Provider  Dr. Bronson Ing      Encounter Date: 07/30/2016  Check In:     Session Check In - 07/30/16 0927      Check-In   Location [facility]   Staff Present Aundra Dubin, RN, BSN;Gregory Luther Parody, BS, EP, Exercise Physiologist   Supervising physician immediately available to respond to emergencies See telemetry face sheet for immediately available MD   Medication changes reported     No   Fall or balance concerns reported    No   Tobacco Cessation No Change   Warm-up and Cool-down Performed as group-led instruction   Resistance Training Performed Yes   VAD Patient? No     Pain Assessment   Currently in Pain? No/denies   Pain Score 0-No pain   Multiple Pain Sites No      Capillary Blood Glucose: No results found for this or any previous visit (from the past 24 hour(s)).    History  Smoking Status  . Former Smoker  . Packs/day: 1.50  . Years: 11.00  . Types: Cigarettes  . Start date: 08/14/1955  . Quit date: 12/15/1966  Smokeless Tobacco  . Never Used    Goals Met:  Independence with exercise equipment Exercise tolerated well No report of cardiac concerns or symptoms Strength training completed today  Goals Unmet:  Not Applicable  Comments: Check out 1030.   Dr. Kate Sable is Medical Director for [facility] Cardiac and Pulmonary Rehab.

## 2016-08-02 ENCOUNTER — Encounter (HOSPITAL_COMMUNITY)
Admission: RE | Admit: 2016-08-02 | Discharge: 2016-08-02 | Disposition: A | Discharge status: Home / Self Care | Payer: Medicare Other | Source: Ambulatory Visit | Attending: Cardiovascular Disease | Admitting: Cardiovascular Disease

## 2016-08-02 DIAGNOSIS — Z48812 Encounter for surgical aftercare following surgery on the circulatory system: Secondary | ICD-10-CM | POA: Insufficient documentation

## 2016-08-02 DIAGNOSIS — Z951 Presence of aortocoronary bypass graft: Secondary | ICD-10-CM | POA: Diagnosis not present

## 2016-08-02 DIAGNOSIS — Z9889 Other specified postprocedural states: Secondary | ICD-10-CM | POA: Diagnosis not present

## 2016-08-02 NOTE — Progress Notes (Signed)
Daily Session Note  Patient Details  Name: FIELDS OROS MRN: [id number] Date of Birth: [birth year] Referring Provider:     [name] from 06/15/2016 in [place]  Referring Provider  Dr. Bronson Ing      Encounter Date: 08/02/2016  Check In:     Session Check In - 08/02/16 0947      Check-In   Location [facility]   Staff Present Russella Dar, MS, EP, [facility], Exercise Physiologist;Brandn Mcgath Luther Parody, BS, EP, Exercise Physiologist   Supervising physician immediately available to respond to emergencies See telemetry face sheet for immediately available MD   Medication changes reported     No   Fall or balance concerns reported    No   Warm-up and Cool-down Performed as group-led instruction   Resistance Training Performed Yes   VAD Patient? No     Pain Assessment   Currently in Pain? No/denies   Pain Score 0-No pain   Multiple Pain Sites No      Capillary Blood Glucose: No results found for this or any previous visit (from the past 24 hour(s)).    History  Smoking Status  . Former Smoker  . Packs/day: 1.50  . Years: 11.00  . Types: Cigarettes  . Start date: 08/14/1955  . Quit date: 12/15/1966  Smokeless Tobacco  . Never Used    Goals Met:  Independence with exercise equipment Exercise tolerated well No report of cardiac concerns or symptoms Strength training completed today  Goals Unmet:  Not Applicable  Comments: Check out 1030   Dr. Kate Sable is Medical Director for [place] and Pulmonary Rehab.

## 2016-08-04 ENCOUNTER — Encounter (HOSPITAL_COMMUNITY): Payer: Medicare Other

## 2016-08-06 ENCOUNTER — Encounter (HOSPITAL_COMMUNITY)
Admission: RE | Admit: 2016-08-06 | Discharge: 2016-08-06 | Disposition: A | Discharge status: Home / Self Care | Payer: Medicare Other | Source: Ambulatory Visit | Attending: Cardiovascular Disease | Admitting: Cardiovascular Disease

## 2016-08-06 DIAGNOSIS — Z951 Presence of aortocoronary bypass graft: Secondary | ICD-10-CM | POA: Diagnosis not present

## 2016-08-06 DIAGNOSIS — Z9889 Other specified postprocedural states: Secondary | ICD-10-CM | POA: Diagnosis not present

## 2016-08-06 DIAGNOSIS — Z48812 Encounter for surgical aftercare following surgery on the circulatory system: Secondary | ICD-10-CM | POA: Diagnosis not present

## 2016-08-06 NOTE — Progress Notes (Signed)
Daily Session Note  Patient Details  Name: Corey Riley MRN: [id number] Date of Birth: December 30, 1935 Referring Provider:     [name] from 06/15/2016 in [place]  Referring Provider  Dr. Bronson Ing      Encounter Date: 08/06/2016  Check In:     Session Check In - 08/06/16 0911      Check-In   Location [facility]   Staff Present Russella Dar, MS, EP, [facility], Exercise Physiologist;Fatimah Sundquist Wynetta Emery, RN, BSN;Gregory Cowan, BS, EP, Exercise Physiologist   Supervising physician immediately available to respond to emergencies See telemetry face sheet for immediately available MD   Medication changes reported     No   Fall or balance concerns reported    No   Tobacco Cessation No Change   Warm-up and Cool-down Performed as group-led instruction   Resistance Training Performed Yes   VAD Patient? No     Pain Assessment   Currently in Pain? No/denies   Pain Score 0-No pain   Multiple Pain Sites No      Capillary Blood Glucose: No results found for this or any previous visit (from the past 24 hour(s)).    History  Smoking Status  . Former Smoker  . Packs/day: 1.50  . Years: 11.00  . Types: Cigarettes  . Start date: 08/14/1955  . Quit date: 12/15/1966  Smokeless Tobacco  . Never Used    Goals Met:  Independence with exercise equipment Exercise tolerated well No report of cardiac concerns or symptoms Strength training completed today  Goals Unmet:  Not Applicable  Comments: Check out 1030.   Dr. Kate Sable is Medical Director for [facility] Cardiac and Pulmonary Rehab.

## 2016-08-09 ENCOUNTER — Encounter (HOSPITAL_COMMUNITY)
Admission: RE | Admit: 2016-08-09 | Discharge: 2016-08-09 | Disposition: A | Discharge status: Home / Self Care | Payer: Medicare Other | Source: Ambulatory Visit | Attending: Cardiovascular Disease | Admitting: Cardiovascular Disease

## 2016-08-09 DIAGNOSIS — Z48812 Encounter for surgical aftercare following surgery on the circulatory system: Secondary | ICD-10-CM | POA: Diagnosis not present

## 2016-08-09 DIAGNOSIS — Z951 Presence of aortocoronary bypass graft: Secondary | ICD-10-CM

## 2016-08-09 DIAGNOSIS — Z9889 Other specified postprocedural states: Secondary | ICD-10-CM | POA: Diagnosis not present

## 2016-08-09 NOTE — Progress Notes (Signed)
Daily Session Note  Patient Details  Name: Corey Riley MRN: [id number] Date of Birth: 11-25-1935 Referring Provider:     [name] from 06/15/2016 in [place]  Referring Provider  Dr. Bronson Ing      Encounter Date: 08/09/2016  Check In:     Session Check In - 08/09/16 0930      Check-In   Location [facility]   Staff Present Aundra Dubin, RN, BSN;Gregory Luther Parody, BS, EP, Exercise Physiologist   Supervising physician immediately available to respond to emergencies See telemetry face sheet for immediately available MD   Medication changes reported     No   Fall or balance concerns reported    No   Tobacco Cessation No Change   Warm-up and Cool-down Performed as group-led instruction   Resistance Training Performed Yes   VAD Patient? No     Pain Assessment   Currently in Pain? No/denies   Pain Score 0-No pain   Multiple Pain Sites No      Capillary Blood Glucose: No results found for this or any previous visit (from the past 24 hour(s)).    History  Smoking Status  . Former Smoker  . Packs/day: 1.50  . Years: 11.00  . Types: Cigarettes  . Start date: 08/14/1955  . Quit date: 12/15/1966  Smokeless Tobacco  . Never Used    Goals Met:  Independence with exercise equipment Exercise tolerated well No report of cardiac concerns or symptoms Strength training completed today  Goals Unmet:  Not Applicable  Comments: Check out 1030.   Dr. Kate Sable is Medical Director for [facility] Cardiac and Pulmonary Rehab.

## 2016-08-11 ENCOUNTER — Encounter (HOSPITAL_COMMUNITY)
Admission: RE | Admit: 2016-08-11 | Discharge: 2016-08-11 | Disposition: A | Discharge status: Home / Self Care | Payer: Medicare Other | Source: Ambulatory Visit | Attending: Cardiovascular Disease | Admitting: Cardiovascular Disease

## 2016-08-11 DIAGNOSIS — Z951 Presence of aortocoronary bypass graft: Secondary | ICD-10-CM

## 2016-08-11 DIAGNOSIS — Z48812 Encounter for surgical aftercare following surgery on the circulatory system: Secondary | ICD-10-CM | POA: Diagnosis not present

## 2016-08-11 DIAGNOSIS — Z9889 Other specified postprocedural states: Secondary | ICD-10-CM | POA: Diagnosis not present

## 2016-08-11 NOTE — Progress Notes (Signed)
Daily Session Note  Patient Details  Name: Corey Riley MRN: [id number] Date of Birth: 1935/11/12 Referring Provider:     [name] from 06/15/2016 in [place]  Referring Provider  Dr. Bronson Ing      Encounter Date: 08/11/2016  Check In:     Session Check In - 08/11/16 0924      Check-In   Location [facility]   Staff Present Aundra Dubin, RN, BSN;Gregory Luther Parody, BS, EP, Exercise Physiologist   Supervising physician immediately available to respond to emergencies See telemetry face sheet for immediately available MD   Medication changes reported     No   Fall or balance concerns reported    No   Tobacco Cessation No Change   Warm-up and Cool-down Performed as group-led instruction   Resistance Training Performed Yes   VAD Patient? No     Pain Assessment   Currently in Pain? No/denies   Pain Score 0-No pain   Multiple Pain Sites No      Capillary Blood Glucose: No results found for this or any previous visit (from the past 24 hour(s)).    History  Smoking Status  . Former Smoker  . Packs/day: 1.50  . Years: 11.00  . Types: Cigarettes  . Start date: 08/14/1955  . Quit date: 12/15/1966  Smokeless Tobacco  . Never Used    Goals Met:  Independence with exercise equipment Exercise tolerated well No report of cardiac concerns or symptoms Strength training completed today  Goals Unmet:  Not Applicable  Comments: Check out 1030.   Dr. Kate Sable is Medical Director for [facility] Cardiac and Pulmonary Rehab.

## 2016-08-11 NOTE — Progress Notes (Signed)
Cardiac Individual Treatment Plan  Patient Details  Name: Corey Riley MRN: [id number] Date of Birth: Aug 29, 1935 Referring Provider:     [name] from 06/15/2016 in [place]  Referring Provider  Dr. Bronson Ing      Initial Encounter Date:    [name] from 06/15/2016 in [place]  Date  06/15/16  Referring Provider  Dr. Bronson Ing      Visit Diagnosis: S/P CABG x 3  Patient's Home Medications on Admission:  Current Outpatient Prescriptions:  .  amLODipine (NORVASC) 5 MG tablet, Take 1 tablet (5 mg total) by mouth daily. (Patient taking differently: Take 10 mg by mouth daily. ), Disp: 30 tablet, Rfl: 6 .  aspirin EC 81 MG tablet, Take 162 mg by mouth daily., Disp: , Rfl:  .  atorvastatin (LIPITOR) 20 MG tablet, Take 20 mg by mouth every Monday, Wednesday, and Friday., Disp: , Rfl:  .  dapsone 25 MG tablet, Take 2 tablets by mouth daily., Disp: , Rfl:  .  docusate sodium (COLACE) 100 MG capsule, Take 100 mg by mouth 2 (two) times daily as needed., Disp: , Rfl:  .  insulin glargine (LANTUS SOLOSTAR) 100 UNIT/ML injection, Inject 18 Units into the skin daily. , Disp: , Rfl:  .  levothyroxine (SYNTHROID, LEVOTHROID) 125 MCG tablet, Take 125 mcg by mouth daily before breakfast., Disp: , Rfl:  .  metoprolol (LOPRESSOR) 50 MG tablet, Take 1 tablet by mouth 2 (two) times daily. Has 100 mg tab currently that he cuts in half, Disp: , Rfl:  .  mycophenolate (MYFORTIC) 180 MG EC tablet, Take 180 mg by mouth 2 (two) times daily. , Disp: , Rfl:  .  nitroGLYCERIN (NITROSTAT) 0.4 MG SL tablet, Place 1 tablet (0.4 mg total) under the tongue every 5 (five) minutes x 3 doses as needed for chest pain. If no relief after 3 rd dose, proceed to the ED for an evaluation, Disp: 25 tablet, Rfl: 3 .  NOVOLOG FLEXPEN 100 UNIT/ML FlexPen, Inject 10 Units into the skin 3 (three) times daily. Plus sliding scale,  Disp: , Rfl: 1 .  omeprazole (PRILOSEC) 20 MG capsule, Take 1 capsule by mouth daily., Disp: , Rfl:  .  oxyCODONE (OXY IR/ROXICODONE) 5 MG immediate release tablet, Take 5 mg by mouth every 4 (four) hours as needed. for pain, Disp: , Rfl: 0 .  polyethylene glycol powder (GLYCOLAX/MIRALAX) powder, Take 17 g by mouth daily as needed., Disp: , Rfl:  .  predniSONE (DELTASONE) 5 MG tablet, Take 5 mg by mouth daily., Disp: , Rfl:  .  simvastatin (ZOCOR) 40 MG tablet, Take 1 tablet by mouth daily., Disp: , Rfl:  .  Tacrolimus 1 MG TB24, Take 2 mg by mouth daily. , Disp: , Rfl:  .  tamsulosin (FLOMAX) 0.4 MG CAPS capsule, Take 1 capsule by mouth at bedtime., Disp: , Rfl: 5 No current facility-administered medications for this encounter.   Facility-Administered Medications Ordered in Other Encounters:  .  cyclopentolate-phenylephrine (CYCLOMYDRYL) 0.2-1 % ophthalmic solution 1 drop, 1 drop, Right Eye, Once, Williams Che, MD .  ketorolac (ACULAR) 0.5 % ophthalmic solution 1 drop, 1 drop, Right Eye, Once, Williams Che, MD  Past Medical History: Past Medical History:  Diagnosis Date  . Anemia   . Anginal pain ([place])    "a little."   . Aortic stenosis    Mild  . Chronic kidney disease    pt  states 30% kidney function started dialysis 04/23/14 T/Th/Sa  . Constipation   . Coronary atherosclerosis of native coronary artery    DES LAD 8/03  . Essential hypertension, benign   . Heart murmur    "faint"  . Hypothyroidism   . Mixed hyperlipidemia   . Pneumonia   . Shortness of breath    with exertion  . Type 2 diabetes mellitus ([facility])     Tobacco Use: History  Smoking Status  . Former Smoker  . Packs/day: 1.50  . Years: 11.00  . Types: Cigarettes  . Start date: 08/14/1955  . Quit date: 12/15/1966  Smokeless Tobacco  . Never Used    Labs: Recent Review Flowsheet Data    There is no flowsheet data to display.      Capillary Blood Glucose: Lab Results  Component Value Date    GLUCAP 128 (H) 05/08/2014   GLUCAP 151 (H) 04/22/2014   GLUCAP 136 (H) 04/22/2014   GLUCAP 146 (H) 04/22/2014   GLUCAP 147 (H) 04/17/2012     Exercise Target Goals:    Exercise Program Goal: Individual exercise prescription set with THRR, safety & activity barriers. Participant demonstrates ability to understand and report RPE using BORG scale, to self-measure pulse accurately, and to acknowledge the importance of the exercise prescription.  Exercise Prescription Goal: Starting with aerobic activity 30 plus minutes a day, 3 days per week for initial exercise prescription. Provide home exercise prescription and guidelines that participant acknowledges understanding prior to discharge.  Activity Barriers & Risk Stratification:   6 Minute Walk:     6 Minute Walk    Row Name 06/15/16 1153         6 Minute Walk   Phase Initial     Distance 1250 feet     Distance % Change 0 %     Walk Time 6 minutes     # of Rest Breaks 0     MPH 2.36     METS 2.81     RPE 11     Perceived Dyspnea  9     VO2 Peak 8.54     Symptoms No     Resting HR 55 bpm     Resting BP 170/62     Max Ex. HR 66 bpm     Max Ex. BP 168/60     2 Minute Post BP 166/64        Oxygen Initial Assessment:   Oxygen Re-Evaluation:   Oxygen Discharge (Final Oxygen Re-Evaluation):   Initial Exercise Prescription:     Initial Exercise Prescription - 06/15/16 1100      Date of Initial Exercise RX and Referring Provider   Date 06/15/16   Referring Provider Dr. Bronson Ing     Treadmill   MPH 2   Grade 0   Minutes 15   METs 2.5     NuStep   Level 2   SPM 17   Minutes 20   METs 1.9     Prescription Details   Frequency (times per week) 3   Duration Progress to 30 minutes of continuous aerobic without signs/symptoms of physical distress     Intensity   THRR 40-80% of Max Heartrate 90-107-125   Ratings of Perceived Exertion 11-13   Perceived Dyspnea 0-4     Progression   Progression  Continue progressive overload as per policy without signs/symptoms or physical distress.     Resistance Training   Training Prescription Yes   Weight 1  Reps 10-15      Perform Capillary Blood Glucose checks as needed.  Exercise Prescription Changes:      Exercise Prescription Changes    Row Name 06/21/16 1400 07/05/16 1400 07/15/16 1500 08/02/16 1500       Response to Exercise   Blood Pressure (Admit) 146/58 160/50 160/50 162/62    Blood Pressure (Exercise) 150/50 150/50 166/60 170/82    Blood Pressure (Exit) 122/62 130/50 146/50 136/70    Heart Rate (Admit) 53 bpm 56 bpm 48 bpm 51 bpm    Heart Rate (Exercise) 70 bpm 67 bpm 69 bpm 64 bpm    Heart Rate (Exit) 62 bpm 61 bpm 60 bpm 56 bpm    Rating of Perceived Exertion (Exercise) '13 13 12 11    '$ Duration Progress to 30 minutes of  aerobic without signs/symptoms of physical distress Progress to 30 minutes of  aerobic without signs/symptoms of physical distress Progress to 30 minutes of  aerobic without signs/symptoms of physical distress Progress to 30 minutes of  aerobic without signs/symptoms of physical distress    Intensity THRR unchanged THRR unchanged THRR unchanged THRR unchanged      Progression   Progression Continue to progress workloads to maintain intensity without signs/symptoms of physical distress. Continue to progress workloads to maintain intensity without signs/symptoms of physical distress. Continue to progress workloads to maintain intensity without signs/symptoms of physical distress. Continue to progress workloads to maintain intensity without signs/symptoms of physical distress.      Resistance Training   Training Prescription Yes Yes Yes Yes    Weight '1 1 1 3    '$ Reps 10-15 10-15 10-15 10-15      Treadmill   MPH 2 2 2.1 2.3    Grade 0 0 0 0    Minutes '15 15 15 15    '$ METs 2.5 2.5 2.6 2.7      NuStep   Level '2 2 2 3    '$ SPM '17 22 18 23    '$ Minutes '20 20 20 20    '$ METs 1.9 3.51 3.5 2.2      Home  Exercise Plan   Plans to continue exercise at Home (comment) Home (comment) Home (comment) Home (comment)    Frequency Add 2 additional days to program exercise sessions. Add 2 additional days to program exercise sessions. Add 2 additional days to program exercise sessions. Add 2 additional days to program exercise sessions.       Exercise Comments:      Exercise Comments    Row Name 06/21/16 1454 07/05/16 1432 07/15/16 1530 08/02/16 1501     Exercise Comments Patient has just started CR and will be progressed in time.  Patient is doing well in CR. He has not increased in level but has increased in watts.  Patient is doing well in CR.  Patient is doing well in CR.        Exercise Goals and Review:      Exercise Goals    Row Name 06/15/16 1257             Exercise Goals   Increase Physical Activity Yes       Intervention Provide advice, education, support and counseling about physical activity/exercise needs.;Develop an individualized exercise prescription for aerobic and resistive training based on initial evaluation findings, risk stratification, comorbidities and participant's personal goals.       Expected Outcomes Achievement of increased cardiorespiratory fitness and enhanced flexibility, muscular endurance and strength shown through measurements  of functional capacity and personal statement of participant.       Increase Strength and Stamina Yes       Intervention Provide advice, education, support and counseling about physical activity/exercise needs.;Develop an individualized exercise prescription for aerobic and resistive training based on initial evaluation findings, risk stratification, comorbidities and participant's personal goals.       Expected Outcomes Achievement of increased cardiorespiratory fitness and enhanced flexibility, muscular endurance and strength shown through measurements of functional capacity and personal statement of participant.           Exercise Goals Re-Evaluation :     Exercise Goals Re-Evaluation    Row Name 07/21/16 1427 08/11/16 1302           Exercise Goal Re-Evaluation   Exercise Goals Review Increase Physical Activity;Increase Strenth and Stamina Increase Physical Activity;Increase Strenth and Stamina      Comments Patient has completed 8 sessions with some progression. He says he feels stronger since starting the program. Will continue to monitor for progress.  Patient has completed 15 sessions with increased strength and stamina. He says his legs and arms feel stronger and he feels better overall with more energy.       Expected Outcomes Patient will complete the program with increased strength, stamina, and activity. Patient will complete the program with continued increased strength, stamina, and activity.          Discharge Exercise Prescription (Final Exercise Prescription Changes):     Exercise Prescription Changes - 08/02/16 1500      Response to Exercise   Blood Pressure (Admit) 162/62   Blood Pressure (Exercise) 170/82   Blood Pressure (Exit) 136/70   Heart Rate (Admit) 51 bpm   Heart Rate (Exercise) 64 bpm   Heart Rate (Exit) 56 bpm   Rating of Perceived Exertion (Exercise) 11   Duration Progress to 30 minutes of  aerobic without signs/symptoms of physical distress   Intensity THRR unchanged     Progression   Progression Continue to progress workloads to maintain intensity without signs/symptoms of physical distress.     Resistance Training   Training Prescription Yes   Weight 3   Reps 10-15     Treadmill   MPH 2.3   Grade 0   Minutes 15   METs 2.7     NuStep   Level 3   SPM 23   Minutes 20   METs 2.2     Home Exercise Plan   Plans to continue exercise at Home (comment)   Frequency Add 2 additional days to program exercise sessions.      Nutrition:  Target Goals: Understanding of nutrition guidelines, daily intake of sodium '1500mg'$ , cholesterol '200mg'$ , calories 30%  from fat and 7% or less from saturated fats, daily to have 5 or more servings of fruits and vegetables.  Biometrics:     Pre Biometrics - 06/15/16 1155      Pre Biometrics   Height '5\' 9"'$  (1.753 m)   Weight 166 lb 7.2 oz (75.5 kg)   Waist Circumference 34 inches   Hip Circumference 37 inches   Waist to Hip Ratio 0.92 %   BMI (Calculated) 24.6   Triceps Skinfold 13 mm   % Body Fat 23.2 %   Grip Strength 59.13 kg   Flexibility 0 in  Test was not available   Single Leg Stand 3 seconds       Nutrition Therapy Plan and Nutrition Goals:   Nutrition Discharge: Rate Your  Plate Scores:   Nutrition Goals Re-Evaluation:   Nutrition Goals Discharge (Final Nutrition Goals Re-Evaluation):   Psychosocial: Target Goals: Acknowledge presence or absence of significant depression and/or stress, maximize coping skills, provide positive support system. Participant is able to verbalize types and ability to use techniques and skills needed for reducing stress and depression.  Initial Review & Psychosocial Screening:     Initial Psych Review & Screening - 06/15/16 1300      Initial Review   Current issues with None Identified     Family Dynamics   Good Support System? Yes     Barriers   Psychosocial barriers to participate in program There are no identifiable barriers or psychosocial needs.     Screening Interventions   Interventions Encouraged to exercise      Quality of Life Scores:     Quality of Life - 06/15/16 1156      Quality of Life Scores   Health/Function Pre 22.57 %   Socioeconomic Pre 24.92 %   Psych/Spiritual Pre 25.93 %   Family Pre 28.3 %   GLOBAL Pre 24.58 %      PHQ-9: Recent Review Flowsheet Data    Depression screen [facility] 2/9 06/15/2016   Decreased Interest 0   Down, Depressed, Hopeless 0   PHQ - 2 Score 0   Altered sleeping 0   Tired, decreased energy 1   Change in appetite 0   Feeling bad or failure about yourself  0   Trouble concentrating 0    Moving slowly or fidgety/restless 0   Suicidal thoughts 0   PHQ-9 Score 1   Difficult doing work/chores Not difficult at all     Interpretation of Total Score  Total Score Depression Severity:  1-4 = Minimal depression, 5-9 = Mild depression, 10-14 = Moderate depression, 15-19 = Moderately severe depression, 20-27 = Severe depression   Psychosocial Evaluation and Intervention:     Psychosocial Evaluation - 06/15/16 1300      Psychosocial Evaluation & Interventions   Interventions Encouraged to exercise with the program and follow exercise prescription   Continue Psychosocial Services  No Follow up required      Psychosocial Re-Evaluation:     Psychosocial Re-Evaluation    [place] Name 07/21/16 1429 08/11/16 1303           Psychosocial Re-Evaluation   Current issues with None Identified None Identified      Expected Outcomes Patient will have no psychosocial issues identified at discharge.  Patient will have no psychosocial barriers identified at discharge.       Interventions Encouraged to attend Cardiac Rehabilitation for the exercise Encouraged to attend Cardiac Rehabilitation for the exercise      Continue Psychosocial Services  No Follow up required No Follow up required         Psychosocial Discharge (Final Psychosocial Re-Evaluation):     Psychosocial Re-Evaluation - 08/11/16 1303      Psychosocial Re-Evaluation   Current issues with None Identified   Expected Outcomes Patient will have no psychosocial barriers identified at discharge.    Interventions Encouraged to attend Cardiac Rehabilitation for the exercise   Continue Psychosocial Services  No Follow up required      Vocational Rehabilitation: Provide vocational rehab assistance to qualifying candidates.   Vocational Rehab Evaluation & Intervention:     Vocational Rehab - 06/15/16 1254      Initial Vocational Rehab Evaluation & Intervention   Assessment shows need for Vocational Rehabilitation No  Education: Education Goals: Education classes will be provided on a weekly basis, covering required topics. Participant will state understanding/return demonstration of topics presented.  Learning Barriers/Preferences:     Learning Barriers/Preferences - 06/15/16 1251      Learning Barriers/Preferences   Learning Barriers None   Learning Preferences Computer/Internet;Written Material;Pictoral      Education Topics: Hypertension, Hypertension Reduction -Define heart disease and high blood pressure. Discus how high blood pressure affects the body and ways to reduce high blood pressure.   Exercise and Your Heart -Discuss why it is important to exercise, the FITT principles of exercise, normal and abnormal responses to exercise, and how to exercise safely.   Angina -Discuss definition of angina, causes of angina, treatment of angina, and how to decrease risk of having angina.   Cardiac Medications -Review what the following cardiac medications are used for, how they affect the body, and side effects that may occur when taking the medications.  Medications include Aspirin, Beta blockers, calcium channel blockers, ACE Inhibitors, angiotensin receptor blockers, diuretics, digoxin, and antihyperlipidemics.   CARDIAC REHAB PHASE II EXERCISE from 08/06/2016 in [place]  Date  06/23/16  Educator  DJ  Instruction Review Code  2- meets goals/outcomes      Congestive Heart Failure -Discuss the definition of CHF, how to live with CHF, the signs and symptoms of CHF, and how keep track of weight and sodium intake.   CARDIAC REHAB PHASE II EXERCISE from 08/06/2016 in [place]  Date  06/30/16  Educator  DC  Instruction Review Code  2- meets goals/outcomes      Heart Disease and Intimacy -Discus the effect sexual activity has on the heart, how changes occur during intimacy as we age, and safety during sexual activity.   Smoking  Cessation / COPD -Discuss different methods to quit smoking, the health benefits of quitting smoking, and the definition of COPD.   CARDIAC REHAB PHASE II EXERCISE from 08/06/2016 in [place]  Date  07/14/16  Educator  Russella Dar  Instruction Review Code  2- meets goals/outcomes      Nutrition I: Fats -Discuss the types of cholesterol, what cholesterol does to the heart, and how cholesterol levels can be controlled.   Nutrition II: Labels -Discuss the different components of food labels and how to read food label   CARDIAC REHAB PHASE II EXERCISE from 08/06/2016 in [place]  Date  07/28/16  Educator  DC  Instruction Review Code  2- meets goals/outcomes      Heart Parts and Heart Disease -Discuss the anatomy of the heart, the pathway of blood circulation through the heart, and these are affected by heart disease.   CARDIAC REHAB PHASE II EXERCISE from 08/06/2016 in [place]  Date  08/06/16  Educator  DC  Instruction Review Code  2- meets goals/outcomes      Stress I: Signs and Symptoms -Discuss the causes of stress, how stress may lead to anxiety and depression, and ways to limit stress.   Stress II: Relaxation -Discuss different types of relaxation techniques to limit stress.   Warning Signs of Stroke / TIA -Discuss definition of a stroke, what the signs and symptoms are of a stroke, and how to identify when someone is having stroke.   Knowledge Questionnaire Score:     Knowledge Questionnaire Score - 06/15/16 1254      Knowledge Questionnaire Score   Pre Score 19/24  Core Components/Risk Factors/Patient Goals at Admission:     Personal Goals and Risk Factors at Admission - 06/15/16 1257      Core Components/Risk Factors/Patient Goals on Admission    Weight Management Weight Maintenance   Improve shortness of breath with ADL's Yes   Intervention Provide education, individualized  exercise plan and daily activity instruction to help decrease symptoms of SOB with activities of daily living.   Expected Outcomes Short Term: Achieves a reduction of symptoms when performing activities of daily living.   Personal Goal Other Yes   Personal Goal Get stonger and increase endurance.    Intervention Attend 3 x week in CR and supplement with 2 x week exercise at home.    Expected Outcomes Achieve personal goals.       Core Components/Risk Factors/Patient Goals Review:      Goals and Risk Factor Review    Row Name 06/15/16 1259 07/21/16 1425 08/11/16 1258         Core Components/Risk Factors/Patient Goals Review   Personal Goals Review Diabetes;Improve shortness of breath with ADL's Weight Management/Obesity  Get stronger; increase endurance.  Weight Management/Obesity;Diabetes  Get stronger; increase endurance.      Review  - Patient has completed 8 sessions gaining 6 lbs. He has been out of town for 2 weeks which has impeded his progression. He says he feels stronger. Will continue to monitor for progress.  Patient has completed 15 sessions gaining 10 lbs. His had no HGB A1C on file. He is progressing well in the program saying he feels better overall. He attributes his weight gain to overeating. He feels the program is helping him meet his goals.      Expected Outcomes  - Patient will continue to attend sessions and work toward meeting his goals.  Patient will complete the program meeting his personal goals.         Core Components/Risk Factors/Patient Goals at Discharge (Final Review):      Goals and Risk Factor Review - 08/11/16 1258      Core Components/Risk Factors/Patient Goals Review   Personal Goals Review Weight Management/Obesity;Diabetes  Get stronger; increase endurance.    Review Patient has completed 15 sessions gaining 10 lbs. His had no HGB A1C on file. He is progressing well in the program saying he feels better overall. He attributes his weight gain to  overeating. He feels the program is helping him meet his goals.    Expected Outcomes Patient will complete the program meeting his personal goals.       ITP Comments:     ITP Comments    Row Name 06/15/16 1255 06/23/16 0751 08/05/16 1550       ITP Comments Mr. Printup is a pleasant 84 yr. old male. He has had a recent (R) kidney transplant and prostate removed. He is doing well.  Patient new to program. He has compeleted 2 sessions. Will continue to monitor for progress.  Patient met with Registered Dietitian to discuss nutrition topics including: Heart healthty eating, heart healthy cooking and making smart choices when shopping; Portion control; weight management; and hydration.        Comments: ITP 30 Day REVIEW Patient doing well in the program. Will continue to monitor for progress.

## 2016-08-12 DIAGNOSIS — D899 Disorder involving the immune mechanism, unspecified: Secondary | ICD-10-CM | POA: Diagnosis not present

## 2016-08-12 DIAGNOSIS — Z951 Presence of aortocoronary bypass graft: Secondary | ICD-10-CM | POA: Diagnosis not present

## 2016-08-12 DIAGNOSIS — N400 Enlarged prostate without lower urinary tract symptoms: Secondary | ICD-10-CM | POA: Diagnosis not present

## 2016-08-12 DIAGNOSIS — N320 Bladder-neck obstruction: Secondary | ICD-10-CM | POA: Diagnosis not present

## 2016-08-12 DIAGNOSIS — Z79899 Other long term (current) drug therapy: Secondary | ICD-10-CM | POA: Diagnosis not present

## 2016-08-12 DIAGNOSIS — Z794 Long term (current) use of insulin: Secondary | ICD-10-CM | POA: Diagnosis not present

## 2016-08-12 DIAGNOSIS — E119 Type 2 diabetes mellitus without complications: Secondary | ICD-10-CM | POA: Diagnosis not present

## 2016-08-12 DIAGNOSIS — I2510 Atherosclerotic heart disease of native coronary artery without angina pectoris: Secondary | ICD-10-CM | POA: Diagnosis not present

## 2016-08-12 DIAGNOSIS — Z4822 Encounter for aftercare following kidney transplant: Secondary | ICD-10-CM | POA: Diagnosis not present

## 2016-08-12 DIAGNOSIS — Z940 Kidney transplant status: Secondary | ICD-10-CM | POA: Diagnosis not present

## 2016-08-12 DIAGNOSIS — R609 Edema, unspecified: Secondary | ICD-10-CM | POA: Diagnosis not present

## 2016-08-12 DIAGNOSIS — N179 Acute kidney failure, unspecified: Secondary | ICD-10-CM | POA: Diagnosis not present

## 2016-08-12 DIAGNOSIS — Z882 Allergy status to sulfonamides status: Secondary | ICD-10-CM | POA: Diagnosis not present

## 2016-08-12 DIAGNOSIS — N186 End stage renal disease: Secondary | ICD-10-CM | POA: Diagnosis not present

## 2016-08-12 DIAGNOSIS — R1031 Right lower quadrant pain: Secondary | ICD-10-CM | POA: Diagnosis not present

## 2016-08-12 DIAGNOSIS — D631 Anemia in chronic kidney disease: Secondary | ICD-10-CM | POA: Diagnosis not present

## 2016-08-12 DIAGNOSIS — Z87440 Personal history of urinary (tract) infections: Secondary | ICD-10-CM | POA: Diagnosis not present

## 2016-08-12 DIAGNOSIS — E039 Hypothyroidism, unspecified: Secondary | ICD-10-CM | POA: Diagnosis not present

## 2016-08-12 DIAGNOSIS — Z792 Long term (current) use of antibiotics: Secondary | ICD-10-CM | POA: Diagnosis not present

## 2016-08-12 DIAGNOSIS — E1122 Type 2 diabetes mellitus with diabetic chronic kidney disease: Secondary | ICD-10-CM | POA: Diagnosis not present

## 2016-08-12 DIAGNOSIS — Z7982 Long term (current) use of aspirin: Secondary | ICD-10-CM | POA: Diagnosis not present

## 2016-08-12 DIAGNOSIS — I10 Essential (primary) hypertension: Secondary | ICD-10-CM | POA: Diagnosis not present

## 2016-08-12 DIAGNOSIS — E785 Hyperlipidemia, unspecified: Secondary | ICD-10-CM | POA: Diagnosis not present

## 2016-08-12 DIAGNOSIS — Z885 Allergy status to narcotic agent status: Secondary | ICD-10-CM | POA: Diagnosis not present

## 2016-08-12 DIAGNOSIS — I120 Hypertensive chronic kidney disease with stage 5 chronic kidney disease or end stage renal disease: Secondary | ICD-10-CM | POA: Diagnosis not present

## 2016-08-12 DIAGNOSIS — D8989 Other specified disorders involving the immune mechanism, not elsewhere classified: Secondary | ICD-10-CM | POA: Diagnosis not present

## 2016-08-12 DIAGNOSIS — N12 Tubulo-interstitial nephritis, not specified as acute or chronic: Secondary | ICD-10-CM | POA: Diagnosis not present

## 2016-08-13 ENCOUNTER — Encounter (HOSPITAL_COMMUNITY)
Admission: RE | Admit: 2016-08-13 | Discharge: 2016-08-13 | Disposition: A | Discharge status: Home / Self Care | Payer: Medicare Other | Source: Ambulatory Visit | Attending: Cardiovascular Disease | Admitting: Cardiovascular Disease

## 2016-08-13 DIAGNOSIS — Z48812 Encounter for surgical aftercare following surgery on the circulatory system: Secondary | ICD-10-CM | POA: Diagnosis not present

## 2016-08-13 DIAGNOSIS — Z9889 Other specified postprocedural states: Secondary | ICD-10-CM | POA: Diagnosis not present

## 2016-08-13 DIAGNOSIS — Z951 Presence of aortocoronary bypass graft: Secondary | ICD-10-CM | POA: Diagnosis not present

## 2016-08-13 NOTE — Progress Notes (Signed)
Daily Session Note  Patient Details  Name: Corey Riley MRN: [id number] Date of Birth: 07-Apr-1935 Referring Provider:     [name] from 06/15/2016 in [place]  Referring Provider  Dr. Bronson Ing      Encounter Date: 08/13/2016  Check In:     Session Check In - 08/13/16 0930      Check-In   Location [facility]   Staff Present Russella Dar, MS, EP, [facility], Exercise Physiologist;Gregory Luther Parody, BS, EP, Exercise Physiologist   Supervising physician immediately available to respond to emergencies See telemetry face sheet for immediately available MD   Medication changes reported     No   Fall or balance concerns reported    No   Tobacco Cessation No Change   Warm-up and Cool-down Performed as group-led instruction   Resistance Training Performed Yes   VAD Patient? No     Pain Assessment   Currently in Pain? No/denies   Pain Score 0-No pain   Multiple Pain Sites No      Capillary Blood Glucose: No results found for this or any previous visit (from the past 24 hour(s)).    History  Smoking Status  . Former Smoker  . Packs/day: 1.50  . Years: 11.00  . Types: Cigarettes  . Start date: 08/14/1955  . Quit date: 12/15/1966  Smokeless Tobacco  . Never Used    Goals Met:  Independence with exercise equipment Exercise tolerated well No report of cardiac concerns or symptoms Strength training completed today  Goals Unmet:  Not Applicable  Comments: Check out: 10:30   Dr. Kate Sable is Medical Director for [facility] and [facility].

## 2016-08-16 ENCOUNTER — Encounter (HOSPITAL_COMMUNITY)
Admission: RE | Admit: 2016-08-16 | Discharge: 2016-08-16 | Disposition: A | Discharge status: Home / Self Care | Payer: Medicare Other | Source: Ambulatory Visit | Attending: Cardiovascular Disease | Admitting: Cardiovascular Disease

## 2016-08-16 DIAGNOSIS — Z9889 Other specified postprocedural states: Secondary | ICD-10-CM | POA: Diagnosis not present

## 2016-08-16 DIAGNOSIS — Z48812 Encounter for surgical aftercare following surgery on the circulatory system: Secondary | ICD-10-CM | POA: Diagnosis not present

## 2016-08-16 DIAGNOSIS — Z951 Presence of aortocoronary bypass graft: Secondary | ICD-10-CM | POA: Diagnosis not present

## 2016-08-16 LAB — GLUCOSE, CAPILLARY: Glucose-Capillary: 279 mg/dL — ABNORMAL HIGH (ref 65–99)

## 2016-08-16 NOTE — Progress Notes (Signed)
Daily Session Note  Patient Details  Name: TAYLOR LEVICK MRN: [id number] Date of Birth: 11/08/1935 Referring Provider:     [name] from 06/15/2016 in [place]  Referring Provider  Dr. Bronson Ing      Encounter Date: 08/16/2016  Check In:     Session Check In - 08/16/16 0930      Check-In   Location [facility]   Staff Present Russella Dar, MS, EP, [facility], Exercise Physiologist;Gregory Luther Parody, BS, EP, Exercise Physiologist   Supervising physician immediately available to respond to emergencies See telemetry face sheet for immediately available MD   Medication changes reported     No   Fall or balance concerns reported    No   Tobacco Cessation No Change   Warm-up and Cool-down Performed as group-led instruction   Resistance Training Performed Yes   VAD Patient? No     Pain Assessment   Currently in Pain? No/denies   Pain Score 0-No pain   Multiple Pain Sites No      Capillary Blood Glucose: Results for orders placed or performed during the hospital encounter of 08/16/16 (from the past 24 hour(s))  Glucose, capillary     Status: Abnormal   Collection Time: 08/16/16  9:21 AM  Result Value Ref Range   Glucose-Capillary 279 (H) 65 - 99 mg/dL      History  Smoking Status  . Former Smoker  . Packs/day: 1.50  . Years: 11.00  . Types: Cigarettes  . Start date: 08/14/1955  . Quit date: 12/15/1966  Smokeless Tobacco  . Never Used    Goals Met:  Independence with exercise equipment Exercise tolerated well No report of cardiac concerns or symptoms Strength training completed today  Goals Unmet:  Not Applicable  Comments: Check out: 10:30 Patient had a low CBG fasting so we took one before exercise. CBG '279mg'$ /dl.    Dr. Kate Sable is Medical Director for [facility] and Pulmonary Rehab.

## 2016-08-18 ENCOUNTER — Encounter (HOSPITAL_COMMUNITY)
Admission: RE | Admit: 2016-08-18 | Discharge: 2016-08-18 | Disposition: A | Discharge status: Home / Self Care | Payer: Medicare Other | Source: Ambulatory Visit | Attending: Cardiovascular Disease | Admitting: Cardiovascular Disease

## 2016-08-18 DIAGNOSIS — Z9889 Other specified postprocedural states: Secondary | ICD-10-CM | POA: Diagnosis not present

## 2016-08-18 DIAGNOSIS — Z48812 Encounter for surgical aftercare following surgery on the circulatory system: Secondary | ICD-10-CM | POA: Diagnosis not present

## 2016-08-18 DIAGNOSIS — Z951 Presence of aortocoronary bypass graft: Secondary | ICD-10-CM | POA: Diagnosis not present

## 2016-08-18 NOTE — Progress Notes (Signed)
Daily Session Note  Patient Details  Name: Corey Riley MRN: [id number] Date of Birth: 09-Jun-1935 Referring Provider:     [name] from 06/15/2016 in [place]  Referring Provider  Dr. Bronson Ing      Encounter Date: 08/18/2016  Check In:     Session Check In - 08/18/16 1009      Check-In   Location [facility]   Staff Present Larinda Herter Angelina Pih, MS, EP, [facility], Exercise Physiologist;Gregory Luther Parody, BS, EP, Exercise Physiologist   Supervising physician immediately available to respond to emergencies See telemetry face sheet for immediately available MD   Medication changes reported     No   Fall or balance concerns reported    No   Tobacco Cessation No Change   Warm-up and Cool-down Performed as group-led instruction   Resistance Training Performed No   VAD Patient? No     Pain Assessment   Currently in Pain? No/denies   Pain Score 0-No pain   Multiple Pain Sites No      Capillary Blood Glucose: No results found for this or any previous visit (from the past 24 hour(s)).    History  Smoking Status  . Former Smoker  . Packs/day: 1.50  . Years: 11.00  . Types: Cigarettes  . Start date: 08/14/1955  . Quit date: 12/15/1966  Smokeless Tobacco  . Never Used    Goals Met:  Independence with exercise equipment Exercise tolerated well No report of cardiac concerns or symptoms Strength training completed today  Goals Unmet:  Not Applicable  Comments: Check out: 10:30   Dr. Kate Sable is Medical Director for [place] and Pulmonary Rehab.

## 2016-08-20 ENCOUNTER — Encounter (HOSPITAL_COMMUNITY)
Admission: RE | Admit: 2016-08-20 | Discharge: 2016-08-20 | Disposition: A | Discharge status: Home / Self Care | Payer: Medicare Other | Source: Ambulatory Visit | Attending: Cardiovascular Disease | Admitting: Cardiovascular Disease

## 2016-08-20 DIAGNOSIS — Z951 Presence of aortocoronary bypass graft: Secondary | ICD-10-CM

## 2016-08-20 DIAGNOSIS — Z9889 Other specified postprocedural states: Secondary | ICD-10-CM | POA: Diagnosis not present

## 2016-08-20 DIAGNOSIS — Z48812 Encounter for surgical aftercare following surgery on the circulatory system: Secondary | ICD-10-CM | POA: Diagnosis not present

## 2016-08-20 NOTE — Progress Notes (Signed)
Daily Session Note  Patient Details  Name: Corey Riley MRN: [id number] Date of Birth: October 06, 1935 Referring Provider:     [name] from 06/15/2016 in [place]  Referring Provider  Dr. Bronson Ing      Encounter Date: 08/20/2016  Check In:     Session Check In - 08/20/16 0955      Check-In   Location [facility]   Staff Present Russella Dar, MS, EP, [facility], Exercise Physiologist;Tamakia Porto Luther Parody, BS, EP, Exercise Physiologist   Supervising physician immediately available to respond to emergencies See telemetry face sheet for immediately available MD   Medication changes reported     No   Fall or balance concerns reported    No   Warm-up and Cool-down Performed as group-led instruction   Resistance Training Performed Yes   VAD Patient? No     Pain Assessment   Currently in Pain? No/denies   Pain Score 0-No pain   Multiple Pain Sites No      Capillary Blood Glucose: No results found for this or any previous visit (from the past 24 hour(s)).    History  Smoking Status  . Former Smoker  . Packs/day: 1.50  . Years: 11.00  . Types: Cigarettes  . Start date: 08/14/1955  . Quit date: 12/15/1966  Smokeless Tobacco  . Never Used    Goals Met:  Independence with exercise equipment Exercise tolerated well No report of cardiac concerns or symptoms Strength training completed today  Goals Unmet:  Not Applicable  Comments: Check out 1030   Dr. Kate Sable is Medical Director for [place] and Pulmonary Rehab.

## 2016-08-23 ENCOUNTER — Encounter (HOSPITAL_COMMUNITY)
Admission: RE | Admit: 2016-08-23 | Discharge: 2016-08-23 | Disposition: A | Discharge status: Home / Self Care | Payer: Medicare Other | Source: Ambulatory Visit | Attending: Cardiovascular Disease | Admitting: Cardiovascular Disease

## 2016-08-23 DIAGNOSIS — Z9889 Other specified postprocedural states: Secondary | ICD-10-CM | POA: Diagnosis not present

## 2016-08-23 DIAGNOSIS — Z951 Presence of aortocoronary bypass graft: Secondary | ICD-10-CM | POA: Diagnosis not present

## 2016-08-23 DIAGNOSIS — Z48812 Encounter for surgical aftercare following surgery on the circulatory system: Secondary | ICD-10-CM | POA: Diagnosis not present

## 2016-08-23 NOTE — Progress Notes (Signed)
Daily Session Note  Patient Details  Name: Corey Riley MRN: [id number] Date of Birth: [birth year] Referring Provider:     [name] from 06/15/2016 in [place]  Referring Provider  Dr. Bronson Ing      Encounter Date: 08/23/2016  Check In:     Session Check In - 08/23/16 0930      Check-In   Location [facility]   Staff Present Russella Dar, MS, EP, [facility], Exercise Physiologist;Gregory Luther Parody, BS, EP, Exercise Physiologist   Supervising physician immediately available to respond to emergencies See telemetry face sheet for immediately available MD   Medication changes reported     No   Fall or balance concerns reported    No   Tobacco Cessation No Change   Warm-up and Cool-down Performed as group-led instruction   Resistance Training Performed Yes   VAD Patient? No     Pain Assessment   Currently in Pain? No/denies   Pain Score 0-No pain   Multiple Pain Sites No      Capillary Blood Glucose: No results found for this or any previous visit (from the past 24 hour(s)).    History  Smoking Status  . Former Smoker  . Packs/day: 1.50  . Years: 11.00  . Types: Cigarettes  . Start date: 08/14/1955  . Quit date: 12/15/1966  Smokeless Tobacco  . Never Used    Goals Met:  Independence with exercise equipment Exercise tolerated well No report of cardiac concerns or symptoms Strength training completed today  Goals Unmet:  Not Applicable  Comments: Check out: 10:30   Dr. Kate Sable is Medical Director for [place] and Pulmonary Rehab.

## 2016-08-24 DIAGNOSIS — Z4822 Encounter for aftercare following kidney transplant: Secondary | ICD-10-CM | POA: Diagnosis not present

## 2016-08-25 ENCOUNTER — Encounter (HOSPITAL_COMMUNITY)
Admission: RE | Admit: 2016-08-25 | Discharge: 2016-08-25 | Disposition: A | Discharge status: Home / Self Care | Payer: Medicare Other | Source: Ambulatory Visit | Attending: Cardiovascular Disease | Admitting: Cardiovascular Disease

## 2016-08-25 DIAGNOSIS — Z9889 Other specified postprocedural states: Secondary | ICD-10-CM | POA: Diagnosis not present

## 2016-08-25 DIAGNOSIS — Z951 Presence of aortocoronary bypass graft: Secondary | ICD-10-CM | POA: Diagnosis not present

## 2016-08-25 DIAGNOSIS — Z48812 Encounter for surgical aftercare following surgery on the circulatory system: Secondary | ICD-10-CM | POA: Diagnosis not present

## 2016-08-25 NOTE — Progress Notes (Signed)
Daily Session Note  Patient Details  Name: Corey Riley MRN: [id number] Date of Birth: 11-Jan-1936 Referring Provider:     [name] from 06/15/2016 in [name]  Referring Provider  Dr. Bronson Ing      Encounter Date: 08/25/2016  Check In:     Session Check In - 08/25/16 0958      Check-In   Location [facility]   Staff Present Russella Dar, MS, EP, [facility], Exercise Physiologist;Mykal Batiz Luther Parody, BS, EP, Exercise Physiologist   Supervising physician immediately available to respond to emergencies See telemetry face sheet for immediately available MD   Medication changes reported     No   Fall or balance concerns reported    No   Warm-up and Cool-down Performed as group-led instruction   Resistance Training Performed Yes   VAD Patient? No     Pain Assessment   Currently in Pain? No/denies   Pain Score 0-No pain   Multiple Pain Sites No      Capillary Blood Glucose: No results found for this or any previous visit (from the past 24 hour(s)).    History  Smoking Status  . Former Smoker  . Packs/day: 1.50  . Years: 11.00  . Types: Cigarettes  . Start date: 08/14/1955  . Quit date: 12/15/1966  Smokeless Tobacco  . Never Used    Goals Met:  Independence with exercise equipment Exercise tolerated well No report of cardiac concerns or symptoms Strength training completed today  Goals Unmet:  Not Applicable  Comments: Check out 1030   Dr. Kate Sable is Medical Director for [place] and Pulmonary Rehab.

## 2016-08-27 ENCOUNTER — Encounter (HOSPITAL_COMMUNITY)
Admission: RE | Admit: 2016-08-27 | Discharge: 2016-08-27 | Disposition: A | Discharge status: Home / Self Care | Payer: Medicare Other | Source: Ambulatory Visit | Attending: Cardiovascular Disease | Admitting: Cardiovascular Disease

## 2016-08-27 DIAGNOSIS — Z951 Presence of aortocoronary bypass graft: Secondary | ICD-10-CM | POA: Diagnosis not present

## 2016-08-27 DIAGNOSIS — Z48812 Encounter for surgical aftercare following surgery on the circulatory system: Secondary | ICD-10-CM | POA: Diagnosis not present

## 2016-08-27 DIAGNOSIS — Z9889 Other specified postprocedural states: Secondary | ICD-10-CM | POA: Diagnosis not present

## 2016-08-27 NOTE — Progress Notes (Signed)
Daily Session Note  Patient Details  Name: JESSEN SIEGMAN MRN: [id number] Date of Birth: 1935/08/11 Referring Provider:     [name] from 06/15/2016 in [place]  Referring Provider  Dr. Bronson Ing      Encounter Date: 08/27/2016  Check In:     Session Check In - 08/27/16 0930      Check-In   Location [facility]   Staff Present Russella Dar, MS, EP, [facility], Exercise Physiologist;Gregory Luther Parody, BS, EP, Exercise Physiologist   Supervising physician immediately available to respond to emergencies See telemetry face sheet for immediately available MD   Medication changes reported     No   Fall or balance concerns reported    No   Tobacco Cessation No Change   Warm-up and Cool-down Performed as group-led instruction   Resistance Training Performed Yes   VAD Patient? No     Pain Assessment   Currently in Pain? No/denies   Pain Score 0-No pain   Multiple Pain Sites No      Capillary Blood Glucose: No results found for this or any previous visit (from the past 24 hour(s)).    History  Smoking Status  . Former Smoker  . Packs/day: 1.50  . Years: 11.00  . Types: Cigarettes  . Start date: 08/14/1955  . Quit date: 12/15/1966  Smokeless Tobacco  . Never Used    Goals Met:  Independence with exercise equipment Exercise tolerated well No report of cardiac concerns or symptoms Strength training completed today  Goals Unmet:  Not Applicable  Comments: Check out: 10:30   Dr. Kate Sable is Medical Director for [place] and Pulmonary Rehab.

## 2016-08-30 ENCOUNTER — Encounter (HOSPITAL_COMMUNITY)
Admission: RE | Admit: 2016-08-30 | Discharge: 2016-08-30 | Disposition: A | Discharge status: Home / Self Care | Payer: Medicare Other | Source: Ambulatory Visit | Attending: Cardiovascular Disease | Admitting: Cardiovascular Disease

## 2016-08-30 DIAGNOSIS — Z48812 Encounter for surgical aftercare following surgery on the circulatory system: Secondary | ICD-10-CM | POA: Diagnosis not present

## 2016-08-30 DIAGNOSIS — Z9889 Other specified postprocedural states: Secondary | ICD-10-CM | POA: Diagnosis not present

## 2016-08-30 DIAGNOSIS — Z951 Presence of aortocoronary bypass graft: Secondary | ICD-10-CM | POA: Diagnosis not present

## 2016-08-30 NOTE — Progress Notes (Signed)
Daily Session Note  Patient Details  Name: Corey Riley MRN: [id number] Date of Birth: [birth year] Referring Provider:     [name] from 06/15/2016 in [place]  Referring Provider  Dr. Bronson Ing      Encounter Date: 08/30/2016  Check In:     Session Check In - 08/30/16 0930      Check-In   Location [facility]   Staff Present Russella Dar, MS, EP, [facility], Exercise Physiologist;Debra Wynetta Emery, RN, BSN   Supervising physician immediately available to respond to emergencies See telemetry face sheet for immediately available MD   Medication changes reported     No   Fall or balance concerns reported    No   Tobacco Cessation No Change   Warm-up and Cool-down Performed as group-led instruction   Resistance Training Performed Yes   VAD Patient? No     Pain Assessment   Currently in Pain? No/denies   Pain Score 0-No pain   Multiple Pain Sites No      Capillary Blood Glucose: No results found for this or any previous visit (from the past 24 hour(s)).    History  Smoking Status  . Former Smoker  . Packs/day: 1.50  . Years: 11.00  . Types: Cigarettes  . Start date: 08/14/1955  . Quit date: 12/15/1966  Smokeless Tobacco  . Never Used    Goals Met:  Independence with exercise equipment Exercise tolerated well No report of cardiac concerns or symptoms Strength training completed today  Goals Unmet:  Not Applicable  Comments: Check out: 1030   Dr. Kate Sable is Medical Director for [place] and Pulmonary Rehab.

## 2016-09-01 ENCOUNTER — Encounter (HOSPITAL_COMMUNITY)
Admission: RE | Admit: 2016-09-01 | Discharge: 2016-09-01 | Disposition: A | Discharge status: Home / Self Care | Payer: Medicare Other | Source: Ambulatory Visit | Attending: Cardiovascular Disease | Admitting: Cardiovascular Disease

## 2016-09-01 DIAGNOSIS — Z9889 Other specified postprocedural states: Secondary | ICD-10-CM | POA: Insufficient documentation

## 2016-09-01 DIAGNOSIS — Z48812 Encounter for surgical aftercare following surgery on the circulatory system: Secondary | ICD-10-CM | POA: Insufficient documentation

## 2016-09-01 DIAGNOSIS — Z951 Presence of aortocoronary bypass graft: Secondary | ICD-10-CM | POA: Diagnosis not present

## 2016-09-01 NOTE — Progress Notes (Signed)
Daily Session Note  Patient Details  Name: Corey Riley MRN: [id number] Date of Birth: Feb 09, 1935 Referring Provider:     [name] from 06/15/2016 in [place]  Referring Provider  Dr. Bronson Ing      Encounter Date: 09/01/2016  Check In:     Session Check In - 09/01/16 0930      Check-In   Location [facility]   Staff Present Russella Dar, MS, EP, [facility], Exercise Physiologist;Debra Wynetta Emery, RN, BSN   Supervising physician immediately available to respond to emergencies See telemetry face sheet for immediately available MD   Medication changes reported     No   Fall or balance concerns reported    No   Tobacco Cessation No Change   Warm-up and Cool-down Performed as group-led instruction   Resistance Training Performed Yes   VAD Patient? No     Pain Assessment   Currently in Pain? No/denies   Pain Score 0-No pain   Multiple Pain Sites No      Capillary Blood Glucose: No results found for this or any previous visit (from the past 24 hour(s)).    History  Smoking Status  . Former Smoker  . Packs/day: 1.50  . Years: 11.00  . Types: Cigarettes  . Start date: 08/14/1955  . Quit date: 12/15/1966  Smokeless Tobacco  . Never Used    Goals Met:  Independence with exercise equipment Exercise tolerated well No report of cardiac concerns or symptoms Strength training completed today  Goals Unmet:  Not Applicable  Comments: Check out 10:30   Dr. Kate Sable is Medical Director for [place] and Pulmonary Rehab.

## 2016-09-03 ENCOUNTER — Encounter (HOSPITAL_COMMUNITY)
Admission: RE | Admit: 2016-09-03 | Discharge: 2016-09-03 | Disposition: A | Discharge status: Home / Self Care | Payer: Medicare Other | Source: Ambulatory Visit | Attending: Cardiovascular Disease | Admitting: Cardiovascular Disease

## 2016-09-03 DIAGNOSIS — Z9889 Other specified postprocedural states: Secondary | ICD-10-CM | POA: Diagnosis not present

## 2016-09-03 DIAGNOSIS — Z951 Presence of aortocoronary bypass graft: Secondary | ICD-10-CM | POA: Diagnosis not present

## 2016-09-03 DIAGNOSIS — Z48812 Encounter for surgical aftercare following surgery on the circulatory system: Secondary | ICD-10-CM | POA: Diagnosis not present

## 2016-09-03 NOTE — Progress Notes (Signed)
Daily Session Note  Patient Details  Name: TILDEN BROZ MRN: [id number] Date of Birth: 05/15/1935 Referring Provider:     [name] from 06/15/2016 in [place]  Referring Provider  Dr. Bronson Ing      Encounter Date: 09/03/2016  Check In:     Session Check In - 09/03/16 0930      Check-In   Location [facility]   Staff Present Aundra Dubin, RN, BSN;Gregory Luther Parody, BS, EP, Exercise Physiologist   Supervising physician immediately available to respond to emergencies See telemetry face sheet for immediately available MD   Medication changes reported     No   Fall or balance concerns reported    No   Tobacco Cessation No Change   Warm-up and Cool-down Performed as group-led instruction   Resistance Training Performed Yes   VAD Patient? No     Pain Assessment   Currently in Pain? No/denies   Pain Score 0-No pain   Multiple Pain Sites No      Capillary Blood Glucose: No results found for this or any previous visit (from the past 24 hour(s)).    History  Smoking Status  . Former Smoker  . Packs/day: 1.50  . Years: 11.00  . Types: Cigarettes  . Start date: 08/14/1955  . Quit date: 12/15/1966  Smokeless Tobacco  . Never Used    Goals Met:  Independence with exercise equipment Exercise tolerated well No report of cardiac concerns or symptoms Strength training completed today  Goals Unmet:  Not Applicable  Comments: Check out 1030.   Dr. Kate Sable is Medical Director for [facility] Cardiac and Pulmonary Rehab.

## 2016-09-06 ENCOUNTER — Encounter (HOSPITAL_COMMUNITY)
Admission: RE | Admit: 2016-09-06 | Discharge: 2016-09-06 | Disposition: A | Discharge status: Home / Self Care | Payer: Medicare Other | Source: Ambulatory Visit | Attending: Cardiovascular Disease | Admitting: Cardiovascular Disease

## 2016-09-06 DIAGNOSIS — Z951 Presence of aortocoronary bypass graft: Secondary | ICD-10-CM

## 2016-09-06 DIAGNOSIS — Z9889 Other specified postprocedural states: Secondary | ICD-10-CM | POA: Diagnosis not present

## 2016-09-06 DIAGNOSIS — Z48812 Encounter for surgical aftercare following surgery on the circulatory system: Secondary | ICD-10-CM | POA: Diagnosis not present

## 2016-09-06 NOTE — Progress Notes (Signed)
Daily Session Note  Patient Details  Name: Corey Riley MRN: [id number] Date of Birth: [birth year] Referring Provider:     [name] from 06/15/2016 in [place]  Referring Provider  Dr. Bronson Ing      Encounter Date: 09/06/2016  Check In:     Session Check In - 09/06/16 0930      Check-In   Location [facility]   Staff Present Aundra Dubin, RN, BSN;Gregory Luther Parody, BS, EP, Exercise Physiologist   Supervising physician immediately available to respond to emergencies See telemetry face sheet for immediately available MD   Medication changes reported     No   Fall or balance concerns reported    No   Tobacco Cessation No Change   Warm-up and Cool-down Performed as group-led instruction   Resistance Training Performed Yes   VAD Patient? No     Pain Assessment   Currently in Pain? No/denies   Pain Score 0-No pain   Multiple Pain Sites No      Capillary Blood Glucose: No results found for this or any previous visit (from the past 24 hour(s)).    History  Smoking Status  . Former Smoker  . Packs/day: 1.50  . Years: 11.00  . Types: Cigarettes  . Start date: 08/14/1955  . Quit date: 12/15/1966  Smokeless Tobacco  . Never Used    Goals Met:  Independence with exercise equipment Exercise tolerated well No report of cardiac concerns or symptoms Strength training completed today  Goals Unmet:  Not Applicable  Comments: Check out 1030.   Dr. Kate Sable is Medical Director for [facility] Cardiac and Pulmonary Rehab.

## 2016-09-06 NOTE — Progress Notes (Signed)
Cardiac Individual Treatment Plan  Patient Details  Name: Corey Riley MRN: [id number] Date of Birth: Aug 29, 1935 Referring Provider:     [name] from 06/15/2016 in [place]  Referring Provider  Dr. Bronson Ing      Initial Encounter Date:    [name] from 06/15/2016 in [place]  Date  06/15/16  Referring Provider  Dr. Bronson Ing      Visit Diagnosis: S/P CABG x 3  Patient's Home Medications on Admission:  Current Outpatient Prescriptions:  .  amLODipine (NORVASC) 5 MG tablet, Take 1 tablet (5 mg total) by mouth daily. (Patient taking differently: Take 10 mg by mouth daily. ), Disp: 30 tablet, Rfl: 6 .  aspirin EC 81 MG tablet, Take 162 mg by mouth daily., Disp: , Rfl:  .  atorvastatin (LIPITOR) 20 MG tablet, Take 20 mg by mouth every Monday, Wednesday, and Friday., Disp: , Rfl:  .  dapsone 25 MG tablet, Take 2 tablets by mouth daily., Disp: , Rfl:  .  docusate sodium (COLACE) 100 MG capsule, Take 100 mg by mouth 2 (two) times daily as needed., Disp: , Rfl:  .  insulin glargine (LANTUS SOLOSTAR) 100 UNIT/ML injection, Inject 18 Units into the skin daily. , Disp: , Rfl:  .  levothyroxine (SYNTHROID, LEVOTHROID) 125 MCG tablet, Take 125 mcg by mouth daily before breakfast., Disp: , Rfl:  .  metoprolol (LOPRESSOR) 50 MG tablet, Take 1 tablet by mouth 2 (two) times daily. Has 100 mg tab currently that he cuts in half, Disp: , Rfl:  .  mycophenolate (MYFORTIC) 180 MG EC tablet, Take 180 mg by mouth 2 (two) times daily. , Disp: , Rfl:  .  nitroGLYCERIN (NITROSTAT) 0.4 MG SL tablet, Place 1 tablet (0.4 mg total) under the tongue every 5 (five) minutes x 3 doses as needed for chest pain. If no relief after 3 rd dose, proceed to the ED for an evaluation, Disp: 25 tablet, Rfl: 3 .  NOVOLOG FLEXPEN 100 UNIT/ML FlexPen, Inject 10 Units into the skin 3 (three) times daily. Plus sliding scale,  Disp: , Rfl: 1 .  omeprazole (PRILOSEC) 20 MG capsule, Take 1 capsule by mouth daily., Disp: , Rfl:  .  oxyCODONE (OXY IR/ROXICODONE) 5 MG immediate release tablet, Take 5 mg by mouth every 4 (four) hours as needed. for pain, Disp: , Rfl: 0 .  polyethylene glycol powder (GLYCOLAX/MIRALAX) powder, Take 17 g by mouth daily as needed., Disp: , Rfl:  .  predniSONE (DELTASONE) 5 MG tablet, Take 5 mg by mouth daily., Disp: , Rfl:  .  simvastatin (ZOCOR) 40 MG tablet, Take 1 tablet by mouth daily., Disp: , Rfl:  .  Tacrolimus 1 MG TB24, Take 2 mg by mouth daily. , Disp: , Rfl:  .  tamsulosin (FLOMAX) 0.4 MG CAPS capsule, Take 1 capsule by mouth at bedtime., Disp: , Rfl: 5 No current facility-administered medications for this encounter.   Facility-Administered Medications Ordered in Other Encounters:  .  cyclopentolate-phenylephrine (CYCLOMYDRYL) 0.2-1 % ophthalmic solution 1 drop, 1 drop, Right Eye, Once, Williams Che, MD .  ketorolac (ACULAR) 0.5 % ophthalmic solution 1 drop, 1 drop, Right Eye, Once, Williams Che, MD  Past Medical History: Past Medical History:  Diagnosis Date  . Anemia   . Anginal pain ([place])    "a little."   . Aortic stenosis    Mild  . Chronic kidney disease    pt  states 30% kidney function started dialysis 04/23/14 T/Th/Sa  . Constipation   . Coronary atherosclerosis of native coronary artery    DES LAD 8/03  . Essential hypertension, benign   . Heart murmur    "faint"  . Hypothyroidism   . Mixed hyperlipidemia   . Pneumonia   . Shortness of breath    with exertion  . Type 2 diabetes mellitus ([facility])     Tobacco Use: History  Smoking Status  . Former Smoker  . Packs/day: 1.50  . Years: 11.00  . Types: Cigarettes  . Start date: 08/14/1955  . Quit date: 12/15/1966  Smokeless Tobacco  . Never Used    Labs: Recent Review Flowsheet Data    There is no flowsheet data to display.      Capillary Blood Glucose: Lab Results  Component Value Date    GLUCAP 279 (H) 08/16/2016   GLUCAP 128 (H) 05/08/2014   GLUCAP 151 (H) 04/22/2014   GLUCAP 136 (H) 04/22/2014   GLUCAP 146 (H) 04/22/2014     Exercise Target Goals:    Exercise Program Goal: Individual exercise prescription set with THRR, safety & activity barriers. Participant demonstrates ability to understand and report RPE using BORG scale, to self-measure pulse accurately, and to acknowledge the importance of the exercise prescription.  Exercise Prescription Goal: Starting with aerobic activity 30 plus minutes a day, 3 days per week for initial exercise prescription. Provide home exercise prescription and guidelines that participant acknowledges understanding prior to discharge.  Activity Barriers & Risk Stratification:   6 Minute Walk:     6 Minute Walk    Row Name 06/15/16 1153         6 Minute Walk   Phase Initial     Distance 1250 feet     Distance % Change 0 %     Walk Time 6 minutes     # of Rest Breaks 0     MPH 2.36     METS 2.81     RPE 11     Perceived Dyspnea  9     VO2 Peak 8.54     Symptoms No     Resting HR 55 bpm     Resting BP 170/62     Max Ex. HR 66 bpm     Max Ex. BP 168/60     2 Minute Post BP 166/64        Oxygen Initial Assessment:   Oxygen Re-Evaluation:   Oxygen Discharge (Final Oxygen Re-Evaluation):   Initial Exercise Prescription:     Initial Exercise Prescription - 06/15/16 1100      Date of Initial Exercise RX and Referring Provider   Date 06/15/16   Referring Provider Dr. Bronson Ing     Treadmill   MPH 2   Grade 0   Minutes 15   METs 2.5     NuStep   Level 2   SPM 17   Minutes 20   METs 1.9     Prescription Details   Frequency (times per week) 3   Duration Progress to 30 minutes of continuous aerobic without signs/symptoms of physical distress     Intensity   THRR 40-80% of Max Heartrate 90-107-125   Ratings of Perceived Exertion 11-13   Perceived Dyspnea 0-4     Progression   Progression  Continue progressive overload as per policy without signs/symptoms or physical distress.     Resistance Training   Training Prescription Yes   Weight 1  Reps 10-15      Perform Capillary Blood Glucose checks as needed.  Exercise Prescription Changes:      Exercise Prescription Changes    Row Name 06/21/16 1400 07/05/16 1400 07/15/16 1500 08/02/16 1500 08/25/16 1400     Response to Exercise   Blood Pressure (Admit) 146/58 160/50 160/50 162/62 158/62   Blood Pressure (Exercise) 150/50 150/50 166/60 170/82 168/68   Blood Pressure (Exit) 122/62 130/50 146/50 136/70 134/62   Heart Rate (Admit) 53 bpm 56 bpm 48 bpm 51 bpm 53 bpm   Heart Rate (Exercise) 70 bpm 67 bpm 69 bpm 64 bpm 75 bpm   Heart Rate (Exit) 62 bpm 61 bpm 60 bpm 56 bpm 67 bpm   Rating of Perceived Exertion (Exercise) _0 Duration Progress to 30 minutes of  aerobic without signs/symptoms of physical distress Progress to 30 minutes of  aerobic without signs/symptoms of physical distress Progress to 30 minutes of  aerobic without signs/symptoms of physical distress Progress to 30 minutes of  aerobic without signs/symptoms of physical distress Progress to 30 minutes of  aerobic without signs/symptoms of physical distress   Intensity _1      Progression   Progression Continue to progress workloads to maintain intensity without signs/symptoms of physical distress. Continue to progress workloads to maintain intensity without signs/symptoms of physical distress. Continue to progress workloads to maintain intensity without signs/symptoms of physical distress. Continue to progress workloads to maintain intensity without signs/symptoms of physical distress. Continue to progress workloads to maintain intensity without signs/symptoms of physical distress.     Resistance Training   Training Prescription _2    Weight _3 Reps  10-15 10-15 10-15 10-15 10-15     Treadmill   MPH 2 2 2.1 2.3 2.4   Grade 0 0 0 0 0   Minutes _4 METs 2.5 2.5 2.6 2.7 2.8     NuStep   Level _5 SPM _6 32   Minutes _7 METs 1.9 3.51 3.5 2.2 2.4     Home Exercise Plan   Plans to continue exercise at Home (comment) Home (comment) Home (comment) Home (comment) Home (comment)   Frequency Add 2 additional days to program exercise sessions. Add 2 additional days to program exercise sessions. Add 2 additional days to program exercise sessions. Add 2 additional days to program exercise sessions. Add 2 additional days to program exercise sessions.      Exercise Comments:      Exercise Comments    Row Name 06/21/16 1454 07/05/16 1432 07/15/16 1530 08/02/16 1501 08/25/16 1430   Exercise Comments Patient has just started CR and will be progressed in time.  Patient is doing well in CR. He has not increased in level but has increased in watts.  Patient is doing well in CR.  Patient is doing well in CR.  Patient is doing well in CR.       Exercise Goals and Review:      Exercise Goals    Row Name 06/15/16 1257             Exercise Goals   Increase Physical Activity Yes       Intervention Provide advice, education, support and counseling about physical activity/exercise needs.;Develop an individualized exercise prescription  for aerobic and resistive training based on initial evaluation findings, risk stratification, comorbidities and participant's personal goals.       Expected Outcomes Achievement of increased cardiorespiratory fitness and enhanced flexibility, muscular endurance and strength shown through measurements of functional capacity and personal statement of participant.       Increase Strength and Stamina Yes       Intervention Provide advice, education, support and counseling about physical activity/exercise needs.;Develop an individualized exercise prescription for aerobic and  resistive training based on initial evaluation findings, risk stratification, comorbidities and participant's personal goals.       Expected Outcomes Achievement of increased cardiorespiratory fitness and enhanced flexibility, muscular endurance and strength shown through measurements of functional capacity and personal statement of participant.          Exercise Goals Re-Evaluation :     Exercise Goals Re-Evaluation    Row Name 07/21/16 1427 08/11/16 1302 09/06/16 1500         Exercise Goal Re-Evaluation   Exercise Goals Review Increase Physical Activity;Increase Strenth and Stamina Increase Physical Activity;Increase Strenth and Stamina Increase Physical Activity;Increase Strenth and Stamina     Comments Patient has completed 8 sessions with some progression. He says he feels stronger since starting the program. Will continue to monitor for progress.  Patient has completed 15 sessions with increased strength and stamina. He says his legs and arms feel stronger and he feels better overall with more energy.  Patient has completed 22 sessions. He continues to progress well in the program with increased treadmill speeds. He says he feels stronger and has more endurance. Will continue to monitor.      Expected Outcomes Patient will complete the program with increased strength, stamina, and activity. Patient will complete the program with continued increased strength, stamina, and activity. Patient will complete the program with continued increased strength, stamina, and activity.          Discharge Exercise Prescription (Final Exercise Prescription Changes):     Exercise Prescription Changes - 08/25/16 1400      Response to Exercise   Blood Pressure (Admit) 158/62   Blood Pressure (Exercise) 168/68   Blood Pressure (Exit) 134/62   Heart Rate (Admit) 53 bpm   Heart Rate (Exercise) 75 bpm   Heart Rate (Exit) 67 bpm   Rating of Perceived Exertion (Exercise) 10   Duration Progress to 30  minutes of  aerobic without signs/symptoms of physical distress   Intensity THRR unchanged     Progression   Progression Continue to progress workloads to maintain intensity without signs/symptoms of physical distress.     Resistance Training   Training Prescription Yes   Weight 3   Reps 10-15     Treadmill   MPH 2.4   Grade 0   Minutes 15   METs 2.8     NuStep   Level 4   SPM 32   Minutes 20   METs 2.4     Home Exercise Plan   Plans to continue exercise at Home (comment)   Frequency Add 2 additional days to program exercise sessions.      Nutrition:  Target Goals: Understanding of nutrition guidelines, daily intake of sodium <1542m, cholesterol <2056m calories 30% from fat and 7% or less from saturated fats, daily to have 5 or more servings of fruits and vegetables.  Biometrics:     Pre Biometrics - 06/15/16 1155      Pre Biometrics   Height _0  (1.753 m)  Weight 166 lb 7.2 oz (75.5 kg)   Waist Circumference 34 inches   Hip Circumference 37 inches   Waist to Hip Ratio 0.92 %   BMI (Calculated) 24.6   Triceps Skinfold 13 mm   % Body Fat 23.2 %   Grip Strength 59.13 kg   Flexibility 0 in  Test was not available   Single Leg Stand 3 seconds       Nutrition Therapy Plan and Nutrition Goals:   Nutrition Discharge: Rate Your Plate Scores:   Nutrition Goals Re-Evaluation:   Nutrition Goals Discharge (Final Nutrition Goals Re-Evaluation):   Psychosocial: Target Goals: Acknowledge presence or absence of significant depression and/or stress, maximize coping skills, provide positive support system. Participant is able to verbalize types and ability to use techniques and skills needed for reducing stress and depression.  Initial Review & Psychosocial Screening:     Initial Psych Review & Screening - 06/15/16 1300      Initial Review   Current issues with None Identified     Family Dynamics   Good Support System? Yes     Barriers    Psychosocial barriers to participate in program There are no identifiable barriers or psychosocial needs.     Screening Interventions   Interventions Encouraged to exercise      Quality of Life Scores:     Quality of Life - 06/15/16 1156      Quality of Life Scores   Health/Function Pre 22.57 %   Socioeconomic Pre 24.92 %   Psych/Spiritual Pre 25.93 %   Family Pre 28.3 %   GLOBAL Pre 24.58 %      PHQ-9: Recent Review Flowsheet Data    Depression screen [facility] 2/9 06/15/2016   Decreased Interest 0   Down, Depressed, Hopeless 0   PHQ - 2 Score 0   Altered sleeping 0   Tired, decreased energy 1   Change in appetite 0   Feeling bad or failure about yourself  0   Trouble concentrating 0   Moving slowly or fidgety/restless 0   Suicidal thoughts 0   PHQ-9 Score 1   Difficult doing work/chores Not difficult at all     Interpretation of Total Score  Total Score Depression Severity:  1-4 = Minimal depression, 5-9 = Mild depression, 10-14 = Moderate depression, 15-19 = Moderately severe depression, 20-27 = Severe depression   Psychosocial Evaluation and Intervention:     Psychosocial Evaluation - 06/15/16 1300      Psychosocial Evaluation & Interventions   Interventions Encouraged to exercise with the program and follow exercise prescription   Continue Psychosocial Services  No Follow up required      Psychosocial Re-Evaluation:     Psychosocial Re-Evaluation    Row Name 07/21/16 1429 08/11/16 1303 09/06/16 1502         Psychosocial Re-Evaluation   Current issues with None Identified None Identified None Identified     Expected Outcomes Patient will have no psychosocial issues identified at discharge.  Patient will have no psychosocial barriers identified at discharge.  Patient will have no psychosocial barriers identified at discharge.      Interventions Encouraged to attend Cardiac Rehabilitation for the exercise Encouraged to attend Cardiac Rehabilitation for the  exercise Encouraged to attend Cardiac Rehabilitation for the exercise;Stress management education;Relaxation education     Continue Psychosocial Services  No Follow up required No Follow up required No Follow up required        Psychosocial Discharge (Final Psychosocial Re-Evaluation):  Psychosocial Re-Evaluation - 09/06/16 1502      Psychosocial Re-Evaluation   Current issues with None Identified   Expected Outcomes Patient will have no psychosocial barriers identified at discharge.    Interventions Encouraged to attend Cardiac Rehabilitation for the exercise;Stress management education;Relaxation education   Continue Psychosocial Services  No Follow up required      Vocational Rehabilitation: Provide vocational rehab assistance to qualifying candidates.   Vocational Rehab Evaluation & Intervention:     Vocational Rehab - 06/15/16 1254      Initial Vocational Rehab Evaluation & Intervention   Assessment shows need for Vocational Rehabilitation No      Education: Education Goals: Education classes will be provided on a weekly basis, covering required topics. Participant will state understanding/return demonstration of topics presented.  Learning Barriers/Preferences:     Learning Barriers/Preferences - 06/15/16 1251      Learning Barriers/Preferences   Learning Barriers None   Learning Preferences Computer/Internet;Written Material;Pictoral      Education Topics: Hypertension, Hypertension Reduction -Define heart disease and high blood pressure. Discus how high blood pressure affects the body and ways to reduce high blood pressure.   CARDIAC REHAB PHASE II EXERCISE from 09/01/2016 in [place]  Date  09/01/16  Educator  DC  Instruction Review Code  2- meets goals/outcomes      Exercise and Your Heart -Discuss why it is important to exercise, the FITT principles of exercise, normal and abnormal responses to exercise, and how to exercise  safely.   Angina -Discuss definition of angina, causes of angina, treatment of angina, and how to decrease risk of having angina.   Cardiac Medications -Review what the following cardiac medications are used for, how they affect the body, and side effects that may occur when taking the medications.  Medications include Aspirin, Beta blockers, calcium channel blockers, ACE Inhibitors, angiotensin receptor blockers, diuretics, digoxin, and antihyperlipidemics.   CARDIAC REHAB PHASE II EXERCISE from 09/01/2016 in [place]  Date  06/23/16  Educator  DJ  Instruction Review Code  2- meets goals/outcomes      Congestive Heart Failure -Discuss the definition of CHF, how to live with CHF, the signs and symptoms of CHF, and how keep track of weight and sodium intake.   CARDIAC REHAB PHASE II EXERCISE from 09/01/2016 in [place]  Date  06/30/16  Educator  DC  Instruction Review Code  2- meets goals/outcomes      Heart Disease and Intimacy -Discus the effect sexual activity has on the heart, how changes occur during intimacy as we age, and safety during sexual activity.   Smoking Cessation / COPD -Discuss different methods to quit smoking, the health benefits of quitting smoking, and the definition of COPD.   CARDIAC REHAB PHASE II EXERCISE from 09/01/2016 in [place]  Date  07/14/16  Educator  Russella Dar  Instruction Review Code  2- meets goals/outcomes      Nutrition I: Fats -Discuss the types of cholesterol, what cholesterol does to the heart, and how cholesterol levels can be controlled.   Nutrition II: Labels -Discuss the different components of food labels and how to read food label   CARDIAC REHAB PHASE II EXERCISE from 09/01/2016 in [place]  Date  07/28/16  Educator  DC  Instruction Review Code  2- meets goals/outcomes      Heart Parts and Heart Disease -Discuss the anatomy of  the heart, the pathway of blood  circulation through the heart, and these are affected by heart disease.   CARDIAC REHAB PHASE II EXERCISE from 09/01/2016 in [place]  Date  08/06/16  Educator  DC  Instruction Review Code  2- meets goals/outcomes      Stress I: Signs and Symptoms -Discuss the causes of stress, how stress may lead to anxiety and depression, and ways to limit stress.   CARDIAC REHAB PHASE II EXERCISE from 09/01/2016 in [place]  Date  08/11/16  Educator  [place]  Instruction Review Code  2- meets goals/outcomes      Stress II: Relaxation -Discuss different types of relaxation techniques to limit stress.   CARDIAC REHAB PHASE II EXERCISE from 09/01/2016 in [place]  Date  08/18/16  Educator  [place]  Instruction Review Code  2- meets goals/outcomes      Warning Signs of Stroke / TIA -Discuss definition of a stroke, what the signs and symptoms are of a stroke, and how to identify when someone is having stroke.   CARDIAC REHAB PHASE II EXERCISE from 09/01/2016 in [place]  Date  08/25/16  Educator  DC  Instruction Review Code  2- meets goals/outcomes      Knowledge Questionnaire Score:     Knowledge Questionnaire Score - 06/15/16 1254      Knowledge Questionnaire Score   Pre Score 19/24      Core Components/Risk Factors/Patient Goals at Admission:     Personal Goals and Risk Factors at Admission - 06/15/16 1257      Core Components/Risk Factors/Patient Goals on Admission    Weight Management Weight Maintenance   Improve shortness of breath with ADL's Yes   Intervention Provide education, individualized exercise plan and daily activity instruction to help decrease symptoms of SOB with activities of daily living.   Expected Outcomes Short Term: Achieves a reduction of symptoms when performing activities of daily living.   Personal Goal Other Yes   Personal Goal Get  stonger and increase endurance.    Intervention Attend 3 x week in CR and supplement with 2 x week exercise at home.    Expected Outcomes Achieve personal goals.       Core Components/Risk Factors/Patient Goals Review:      Goals and Risk Factor Review    Row Name 06/15/16 1259 07/21/16 1425 08/11/16 1258 09/06/16 1457       Core Components/Risk Factors/Patient Goals Review   Personal Goals Review Diabetes;Improve shortness of breath with ADL's Weight Management/Obesity  Get stronger; increase endurance.  Weight Management/Obesity;Diabetes  Get stronger; increase endurance.  Weight Management/Obesity;Diabetes;Improve shortness of breath with ADL's  Get stronger; increase endurance.     Review  - Patient has completed 8 sessions gaining 6 lbs. He has been out of town for 2 weeks which has impeded his progression. He says he feels stronger. Will continue to monitor for progress.  Patient has completed 15 sessions gaining 10 lbs. His had no HGB A1C on file. He is progressing well in the program saying he feels better overall. He attributes his weight gain to overeating. He feels the program is helping him meet his goals.  Patient has completed 26 sessions gaining 11.6 lbs. He has doing well in the program and attributes his weight gain to eating too much.  He does not have an A1C on file. His reported fasting glucose readings are usualy WNL. He says the program is helping him meet his goals and he does  feel stronger. Will continue to monitor.     Expected Outcomes  - Patient will continue to attend sessions and work toward meeting his goals.  Patient will complete the program meeting his personal goals.  Patient will complete the program and continues to meet his personal goals.        Core Components/Risk Factors/Patient Goals at Discharge (Final Review):      Goals and Risk Factor Review - 09/06/16 1457      Core Components/Risk Factors/Patient Goals Review   Personal Goals Review Weight  Management/Obesity;Diabetes;Improve shortness of breath with ADL's  Get stronger; increase endurance.    Review Patient has completed 26 sessions gaining 11.6 lbs. He has doing well in the program and attributes his weight gain to eating too much.  He does not have an A1C on file. His reported fasting glucose readings are usualy WNL. He says the program is helping him meet his goals and he does feel stronger. Will continue to monitor.    Expected Outcomes Patient will complete the program and continues to meet his personal goals.       ITP Comments:     ITP Comments    Row Name 06/15/16 1255 06/23/16 0751 08/05/16 1550       ITP Comments Mr. Marczak is a pleasant 52 yr. old male. He has had a recent (R) kidney transplant and prostate removed. He is doing well.  Patient new to program. He has compeleted 2 sessions. Will continue to monitor for progress.  Patient met with Registered Dietitian to discuss nutrition topics including: Heart healthty eating, heart healthy cooking and making smart choices when shopping; Portion control; weight management; and hydration.        Comments: ITP 30 Day REVIEW Patient doing well in the program. Will continue to monitor for progress.

## 2016-09-08 ENCOUNTER — Encounter (HOSPITAL_COMMUNITY): Payer: Medicare Other

## 2016-09-09 DIAGNOSIS — Z9079 Acquired absence of other genital organ(s): Secondary | ICD-10-CM | POA: Diagnosis not present

## 2016-09-09 DIAGNOSIS — Z792 Long term (current) use of antibiotics: Secondary | ICD-10-CM | POA: Diagnosis not present

## 2016-09-09 DIAGNOSIS — N390 Urinary tract infection, site not specified: Secondary | ICD-10-CM | POA: Diagnosis not present

## 2016-09-09 DIAGNOSIS — E039 Hypothyroidism, unspecified: Secondary | ICD-10-CM | POA: Diagnosis not present

## 2016-09-09 DIAGNOSIS — D899 Disorder involving the immune mechanism, unspecified: Secondary | ICD-10-CM | POA: Diagnosis not present

## 2016-09-09 DIAGNOSIS — I10 Essential (primary) hypertension: Secondary | ICD-10-CM | POA: Diagnosis not present

## 2016-09-09 DIAGNOSIS — D649 Anemia, unspecified: Secondary | ICD-10-CM | POA: Diagnosis not present

## 2016-09-09 DIAGNOSIS — Z940 Kidney transplant status: Secondary | ICD-10-CM | POA: Diagnosis not present

## 2016-09-09 DIAGNOSIS — I120 Hypertensive chronic kidney disease with stage 5 chronic kidney disease or end stage renal disease: Secondary | ICD-10-CM | POA: Diagnosis not present

## 2016-09-09 DIAGNOSIS — Z951 Presence of aortocoronary bypass graft: Secondary | ICD-10-CM | POA: Diagnosis not present

## 2016-09-09 DIAGNOSIS — Z79899 Other long term (current) drug therapy: Secondary | ICD-10-CM | POA: Diagnosis not present

## 2016-09-09 DIAGNOSIS — Z7952 Long term (current) use of systemic steroids: Secondary | ICD-10-CM | POA: Diagnosis not present

## 2016-09-09 DIAGNOSIS — N186 End stage renal disease: Secondary | ICD-10-CM | POA: Diagnosis not present

## 2016-09-09 DIAGNOSIS — E119 Type 2 diabetes mellitus without complications: Secondary | ICD-10-CM | POA: Diagnosis not present

## 2016-09-09 DIAGNOSIS — D8989 Other specified disorders involving the immune mechanism, not elsewhere classified: Secondary | ICD-10-CM | POA: Diagnosis not present

## 2016-09-09 DIAGNOSIS — Z7982 Long term (current) use of aspirin: Secondary | ICD-10-CM | POA: Diagnosis not present

## 2016-09-09 DIAGNOSIS — N320 Bladder-neck obstruction: Secondary | ICD-10-CM | POA: Diagnosis not present

## 2016-09-09 DIAGNOSIS — Z794 Long term (current) use of insulin: Secondary | ICD-10-CM | POA: Diagnosis not present

## 2016-09-09 DIAGNOSIS — D631 Anemia in chronic kidney disease: Secondary | ICD-10-CM | POA: Diagnosis not present

## 2016-09-09 DIAGNOSIS — Z87891 Personal history of nicotine dependence: Secondary | ICD-10-CM | POA: Diagnosis not present

## 2016-09-09 DIAGNOSIS — R1031 Right lower quadrant pain: Secondary | ICD-10-CM | POA: Diagnosis not present

## 2016-09-09 DIAGNOSIS — I2510 Atherosclerotic heart disease of native coronary artery without angina pectoris: Secondary | ICD-10-CM | POA: Diagnosis not present

## 2016-09-09 DIAGNOSIS — M791 Myalgia: Secondary | ICD-10-CM | POA: Diagnosis not present

## 2016-09-09 DIAGNOSIS — N401 Enlarged prostate with lower urinary tract symptoms: Secondary | ICD-10-CM | POA: Diagnosis not present

## 2016-09-09 DIAGNOSIS — E1122 Type 2 diabetes mellitus with diabetic chronic kidney disease: Secondary | ICD-10-CM | POA: Diagnosis not present

## 2016-09-09 DIAGNOSIS — Z4822 Encounter for aftercare following kidney transplant: Secondary | ICD-10-CM | POA: Diagnosis not present

## 2016-09-09 DIAGNOSIS — N400 Enlarged prostate without lower urinary tract symptoms: Secondary | ICD-10-CM | POA: Diagnosis not present

## 2016-09-09 DIAGNOSIS — E785 Hyperlipidemia, unspecified: Secondary | ICD-10-CM | POA: Diagnosis not present

## 2016-09-10 ENCOUNTER — Encounter (HOSPITAL_COMMUNITY)
Admission: RE | Admit: 2016-09-10 | Discharge: 2016-09-10 | Disposition: A | Discharge status: Home / Self Care | Payer: Medicare Other | Source: Ambulatory Visit | Attending: Cardiovascular Disease | Admitting: Cardiovascular Disease

## 2016-09-10 DIAGNOSIS — Z9889 Other specified postprocedural states: Secondary | ICD-10-CM | POA: Diagnosis not present

## 2016-09-10 DIAGNOSIS — Z951 Presence of aortocoronary bypass graft: Secondary | ICD-10-CM | POA: Diagnosis not present

## 2016-09-10 DIAGNOSIS — Z48812 Encounter for surgical aftercare following surgery on the circulatory system: Secondary | ICD-10-CM | POA: Diagnosis not present

## 2016-09-10 NOTE — Progress Notes (Signed)
Daily Session Note  Patient Details  Name: Corey Riley MRN: [id number] Date of Birth: 10/17/1935 Referring Provider:     [name] from 06/15/2016 in [place]  Referring Provider  Dr. Bronson Ing      Encounter Date: 09/10/2016  Check In:     Session Check In - 09/10/16 0923      Check-In   Location [facility]   Staff Present Russella Dar, MS, EP, [facility], Exercise Physiologist;Alizay Bronkema Luther Parody, BS, EP, Exercise Physiologist   Supervising physician immediately available to respond to emergencies See telemetry face sheet for immediately available MD   Medication changes reported     No   Fall or balance concerns reported    No   Warm-up and Cool-down Performed as group-led instruction   Resistance Training Performed Yes   VAD Patient? No     Pain Assessment   Currently in Pain? No/denies   Pain Score 0-No pain   Multiple Pain Sites No      Capillary Blood Glucose: No results found for this or any previous visit (from the past 24 hour(s)).    History  Smoking Status  . Former Smoker  . Packs/day: 1.50  . Years: 11.00  . Types: Cigarettes  . Start date: 08/14/1955  . Quit date: 12/15/1966  Smokeless Tobacco  . Never Used    Goals Met:  Independence with exercise equipment Exercise tolerated well No report of cardiac concerns or symptoms Strength training completed today  Goals Unmet:  Not Applicable  Comments: Check out 1030   Dr. Kate Sable is Medical Director for [place] and Pulmonary Rehab.

## 2016-09-13 ENCOUNTER — Encounter (HOSPITAL_COMMUNITY)
Admission: RE | Admit: 2016-09-13 | Discharge: 2016-09-13 | Disposition: A | Discharge status: Home / Self Care | Payer: Medicare Other | Source: Ambulatory Visit | Attending: Cardiovascular Disease | Admitting: Cardiovascular Disease

## 2016-09-13 DIAGNOSIS — Z9889 Other specified postprocedural states: Secondary | ICD-10-CM | POA: Diagnosis not present

## 2016-09-13 DIAGNOSIS — Z951 Presence of aortocoronary bypass graft: Secondary | ICD-10-CM | POA: Diagnosis not present

## 2016-09-13 DIAGNOSIS — Z48812 Encounter for surgical aftercare following surgery on the circulatory system: Secondary | ICD-10-CM | POA: Diagnosis not present

## 2016-09-14 DIAGNOSIS — K219 Gastro-esophageal reflux disease without esophagitis: Secondary | ICD-10-CM | POA: Diagnosis not present

## 2016-09-14 DIAGNOSIS — E1165 Type 2 diabetes mellitus with hyperglycemia: Secondary | ICD-10-CM | POA: Diagnosis not present

## 2016-09-14 DIAGNOSIS — E7800 Pure hypercholesterolemia, unspecified: Secondary | ICD-10-CM | POA: Diagnosis not present

## 2016-09-14 DIAGNOSIS — N189 Chronic kidney disease, unspecified: Secondary | ICD-10-CM | POA: Diagnosis not present

## 2016-09-14 DIAGNOSIS — E875 Hyperkalemia: Secondary | ICD-10-CM | POA: Diagnosis not present

## 2016-09-14 DIAGNOSIS — I10 Essential (primary) hypertension: Secondary | ICD-10-CM | POA: Diagnosis not present

## 2016-09-14 DIAGNOSIS — D519 Vitamin B12 deficiency anemia, unspecified: Secondary | ICD-10-CM | POA: Diagnosis not present

## 2016-09-14 DIAGNOSIS — E039 Hypothyroidism, unspecified: Secondary | ICD-10-CM | POA: Diagnosis not present

## 2016-09-15 ENCOUNTER — Encounter (HOSPITAL_COMMUNITY)
Admission: RE | Admit: 2016-09-15 | Discharge: 2016-09-15 | Disposition: A | Discharge status: Home / Self Care | Payer: Medicare Other | Source: Ambulatory Visit | Attending: Cardiovascular Disease | Admitting: Cardiovascular Disease

## 2016-09-15 DIAGNOSIS — Z48812 Encounter for surgical aftercare following surgery on the circulatory system: Secondary | ICD-10-CM | POA: Diagnosis not present

## 2016-09-15 DIAGNOSIS — Z951 Presence of aortocoronary bypass graft: Secondary | ICD-10-CM

## 2016-09-15 DIAGNOSIS — Z9889 Other specified postprocedural states: Secondary | ICD-10-CM | POA: Diagnosis not present

## 2016-09-15 NOTE — Progress Notes (Signed)
Daily Session Note  Patient Details  Name: Corey Riley MRN: [id number] Date of Birth: November 19, 1935 Referring Provider:     [name] from 06/15/2016 in [place]  Referring Provider  Dr. Bronson Ing      Encounter Date: 09/15/2016  Check In:     Session Check In - 09/15/16 0930      Check-In   Location [facility]   Staff Present Aundra Dubin, RN, BSN;Gregory Luther Parody, BS, EP, Exercise Physiologist   Supervising physician immediately available to respond to emergencies See telemetry face sheet for immediately available MD   Medication changes reported     No   Fall or balance concerns reported    No   Tobacco Cessation No Change   Warm-up and Cool-down Performed as group-led instruction   Resistance Training Performed Yes   VAD Patient? No     Pain Assessment   Currently in Pain? No/denies   Pain Score 0-No pain   Multiple Pain Sites No      Capillary Blood Glucose: No results found for this or any previous visit (from the past 24 hour(s)).    History  Smoking Status  . Former Smoker  . Packs/day: 1.50  . Years: 11.00  . Types: Cigarettes  . Start date: 08/14/1955  . Quit date: 12/15/1966  Smokeless Tobacco  . Never Used    Goals Met:  Independence with exercise equipment Exercise tolerated well No report of cardiac concerns or symptoms Strength training completed today  Goals Unmet:  Not Applicable  Comments: Check out 1030.   Dr. Kate Sable is Medical Director for [facility] Cardiac and Pulmonary Rehab.

## 2016-09-16 DIAGNOSIS — Z6824 Body mass index (BMI) 24.0-24.9, adult: Secondary | ICD-10-CM | POA: Diagnosis not present

## 2016-09-16 DIAGNOSIS — E1165 Type 2 diabetes mellitus with hyperglycemia: Secondary | ICD-10-CM | POA: Diagnosis not present

## 2016-09-16 DIAGNOSIS — Z0001 Encounter for general adult medical examination with abnormal findings: Secondary | ICD-10-CM | POA: Diagnosis not present

## 2016-09-16 DIAGNOSIS — N189 Chronic kidney disease, unspecified: Secondary | ICD-10-CM | POA: Diagnosis not present

## 2016-09-16 DIAGNOSIS — E039 Hypothyroidism, unspecified: Secondary | ICD-10-CM | POA: Diagnosis not present

## 2016-09-16 DIAGNOSIS — Z940 Kidney transplant status: Secondary | ICD-10-CM | POA: Diagnosis not present

## 2016-09-17 ENCOUNTER — Encounter (HOSPITAL_COMMUNITY)
Admission: RE | Admit: 2016-09-17 | Discharge: 2016-09-17 | Disposition: A | Discharge status: Home / Self Care | Payer: Medicare Other | Source: Ambulatory Visit | Attending: Cardiovascular Disease | Admitting: Cardiovascular Disease

## 2016-09-17 DIAGNOSIS — Z48812 Encounter for surgical aftercare following surgery on the circulatory system: Secondary | ICD-10-CM | POA: Diagnosis not present

## 2016-09-17 DIAGNOSIS — Z951 Presence of aortocoronary bypass graft: Secondary | ICD-10-CM | POA: Diagnosis not present

## 2016-09-17 DIAGNOSIS — Z9889 Other specified postprocedural states: Secondary | ICD-10-CM | POA: Diagnosis not present

## 2016-09-17 NOTE — Progress Notes (Signed)
Daily Session Note  Patient Details  Name: Corey Riley MRN: [id number] Date of Birth: Aug 10, 1935 Referring Provider:     [name] from 06/15/2016 in [place]  Referring Provider  Dr. Bronson Ing      Encounter Date: 09/17/2016  Check In:     Session Check In - 09/17/16 0930      Check-In   Staff Present Aundra Dubin, RN, BSN;Gregory Luther Parody, BS, EP, Exercise Physiologist   Supervising physician immediately available to respond to emergencies See telemetry face sheet for immediately available MD   Medication changes reported     No   Fall or balance concerns reported    No   Warm-up and Cool-down Performed as group-led instruction   Resistance Training Performed Yes   VAD Patient? No     Pain Assessment   Currently in Pain? No/denies   Pain Score 0-No pain   Multiple Pain Sites No      Capillary Blood Glucose: No results found for this or any previous visit (from the past 24 hour(s)).    History  Smoking Status  . Former Smoker  . Packs/day: 1.50  . Years: 11.00  . Types: Cigarettes  . Start date: 08/14/1955  . Quit date: 12/15/1966  Smokeless Tobacco  . Never Used    Goals Met:  Independence with exercise equipment Exercise tolerated well No report of cardiac concerns or symptoms Strength training completed today  Goals Unmet:  Not Applicable  Comments: Check out 1030.   Dr. Kate Sable is Medical Director for [facility] Cardiac and Pulmonary Rehab.

## 2016-09-20 ENCOUNTER — Encounter (HOSPITAL_COMMUNITY)
Admission: RE | Admit: 2016-09-20 | Discharge: 2016-09-20 | Disposition: A | Discharge status: Home / Self Care | Payer: Medicare Other | Source: Ambulatory Visit | Attending: Cardiovascular Disease | Admitting: Cardiovascular Disease

## 2016-09-20 DIAGNOSIS — Z951 Presence of aortocoronary bypass graft: Secondary | ICD-10-CM

## 2016-09-20 DIAGNOSIS — Z48812 Encounter for surgical aftercare following surgery on the circulatory system: Secondary | ICD-10-CM | POA: Diagnosis not present

## 2016-09-20 DIAGNOSIS — Z9889 Other specified postprocedural states: Secondary | ICD-10-CM | POA: Diagnosis not present

## 2016-09-20 NOTE — Progress Notes (Signed)
Daily Session Note  Patient Details  Name: REINER LOEWEN MRN: [id number] Date of Birth: 1935-11-12 Referring Provider:     [name] from 06/15/2016 in [place]  Referring Provider  Dr. Bronson Ing      Encounter Date: 09/20/2016  Check In:     Session Check In - 09/20/16 0930      Check-In   Location [facility]   Staff Present Aundra Dubin, RN, BSN;Gregory Luther Parody, BS, EP, Exercise Physiologist   Supervising physician immediately available to respond to emergencies See telemetry face sheet for immediately available MD   Medication changes reported     No   Fall or balance concerns reported    No   Warm-up and Cool-down Performed as group-led instruction   Resistance Training Performed Yes   VAD Patient? No     Pain Assessment   Currently in Pain? No/denies   Pain Score 0-No pain   Multiple Pain Sites No      Capillary Blood Glucose: No results found for this or any previous visit (from the past 24 hour(s)).    History  Smoking Status  . Former Smoker  . Packs/day: 1.50  . Years: 11.00  . Types: Cigarettes  . Start date: 08/14/1955  . Quit date: 12/15/1966  Smokeless Tobacco  . Never Used    Goals Met:  Independence with exercise equipment Exercise tolerated well No report of cardiac concerns or symptoms Strength training completed today  Goals Unmet:  Not Applicable  Comments: Check out 1030.   Dr. Kate Sable is Medical Director for [facility] Cardiac and Pulmonary Rehab.

## 2016-09-22 ENCOUNTER — Encounter (HOSPITAL_COMMUNITY)
Admission: RE | Admit: 2016-09-22 | Discharge: 2016-09-22 | Disposition: A | Discharge status: Home / Self Care | Payer: Medicare Other | Source: Ambulatory Visit | Attending: Cardiovascular Disease | Admitting: Cardiovascular Disease

## 2016-09-22 DIAGNOSIS — Z48812 Encounter for surgical aftercare following surgery on the circulatory system: Secondary | ICD-10-CM | POA: Diagnosis not present

## 2016-09-22 DIAGNOSIS — Z9889 Other specified postprocedural states: Secondary | ICD-10-CM | POA: Diagnosis not present

## 2016-09-22 DIAGNOSIS — Z951 Presence of aortocoronary bypass graft: Secondary | ICD-10-CM | POA: Diagnosis not present

## 2016-09-22 NOTE — Progress Notes (Signed)
Daily Session Note  Patient Details  Name: Corey Riley MRN: [id number] Date of Birth: December 18, 1935 Referring Provider:     [name] from 06/15/2016 in [place]  Referring Provider  Dr. Bronson Ing      Encounter Date: 09/22/2016  Check In:     Session Check In - 09/22/16 0930      Check-In   Location [facility]   Staff Present Aundra Dubin, RN, BSN;Gregory Luther Parody, BS, EP, Exercise Physiologist   Supervising physician immediately available to respond to emergencies See telemetry face sheet for immediately available MD   Medication changes reported     No   Fall or balance concerns reported    No   Tobacco Cessation No Change   Warm-up and Cool-down Performed as group-led instruction   Resistance Training Performed Yes   VAD Patient? No     Pain Assessment   Currently in Pain? No/denies   Pain Score 0-No pain   Multiple Pain Sites No      Capillary Blood Glucose: No results found for this or any previous visit (from the past 24 hour(s)).    History  Smoking Status  . Former Smoker  . Packs/day: 1.50  . Years: 11.00  . Types: Cigarettes  . Start date: 08/14/1955  . Quit date: 12/15/1966  Smokeless Tobacco  . Never Used    Goals Met:  Independence with exercise equipment Exercise tolerated well No report of cardiac concerns or symptoms Strength training completed today  Goals Unmet:  Not Applicable  Comments: Check out 1030.   Dr. Kate Sable is Medical Director for [facility] Cardiac and Pulmonary Rehab.

## 2016-09-24 ENCOUNTER — Encounter (HOSPITAL_COMMUNITY)
Admission: RE | Admit: 2016-09-24 | Discharge: 2016-09-24 | Disposition: A | Discharge status: Home / Self Care | Payer: Medicare Other | Source: Ambulatory Visit | Attending: Cardiovascular Disease | Admitting: Cardiovascular Disease

## 2016-09-24 DIAGNOSIS — Z951 Presence of aortocoronary bypass graft: Secondary | ICD-10-CM

## 2016-09-24 DIAGNOSIS — Z48812 Encounter for surgical aftercare following surgery on the circulatory system: Secondary | ICD-10-CM | POA: Diagnosis not present

## 2016-09-24 DIAGNOSIS — Z9889 Other specified postprocedural states: Secondary | ICD-10-CM | POA: Diagnosis not present

## 2016-09-24 NOTE — Progress Notes (Signed)
Daily Session Note  Patient Details  Name: Corey Riley MRN: [id number] Date of Birth: 12-Nov-1935 Referring Provider:     [name] from 06/15/2016 in [place]  Referring Provider  Dr. Bronson Ing      Encounter Date: 09/24/2016  Check In:     Session Check In - 09/24/16 0930      Check-In   Location [facility]   Staff Present Aundra Dubin, RN, BSN;Gregory Luther Parody, BS, EP, Exercise Physiologist   Supervising physician immediately available to respond to emergencies See telemetry face sheet for immediately available MD   Medication changes reported     No   Fall or balance concerns reported    No   Warm-up and Cool-down Performed as group-led instruction   Resistance Training Performed Yes   VAD Patient? No     Pain Assessment   Currently in Pain? No/denies   Pain Score 0-No pain   Multiple Pain Sites No      Capillary Blood Glucose: No results found for this or any previous visit (from the past 24 hour(s)).    History  Smoking Status  . Former Smoker  . Packs/day: 1.50  . Years: 11.00  . Types: Cigarettes  . Start date: 08/14/1955  . Quit date: 12/15/1966  Smokeless Tobacco  . Never Used    Goals Met:  Independence with exercise equipment Exercise tolerated well No report of cardiac concerns or symptoms Strength training completed today  Goals Unmet:  Not Applicable  Comments: Check out 1030.   Dr. Kate Sable is Medical Director for [facility] Cardiac and Pulmonary Rehab.

## 2016-09-27 ENCOUNTER — Encounter (HOSPITAL_COMMUNITY)
Admission: RE | Admit: 2016-09-27 | Discharge: 2016-09-27 | Disposition: A | Discharge status: Home / Self Care | Payer: Medicare Other | Source: Ambulatory Visit | Attending: Cardiovascular Disease | Admitting: Cardiovascular Disease

## 2016-09-27 DIAGNOSIS — Z951 Presence of aortocoronary bypass graft: Secondary | ICD-10-CM

## 2016-09-27 DIAGNOSIS — Z48812 Encounter for surgical aftercare following surgery on the circulatory system: Secondary | ICD-10-CM | POA: Diagnosis not present

## 2016-09-27 DIAGNOSIS — Z9889 Other specified postprocedural states: Secondary | ICD-10-CM | POA: Diagnosis not present

## 2016-09-27 NOTE — Progress Notes (Signed)
Daily Session Note  Patient Details  Name: ORLANDA LEMMERMAN MRN: [id number] Date of Birth: Mar 27, 1935 Referring Provider:     [name] from 06/15/2016 in [place]  Referring Provider  Dr. Bronson Ing      Encounter Date: 09/27/2016  Check In:     Session Check In - 09/27/16 0947      Check-In   Location [facility]   Staff Present Aundra Dubin, RN, BSN;Marguerite Jarboe Luther Parody, BS, EP, Exercise Physiologist   Supervising physician immediately available to respond to emergencies See telemetry face sheet for immediately available MD   Medication changes reported     No   Fall or balance concerns reported    No   Warm-up and Cool-down Performed as group-led instruction   Resistance Training Performed Yes   VAD Patient? No     Pain Assessment   Currently in Pain? No/denies   Pain Score 0-No pain   Multiple Pain Sites No      Capillary Blood Glucose: No results found for this or any previous visit (from the past 24 hour(s)).    History  Smoking Status  . Former Smoker  . Packs/day: 1.50  . Years: 11.00  . Types: Cigarettes  . Start date: 08/14/1955  . Quit date: 12/15/1966  Smokeless Tobacco  . Never Used    Goals Met:  Independence with exercise equipment Exercise tolerated well No report of cardiac concerns or symptoms Strength training completed today  Goals Unmet:  Not Applicable  Comments: Check out 1030   Dr. Kate Sable is Medical Director for [place] and Pulmonary Rehab.

## 2016-09-29 ENCOUNTER — Encounter (HOSPITAL_COMMUNITY)
Admission: RE | Admit: 2016-09-29 | Discharge: 2016-09-29 | Disposition: A | Discharge status: Home / Self Care | Payer: Medicare Other | Source: Ambulatory Visit | Attending: Cardiovascular Disease | Admitting: Cardiovascular Disease

## 2016-09-29 DIAGNOSIS — Z9889 Other specified postprocedural states: Secondary | ICD-10-CM | POA: Diagnosis not present

## 2016-09-29 DIAGNOSIS — Z951 Presence of aortocoronary bypass graft: Secondary | ICD-10-CM | POA: Diagnosis not present

## 2016-09-29 DIAGNOSIS — Z48812 Encounter for surgical aftercare following surgery on the circulatory system: Secondary | ICD-10-CM | POA: Diagnosis not present

## 2016-09-29 NOTE — Progress Notes (Signed)
Daily Session Note  Patient Details  Name: Corey Riley MRN: [id number] Date of Birth: 1935-03-12 Referring Provider:     [name] from 06/15/2016 in [place]  Referring Provider  Dr. Bronson Ing      Encounter Date: 09/29/2016  Check In:     Session Check In - 09/29/16 0930      Check-In   Location [facility]   Staff Present Aundra Dubin, RN, BSN;Gregory Luther Parody, BS, EP, Exercise Physiologist   Supervising physician immediately available to respond to emergencies See telemetry face sheet for immediately available MD   Medication changes reported     No   Fall or balance concerns reported    No   Warm-up and Cool-down Performed as group-led instruction   Resistance Training Performed Yes   VAD Patient? No     Pain Assessment   Currently in Pain? No/denies   Pain Score 0-No pain   Multiple Pain Sites No      Capillary Blood Glucose: No results found for this or any previous visit (from the past 24 hour(s)).    History  Smoking Status  . Former Smoker  . Packs/day: 1.50  . Years: 11.00  . Types: Cigarettes  . Start date: 08/14/1955  . Quit date: 12/15/1966  Smokeless Tobacco  . Never Used    Goals Met:  Independence with exercise equipment Exercise tolerated well No report of cardiac concerns or symptoms Strength training completed today  Goals Unmet:  Not Applicable  Comments: Check out 1030.   Dr. Kate Sable is Medical Director for [facility] Cardiac and Pulmonary Rehab.

## 2016-10-01 ENCOUNTER — Encounter (HOSPITAL_COMMUNITY)
Admission: RE | Admit: 2016-10-01 | Discharge: 2016-10-01 | Disposition: A | Discharge status: Home / Self Care | Payer: Medicare Other | Source: Ambulatory Visit | Attending: Cardiovascular Disease | Admitting: Cardiovascular Disease

## 2016-10-01 VITALS — Ht 69.0 in | Wt 171.3 lb

## 2016-10-01 DIAGNOSIS — Z9889 Other specified postprocedural states: Secondary | ICD-10-CM | POA: Diagnosis not present

## 2016-10-01 DIAGNOSIS — Z951 Presence of aortocoronary bypass graft: Secondary | ICD-10-CM | POA: Diagnosis not present

## 2016-10-01 DIAGNOSIS — Z48812 Encounter for surgical aftercare following surgery on the circulatory system: Secondary | ICD-10-CM | POA: Diagnosis not present

## 2016-10-01 NOTE — Progress Notes (Signed)
Daily Session Note  Patient Details  Name: Corey Riley MRN: [id number] Date of Birth: [birth year] Referring Provider:     [name] from 06/15/2016 in [place]  Referring Provider  Dr. Bronson Ing      Encounter Date: 10/01/2016  Check In:     Session Check In - 10/01/16 0930      Check-In   Location [facility]   Staff Present Russella Dar, MS, EP, [facility], Exercise Physiologist;Gregory Luther Parody, BS, EP, Exercise Physiologist   Supervising physician immediately available to respond to emergencies See telemetry face sheet for immediately available MD   Medication changes reported     No   Fall or balance concerns reported    No   Tobacco Cessation No Change   Warm-up and Cool-down Performed as group-led instruction   Resistance Training Performed Yes   VAD Patient? No     Pain Assessment   Currently in Pain? No/denies   Pain Score 0-No pain   Multiple Pain Sites No      Capillary Blood Glucose: No results found for this or any previous visit (from the past 24 hour(s)).    History  Smoking Status  . Former Smoker  . Packs/day: 1.50  . Years: 11.00  . Types: Cigarettes  . Start date: 08/14/1955  . Quit date: 12/15/1966  Smokeless Tobacco  . Never Used    Goals Met:  Independence with exercise equipment Improved SOB with ADL's Exercise tolerated well No report of cardiac concerns or symptoms Strength training completed today  Goals Unmet:  Not Applicable  Comments: Check out 1030   Dr. Kate Sable is Medical Director for [place] and Pulmonary Rehab.

## 2016-10-04 ENCOUNTER — Encounter (HOSPITAL_COMMUNITY): Payer: Medicare Other

## 2016-10-08 NOTE — Progress Notes (Signed)
Discharge Progress Report  Patient Details  Name: Corey Riley MRN: [id number] Date of Birth: 04/08/1935 Referring Provider:     [name] from 06/15/2016 in [place]  Referring Provider  Dr. Bronson Ing       Number of Visits: 36  Reason for Discharge:  Patient reached a stable level of exercise. Patient independent in their exercise. Patient has met program and personal goals.  Smoking History:  History  Smoking Status  . Former Smoker  . Packs/day: 1.50  . Years: 11.00  . Types: Cigarettes  . Start date: 08/14/1955  . Quit date: 12/15/1966  Smokeless Tobacco  . Never Used    Diagnosis:  S/P CABG x 3  ADL UCSD:   Initial Exercise Prescription:     Initial Exercise Prescription - 06/15/16 1100      Date of Initial Exercise RX and Referring Provider   Date 06/15/16   Referring Provider Dr. Bronson Ing     Treadmill   MPH 2   Grade 0   Minutes 15   METs 2.5     NuStep   Level 2   SPM 17   Minutes 20   METs 1.9     Prescription Details   Frequency (times per week) 3   Duration Progress to 30 minutes of continuous aerobic without signs/symptoms of physical distress     Intensity   THRR 40-80% of Max Heartrate 90-107-125   Ratings of Perceived Exertion 11-13   Perceived Dyspnea 0-4     Progression   Progression Continue progressive overload as per policy without signs/symptoms or physical distress.     Resistance Training   Training Prescription Yes   Weight 1   Reps 10-15      Discharge Exercise Prescription (Final Exercise Prescription Changes):     Exercise Prescription Changes - 09/14/16 0800      Response to Exercise   Blood Pressure (Admit) 148/52   Blood Pressure (Exercise) 150/54   Blood Pressure (Exit) 140/50   Heart Rate (Admit) 50 bpm   Heart Rate (Exercise) 65 bpm   Heart Rate (Exit) 57 bpm   Rating of Perceived Exertion (Exercise) 10   Duration Progress to 30 minutes of   aerobic without signs/symptoms of physical distress   Intensity THRR unchanged     Progression   Progression Continue to progress workloads to maintain intensity without signs/symptoms of physical distress.     Resistance Training   Training Prescription Yes   Weight 3   Reps 10-15     Treadmill   MPH 2.4   Grade 0   Minutes 15   METs 2.8     NuStep   Level 4   SPM 95   Minutes 20   METs 2.5     Home Exercise Plan   Plans to continue exercise at Home (comment)   Frequency Add 2 additional days to program exercise sessions.      Functional Capacity:     6 Minute Walk    Row Name 06/15/16 1153 10/05/16 0802       6 Minute Walk   Phase Initial Discharge    Distance 1250 feet 1600 feet    Distance % Change 0 % 28 %    Distance Feet Change  - 350 ft    Walk Time 6 minutes 6 minutes    # of Rest Breaks 0 0    MPH 2.36 3.03    METS 2.81 3.32  RPE 11 13    Perceived Dyspnea  9 11    VO2 Peak 8.54 12.68    Symptoms No No    Resting HR 55 bpm 55 bpm    Resting BP 170/62 140/50    Resting Oxygen Saturation   - 92 %    Exercise Oxygen Saturation  during 6 min walk  - 90 %    Max Ex. HR 66 bpm 117 bpm    Max Ex. BP 168/60 166/60    2 Minute Post BP 166/64 150/58       Psychological, QOL, Others - Outcomes: PHQ 2/9: Depression screen [facility] 2/9 10/08/2016 06/15/2016  Decreased Interest 0 0  Down, Depressed, Hopeless 0 0  PHQ - 2 Score 0 0  Altered sleeping 0 0  Tired, decreased energy 1 1  Change in appetite 0 0  Feeling bad or failure about yourself  0 0  Trouble concentrating 0 0  Moving slowly or fidgety/restless 0 0  Suicidal thoughts 0 0  PHQ-9 Score 1 1  Difficult doing work/chores - Not difficult at all    Quality of Life:     Quality of Life - 10/05/16 0804      Quality of Life Scores   Health/Function Pre 22.57 %   Health/Function Post 25.3 %   Health/Function % Change 12.1 %   Socioeconomic Pre 24.92 %   Socioeconomic Post 27.3 %    Socioeconomic % Change  9.55 %   Psych/Spiritual Pre 25.93 %   Psych/Spiritual Post 25.07 %   Psych/Spiritual % Change -3.32 %   Family Pre 28.3 %   Family Post 25.2 %   Family % Change -10.95 %   GLOBAL Pre 24.58 %   GLOBAL Post 25.55 %   GLOBAL % Change 3.95 %      Personal Goals: Goals established at orientation with interventions provided to work toward goal.     Personal Goals and Risk Factors at Admission - 06/15/16 1257      Core Components/Risk Factors/Patient Goals on Admission    Weight Management Weight Maintenance   Improve shortness of breath with ADL's Yes   Intervention Provide education, individualized exercise plan and daily activity instruction to help decrease symptoms of SOB with activities of daily living.   Expected Outcomes Short Term: Achieves a reduction of symptoms when performing activities of daily living.   Personal Goal Other Yes   Personal Goal Get stonger and increase endurance.    Intervention Attend 3 x week in [place] and supplement with 2 x week exercise at home.    Expected Outcomes Achieve personal goals.        Personal Goals Discharge:     Goals and Risk Factor Review    Row Name 06/15/16 1259 07/21/16 1425 08/11/16 1258 09/06/16 1457 10/08/16 1358     Core Components/Risk Factors/Patient Goals Review   Personal Goals Review Diabetes;Improve shortness of breath with ADL's Weight Management/Obesity  Get stronger; increase endurance.  Weight Management/Obesity;Diabetes  Get stronger; increase endurance.  Weight Management/Obesity;Diabetes;Improve shortness of breath with ADL's  Get stronger; increase endurance.  Weight Management/Obesity;Diabetes  Get stronger; increase endurance.    Review  - Patient has completed 8 sessions gaining 6 lbs. He has been out of town for 2 weeks which has impeded his progression. He says he feels stronger. Will continue to monitor for progress.  Patient has completed 15 sessions gaining 10 lbs. His had no HGB  A1C on file. He is progressing  well in the program saying he feels better overall. He attributes his weight gain to overeating. He feels the program is helping him meet his goals.  Patient has completed 26 sessions gaining 11.6 lbs. He has doing well in the program and attributes his weight gain to eating too much.  He does not have an A1C on file. His reported fasting glucose readings are usualy WNL. He says the program is helping him meet his goals and he does feel stronger. Will continue to monitor.  Patient graduated with 36 sessions gaining 11 lbs. His exit measurements improved with increased grip strength; improved balance; and improved walk test by 28%. He did well in the program. He says the program helped him meet his goals. His A1C improved and he has better muscle tone and feels better physically. He also says he feels stronger and is doing more at home. He plans to continue exercising at the [facility].   Expected Outcomes  - Patient will continue to attend sessions and work toward meeting his goals.  Patient will complete the program meeting his personal goals.  Patient will complete the program and continues to meet his personal goals.  Patient will continue exercising and eating heart healthy and continue to maintain his goals.       Exercise Goals and Review:     Exercise Goals    Row Name 06/15/16 1257             Exercise Goals   Increase Physical Activity Yes       Intervention Provide advice, education, support and counseling about physical activity/exercise needs.;Develop an individualized exercise prescription for aerobic and resistive training based on initial evaluation findings, risk stratification, comorbidities and participant's personal goals.       Expected Outcomes Achievement of increased cardiorespiratory fitness and enhanced flexibility, muscular endurance and strength shown through measurements of functional capacity and personal statement of participant.        Increase Strength and Stamina Yes       Intervention Provide advice, education, support and counseling about physical activity/exercise needs.;Develop an individualized exercise prescription for aerobic and resistive training based on initial evaluation findings, risk stratification, comorbidities and participant's personal goals.       Expected Outcomes Achievement of increased cardiorespiratory fitness and enhanced flexibility, muscular endurance and strength shown through measurements of functional capacity and personal statement of participant.          Nutrition & Weight - Outcomes:     Pre Biometrics - 06/15/16 1155      Pre Biometrics   Height _0  (1.753 m)   Weight 166 lb 7.2 oz (75.5 kg)   Waist Circumference 34 inches   Hip Circumference 37 inches   Waist to Hip Ratio 0.92 %   BMI (Calculated) 24.6   Triceps Skinfold 13 mm   % Body Fat 23.2 %   Grip Strength 59.13 kg   Flexibility 0 in  Test was not available   Single Leg Stand 3 seconds         Post Biometrics - 10/05/16 0803       Post  Biometrics   Height _1  (1.753 m)   Weight 171 lb 4.8 oz (77.7 kg)   Waist Circumference 35 inches   Hip Circumference 38 inches   Waist to Hip Ratio 0.92 %   BMI (Calculated) 25.28   Triceps Skinfold 13 mm   % Body Fat 24 %   Grip Strength 66.6 kg  Flexibility 0 in   Single Leg Stand 6 seconds      Nutrition:   Nutrition Discharge:     Nutrition Assessments - 10/08/16 1356      MEDFICTS Scores   Pre Score 56   Post Score 24   Score Difference -32      Education Questionnaire Score:     Knowledge Questionnaire Score - 10/08/16 1355      Knowledge Questionnaire Score   Pre Score 19/24   Post Score 20/24      Goals reviewed with patient; copy given to patient.

## 2016-10-08 NOTE — Progress Notes (Signed)
Cardiac Individual Treatment Plan  Patient Details  Name: Corey Riley MRN: [id number] Date of Birth: Aug 29, 1935 Referring Provider:     [name] from 06/15/2016 in [place]  Referring Provider  Dr. Bronson Ing      Initial Encounter Date:    [name] from 06/15/2016 in [place]  Date  06/15/16  Referring Provider  Dr. Bronson Ing      Visit Diagnosis: S/P CABG x 3  Patient's Home Medications on Admission:  Current Outpatient Prescriptions:  .  amLODipine (NORVASC) 5 MG tablet, Take 1 tablet (5 mg total) by mouth daily. (Patient taking differently: Take 10 mg by mouth daily. ), Disp: 30 tablet, Rfl: 6 .  aspirin EC 81 MG tablet, Take 162 mg by mouth daily., Disp: , Rfl:  .  atorvastatin (LIPITOR) 20 MG tablet, Take 20 mg by mouth every Monday, Wednesday, and Friday., Disp: , Rfl:  .  dapsone 25 MG tablet, Take 2 tablets by mouth daily., Disp: , Rfl:  .  docusate sodium (COLACE) 100 MG capsule, Take 100 mg by mouth 2 (two) times daily as needed., Disp: , Rfl:  .  insulin glargine (LANTUS SOLOSTAR) 100 UNIT/ML injection, Inject 18 Units into the skin daily. , Disp: , Rfl:  .  levothyroxine (SYNTHROID, LEVOTHROID) 125 MCG tablet, Take 125 mcg by mouth daily before breakfast., Disp: , Rfl:  .  metoprolol (LOPRESSOR) 50 MG tablet, Take 1 tablet by mouth 2 (two) times daily. Has 100 mg tab currently that he cuts in half, Disp: , Rfl:  .  mycophenolate (MYFORTIC) 180 MG EC tablet, Take 180 mg by mouth 2 (two) times daily. , Disp: , Rfl:  .  nitroGLYCERIN (NITROSTAT) 0.4 MG SL tablet, Place 1 tablet (0.4 mg total) under the tongue every 5 (five) minutes x 3 doses as needed for chest pain. If no relief after 3 rd dose, proceed to the ED for an evaluation, Disp: 25 tablet, Rfl: 3 .  NOVOLOG FLEXPEN 100 UNIT/ML FlexPen, Inject 10 Units into the skin 3 (three) times daily. Plus sliding scale,  Disp: , Rfl: 1 .  omeprazole (PRILOSEC) 20 MG capsule, Take 1 capsule by mouth daily., Disp: , Rfl:  .  oxyCODONE (OXY IR/ROXICODONE) 5 MG immediate release tablet, Take 5 mg by mouth every 4 (four) hours as needed. for pain, Disp: , Rfl: 0 .  polyethylene glycol powder (GLYCOLAX/MIRALAX) powder, Take 17 g by mouth daily as needed., Disp: , Rfl:  .  predniSONE (DELTASONE) 5 MG tablet, Take 5 mg by mouth daily., Disp: , Rfl:  .  simvastatin (ZOCOR) 40 MG tablet, Take 1 tablet by mouth daily., Disp: , Rfl:  .  Tacrolimus 1 MG TB24, Take 2 mg by mouth daily. , Disp: , Rfl:  .  tamsulosin (FLOMAX) 0.4 MG CAPS capsule, Take 1 capsule by mouth at bedtime., Disp: , Rfl: 5 No current facility-administered medications for this encounter.   Facility-Administered Medications Ordered in Other Encounters:  .  cyclopentolate-phenylephrine (CYCLOMYDRYL) 0.2-1 % ophthalmic solution 1 drop, 1 drop, Right Eye, Once, Williams Che, MD .  ketorolac (ACULAR) 0.5 % ophthalmic solution 1 drop, 1 drop, Right Eye, Once, Williams Che, MD  Past Medical History: Past Medical History:  Diagnosis Date  . Anemia   . Anginal pain ([place])    "a little."   . Aortic stenosis    Mild  . Chronic kidney disease    pt  states 30% kidney function started dialysis 04/23/14 T/Th/Sa  . Constipation   . Coronary atherosclerosis of native coronary artery    DES LAD 8/03  . Essential hypertension, benign   . Heart murmur    "faint"  . Hypothyroidism   . Mixed hyperlipidemia   . Pneumonia   . Shortness of breath    with exertion  . Type 2 diabetes mellitus ([facility])     Tobacco Use: History  Smoking Status  . Former Smoker  . Packs/day: 1.50  . Years: 11.00  . Types: Cigarettes  . Start date: 08/14/1955  . Quit date: 12/15/1966  Smokeless Tobacco  . Never Used    Labs: Recent Review Flowsheet Data    There is no flowsheet data to display.      Capillary Blood Glucose: Lab Results  Component Value Date    GLUCAP 279 (H) 08/16/2016   GLUCAP 128 (H) 05/08/2014   GLUCAP 151 (H) 04/22/2014   GLUCAP 136 (H) 04/22/2014   GLUCAP 146 (H) 04/22/2014     Exercise Target Goals:    Exercise Program Goal: Individual exercise prescription set with THRR, safety & activity barriers. Participant demonstrates ability to understand and report RPE using BORG scale, to self-measure pulse accurately, and to acknowledge the importance of the exercise prescription.  Exercise Prescription Goal: Starting with aerobic activity 30 plus minutes a day, 3 days per week for initial exercise prescription. Provide home exercise prescription and guidelines that participant acknowledges understanding prior to discharge.  Activity Barriers & Risk Stratification:   6 Minute Walk:     6 Minute Walk    Row Name 06/15/16 1153 10/05/16 0802       6 Minute Walk   Phase Initial Discharge    Distance 1250 feet 1600 feet    Distance % Change 0 % 28 %    Distance Feet Change  - 350 ft    Walk Time 6 minutes 6 minutes    # of Rest Breaks 0 0    MPH 2.36 3.03    METS 2.81 3.32    RPE 11 13    Perceived Dyspnea  9 11    VO2 Peak 8.54 12.68    Symptoms No No    Resting HR 55 bpm 55 bpm    Resting BP 170/62 140/50    Resting Oxygen Saturation   - 92 %    Exercise Oxygen Saturation  during 6 min walk  - 90 %    Max Ex. HR 66 bpm 117 bpm    Max Ex. BP 168/60 166/60    2 Minute Post BP 166/64 150/58       Oxygen Initial Assessment:   Oxygen Re-Evaluation:   Oxygen Discharge (Final Oxygen Re-Evaluation):   Initial Exercise Prescription:     Initial Exercise Prescription - 06/15/16 1100      Date of Initial Exercise RX and Referring Provider   Date 06/15/16   Referring Provider Dr. Bronson Ing     Treadmill   MPH 2   Grade 0   Minutes 15   METs 2.5     NuStep   Level 2   SPM 17   Minutes 20   METs 1.9     Prescription Details   Frequency (times per week) 3   Duration Progress to 30 minutes of  continuous aerobic without signs/symptoms of physical distress     Intensity   THRR 40-80% of Max Heartrate 90-107-125   Ratings of Perceived Exertion  11-13   Perceived Dyspnea 0-4     Progression   Progression Continue progressive overload as per policy without signs/symptoms or physical distress.     Resistance Training   Training Prescription Yes   Weight 1   Reps 10-15      Perform Capillary Blood Glucose checks as needed.  Exercise Prescription Changes:      Exercise Prescription Changes    Row Name 06/21/16 1400 07/05/16 1400 07/15/16 1500 08/02/16 1500 08/25/16 1400     Response to Exercise   Blood Pressure (Admit) 146/58 160/50 160/50 162/62 158/62   Blood Pressure (Exercise) 150/50 150/50 166/60 170/82 168/68   Blood Pressure (Exit) 122/62 130/50 146/50 136/70 134/62   Heart Rate (Admit) 53 bpm 56 bpm 48 bpm 51 bpm 53 bpm   Heart Rate (Exercise) 70 bpm 67 bpm 69 bpm 64 bpm 75 bpm   Heart Rate (Exit) 62 bpm 61 bpm 60 bpm 56 bpm 67 bpm   Rating of Perceived Exertion (Exercise) _0 Duration Progress to 30 minutes of  aerobic without signs/symptoms of physical distress Progress to 30 minutes of  aerobic without signs/symptoms of physical distress Progress to 30 minutes of  aerobic without signs/symptoms of physical distress Progress to 30 minutes of  aerobic without signs/symptoms of physical distress Progress to 30 minutes of  aerobic without signs/symptoms of physical distress   Intensity _1      Progression   Progression Continue to progress workloads to maintain intensity without signs/symptoms of physical distress. Continue to progress workloads to maintain intensity without signs/symptoms of physical distress. Continue to progress workloads to maintain intensity without signs/symptoms of physical distress. Continue to progress workloads to maintain intensity without signs/symptoms of  physical distress. Continue to progress workloads to maintain intensity without signs/symptoms of physical distress.     Resistance Training   Training Prescription _2    Weight _3 Reps 10-15 10-15 10-15 10-15 10-15     Treadmill   MPH 2 2 2.1 2.3 2.4   Grade 0 0 0 0 0   Minutes _4 METs 2.5 2.5 2.6 2.7 2.8     NuStep   Level _5 SPM _6 32   Minutes _7 METs 1.9 3.51 3.5 2.2 2.4     Home Exercise Plan   Plans to continue exercise at Home (comment) Home (comment) Home (comment) Home (comment) Home (comment)   Frequency Add 2 additional days to program exercise sessions. Add 2 additional days to program exercise sessions. Add 2 additional days to program exercise sessions. Add 2 additional days to program exercise sessions. Add 2 additional days to program exercise sessions.   [place] Name 09/14/16 0800             Response to Exercise   Blood Pressure (Admit) 148/52       Blood Pressure (Exercise) 150/54       Blood Pressure (Exit) 140/50       Heart Rate (Admit) 50 bpm       Heart Rate (Exercise) 65 bpm       Heart Rate (Exit) 57 bpm       Rating of Perceived Exertion (Exercise) 10       Duration Progress to 30 minutes of  aerobic  without signs/symptoms of physical distress       Intensity THRR unchanged         Progression   Progression Continue to progress workloads to maintain intensity without signs/symptoms of physical distress.         Resistance Training   Training Prescription Yes       Weight 3       Reps 10-15         Treadmill   MPH 2.4       Grade 0       Minutes 15       METs 2.8         NuStep   Level 4       SPM 95       Minutes 20       METs 2.5         Home Exercise Plan   Plans to continue exercise at Home (comment)       Frequency Add 2 additional days to program exercise sessions.          Exercise Comments:      Exercise Comments    Row Name 06/21/16 1454 07/05/16  1432 07/15/16 1530 08/02/16 1501 08/25/16 1430   Exercise Comments Patient has just started CR and will be progressed in time.  Patient is doing well in CR. He has not increased in level but has increased in watts.  Patient is doing well in CR.  Patient is doing well in CR.  Patient is doing well in CR.    Row Name 09/14/16 [phone]           Exercise Comments Patient is doing well in CR. He does not want to go any fast er on the treadmill and he is maintaining his level on the Nustep and increasing his SPm and Mets.           Exercise Goals and Review:      Exercise Goals    Row Name 06/15/16 1257             Exercise Goals   Increase Physical Activity Yes       Intervention Provide advice, education, support and counseling about physical activity/exercise needs.;Develop an individualized exercise prescription for aerobic and resistive training based on initial evaluation findings, risk stratification, comorbidities and participant's personal goals.       Expected Outcomes Achievement of increased cardiorespiratory fitness and enhanced flexibility, muscular endurance and strength shown through measurements of functional capacity and personal statement of participant.       Increase Strength and Stamina Yes       Intervention Provide advice, education, support and counseling about physical activity/exercise needs.;Develop an individualized exercise prescription for aerobic and resistive training based on initial evaluation findings, risk stratification, comorbidities and participant's personal goals.       Expected Outcomes Achievement of increased cardiorespiratory fitness and enhanced flexibility, muscular endurance and strength shown through measurements of functional capacity and personal statement of participant.          Exercise Goals Re-Evaluation :     Exercise Goals Re-Evaluation    Row Name 07/21/16 1427 08/11/16 1302 09/06/16 1500 10/08/16 1402       Exercise Goal  Re-Evaluation   Exercise Goals Review Increase Physical Activity;Increase Strenth and Stamina Increase Physical Activity;Increase Strenth and Stamina Increase Physical Activity;Increase Strenth and Stamina Increase Physical Activity;Increase Strength and Stamina    Comments Patient has completed 8 sessions with some progression. He  says he feels stronger since starting the program. Will continue to monitor for progress.  Patient has completed 15 sessions with increased strength and stamina. He says his legs and arms feel stronger and he feels better overall with more energy.  Patient has completed 22 sessions. He continues to progress well in the program with increased treadmill speeds. He says he feels stronger and has more endurance. Will continue to monitor.  Patient graduated meeting his exercise goals.    Expected Outcomes Patient will complete the program with increased strength, stamina, and activity. Patient will complete the program with continued increased strength, stamina, and activity. Patient will complete the program with continued increased strength, stamina, and activity.  Patient will continue to exercise meeting his exercise goals.         Discharge Exercise Prescription (Final Exercise Prescription Changes):     Exercise Prescription Changes - 09/14/16 0800      Response to Exercise   Blood Pressure (Admit) 148/52   Blood Pressure (Exercise) 150/54   Blood Pressure (Exit) 140/50   Heart Rate (Admit) 50 bpm   Heart Rate (Exercise) 65 bpm   Heart Rate (Exit) 57 bpm   Rating of Perceived Exertion (Exercise) 10   Duration Progress to 30 minutes of  aerobic without signs/symptoms of physical distress   Intensity THRR unchanged     Progression   Progression Continue to progress workloads to maintain intensity without signs/symptoms of physical distress.     Resistance Training   Training Prescription Yes   Weight 3   Reps 10-15     Treadmill   MPH 2.4   Grade 0    Minutes 15   METs 2.8     NuStep   Level 4   SPM 95   Minutes 20   METs 2.5     Home Exercise Plan   Plans to continue exercise at Home (comment)   Frequency Add 2 additional days to program exercise sessions.      Nutrition:  Target Goals: Understanding of nutrition guidelines, daily intake of sodium <1536m, cholesterol <2041m calories 30% from fat and 7% or less from saturated fats, daily to have 5 or more servings of fruits and vegetables.  Biometrics:     Pre Biometrics - 06/15/16 1155      Pre Biometrics   Height _0  (1.753 m)   Weight 166 lb 7.2 oz (75.5 kg)   Waist Circumference 34 inches   Hip Circumference 37 inches   Waist to Hip Ratio 0.92 %   BMI (Calculated) 24.6   Triceps Skinfold 13 mm   % Body Fat 23.2 %   Grip Strength 59.13 kg   Flexibility 0 in  Test was not available   Single Leg Stand 3 seconds         Post Biometrics - 10/05/16 0803       Post  Biometrics   Height _1  (1.753 m)   Weight 171 lb 4.8 oz (77.7 kg)   Waist Circumference 35 inches   Hip Circumference 38 inches   Waist to Hip Ratio 0.92 %   BMI (Calculated) 25.28   Triceps Skinfold 13 mm   % Body Fat 24 %   Grip Strength 66.6 kg   Flexibility 0 in   Single Leg Stand 6 seconds      Nutrition Therapy Plan and Nutrition Goals:   Nutrition Discharge: Rate Your Plate Scores:     Nutrition Assessments - 10/08/16 1356  MEDFICTS Scores   Pre Score 56   Post Score 24   Score Difference -32      Nutrition Goals Re-Evaluation:     Nutrition Goals Re-Evaluation    Row Name 10/08/16 1356             Goals   Current Weight 171 lb (77.6 kg)       Nutrition Goal For heart healthy choices add >50% of whole grains, make half their plate fruits and vegetables. Discuss the difference between starchy vegetables and leafy greens, and how leafy vegetables provide fiber, helps maintain healthy weight, helps control blood glucose, and lowers cholesterol.  Discuss  purchasing fresh or frozen vegetable to reduce sodium and not to add grease, fat or sugar. Consume <18oz of red meat per week. Consume lean cuts of meats and very little of meats high in sodium and nitrates such as pork and lunch meats. Discussed portion control for all food groups.       Comment Patient says he is eating healthier. His exit Medficts score improved by 32%. He says he feels he is meeting his nutrition goals.        Expected Outcome Patient will continue to meet his nutrition goals.           Nutrition Goals Discharge (Final Nutrition Goals Re-Evaluation):     Nutrition Goals Re-Evaluation - 10/08/16 1356      Goals   Current Weight 171 lb (77.6 kg)   Nutrition Goal For heart healthy choices add >50% of whole grains, make half their plate fruits and vegetables. Discuss the difference between starchy vegetables and leafy greens, and how leafy vegetables provide fiber, helps maintain healthy weight, helps control blood glucose, and lowers cholesterol.  Discuss purchasing fresh or frozen vegetable to reduce sodium and not to add grease, fat or sugar. Consume <18oz of red meat per week. Consume lean cuts of meats and very little of meats high in sodium and nitrates such as pork and lunch meats. Discussed portion control for all food groups.   Comment Patient says he is eating healthier. His exit Medficts score improved by 32%. He says he feels he is meeting his nutrition goals.    Expected Outcome Patient will continue to meet his nutrition goals.       Psychosocial: Target Goals: Acknowledge presence or absence of significant depression and/or stress, maximize coping skills, provide positive support system. Participant is able to verbalize types and ability to use techniques and skills needed for reducing stress and depression.  Initial Review & Psychosocial Screening:     Initial Psych Review & Screening - 06/15/16 1300      Initial Review   Current issues with None  Identified     Family Dynamics   Good Support System? Yes     Barriers   Psychosocial barriers to participate in program There are no identifiable barriers or psychosocial needs.     Screening Interventions   Interventions Encouraged to exercise      Quality of Life Scores:     Quality of Life - 10/05/16 0804      Quality of Life Scores   Health/Function Pre 22.57 %   Health/Function Post 25.3 %   Health/Function % Change 12.1 %   Socioeconomic Pre 24.92 %   Socioeconomic Post 27.3 %   Socioeconomic % Change  9.55 %   Psych/Spiritual Pre 25.93 %   Psych/Spiritual Post 25.07 %   Psych/Spiritual % Change -3.32 %  Family Pre 28.3 %   Family Post 25.2 %   Family % Change -10.95 %   GLOBAL Pre 24.58 %   GLOBAL Post 25.55 %   GLOBAL % Change 3.95 %      PHQ-9: Recent Review Flowsheet Data    Depression screen [facility] 2/9 10/08/2016 06/15/2016   Decreased Interest 0 0   Down, Depressed, Hopeless 0 0   PHQ - 2 Score 0 0   Altered sleeping 0 0   Tired, decreased energy 1 1   Change in appetite 0 0   Feeling bad or failure about yourself  0 0   Trouble concentrating 0 0   Moving slowly or fidgety/restless 0 0   Suicidal thoughts 0 0   PHQ-9 Score 1 1   Difficult doing work/chores - Not difficult at all     Interpretation of Total Score  Total Score Depression Severity:  1-4 = Minimal depression, 5-9 = Mild depression, 10-14 = Moderate depression, 15-19 = Moderately severe depression, 20-27 = Severe depression   Psychosocial Evaluation and Intervention:     Psychosocial Evaluation - 10/08/16 1403      Discharge Psychosocial Assessment & Intervention   Comments Patient has no psychosocial issues identified at discharge. His PHQ9 score remained the same at 1 and his QOL exit score improved by 3.95% at 25.55.       Psychosocial Re-Evaluation:     Psychosocial Re-Evaluation    [place] Name 07/21/16 1429 08/11/16 1303 09/06/16 1502         Psychosocial Re-Evaluation    Current issues with None Identified None Identified None Identified     Expected Outcomes Patient will have no psychosocial issues identified at discharge.  Patient will have no psychosocial barriers identified at discharge.  Patient will have no psychosocial barriers identified at discharge.      Interventions Encouraged to attend Cardiac Rehabilitation for the exercise Encouraged to attend Cardiac Rehabilitation for the exercise Encouraged to attend Cardiac Rehabilitation for the exercise;Stress management education;Relaxation education     Continue Psychosocial Services  No Follow up required No Follow up required No Follow up required        Psychosocial Discharge (Final Psychosocial Re-Evaluation):     Psychosocial Re-Evaluation - 09/06/16 1502      Psychosocial Re-Evaluation   Current issues with None Identified   Expected Outcomes Patient will have no psychosocial barriers identified at discharge.    Interventions Encouraged to attend Cardiac Rehabilitation for the exercise;Stress management education;Relaxation education   Continue Psychosocial Services  No Follow up required      Vocational Rehabilitation: Provide vocational rehab assistance to qualifying candidates.   Vocational Rehab Evaluation & Intervention:     Vocational Rehab - 06/15/16 1254      Initial Vocational Rehab Evaluation & Intervention   Assessment shows need for Vocational Rehabilitation No      Education: Education Goals: Education classes will be provided on a weekly basis, covering required topics. Participant will state understanding/return demonstration of topics presented.  Learning Barriers/Preferences:     Learning Barriers/Preferences - 06/15/16 1251      Learning Barriers/Preferences   Learning Barriers None   Learning Preferences Computer/Internet;Written Material;Pictoral      Education Topics: Hypertension, Hypertension Reduction -Define heart disease and high blood  pressure. Discus how high blood pressure affects the body and ways to reduce high blood pressure.   CARDIAC REHAB PHASE II EXERCISE from 09/29/2016 in [place]  Date  (P) 09/01/16  Educator  (P) DC      Exercise and Your Heart -Discuss why it is important to exercise, the FITT principles of exercise, normal and abnormal responses to exercise, and how to exercise safely.   Angina -Discuss definition of angina, causes of angina, treatment of angina, and how to decrease risk of having angina.   CARDIAC REHAB PHASE II EXERCISE from 09/29/2016 in [place]  Date  (P) 09/15/16  Educator  (P) DC      Cardiac Medications -Review what the following cardiac medications are used for, how they affect the body, and side effects that may occur when taking the medications.  Medications include Aspirin, Beta blockers, calcium channel blockers, ACE Inhibitors, angiotensin receptor blockers, diuretics, digoxin, and antihyperlipidemics.   CARDIAC REHAB PHASE II EXERCISE from 09/29/2016 in [place]  Date  (P) 06/23/16  Educator  (P) DJ  Instruction Review Code  (P) 2- meets goals/outcomes      Congestive Heart Failure -Discuss the definition of CHF, how to live with CHF, the signs and symptoms of CHF, and how keep track of weight and sodium intake.   CARDIAC REHAB PHASE II EXERCISE from 09/29/2016 in [place]  Date  (P) 06/30/16  Educator  (P) DC  Instruction Review Code  (P) 2- meets goals/outcomes      Heart Disease and Intimacy -Discus the effect sexual activity has on the heart, how changes occur during intimacy as we age, and safety during sexual activity.   Smoking Cessation / COPD -Discuss different methods to quit smoking, the health benefits of quitting smoking, and the definition of COPD.   CARDIAC REHAB PHASE II EXERCISE from 09/29/2016 in [place]  Date  (P)  07/14/16  Educator  (P) Diane Coad      Nutrition I: Fats -Discuss the types of cholesterol, what cholesterol does to the heart, and how cholesterol levels can be controlled.   Nutrition II: Labels -Discuss the different components of food labels and how to read food label   CARDIAC REHAB PHASE II EXERCISE from 09/29/2016 in [place]  Date  (P) 07/28/16  Educator  (P) DC      Heart Parts and Heart Disease -Discuss the anatomy of the heart, the pathway of blood circulation through the heart, and these are affected by heart disease.   CARDIAC REHAB PHASE II EXERCISE from 09/29/2016 in [place]  Date  (P) 08/06/16  Educator  (P) DC      Stress I: Signs and Symptoms -Discuss the causes of stress, how stress may lead to anxiety and depression, and ways to limit stress.   CARDIAC REHAB PHASE II EXERCISE from 09/29/2016 in [place]  Date  (P) 08/11/16  Educator  (P) GC      Stress II: Relaxation -Discuss different types of relaxation techniques to limit stress.   CARDIAC REHAB PHASE II EXERCISE from 09/29/2016 in [place]  Date  (P) 08/18/16  Educator  (P) GC      Warning Signs of Stroke / TIA -Discuss definition of a stroke, what the signs and symptoms are of a stroke, and how to identify when someone is having stroke.   CARDIAC REHAB PHASE II EXERCISE from 09/29/2016 in [place]  Date  (P) 08/25/16  Educator  (P) DC      Knowledge Questionnaire Score:     Knowledge Questionnaire Score - 10/08/16  1355      Knowledge Questionnaire Score   Pre Score 19/24   Post Score 20/24      Core Components/Risk Factors/Patient Goals at Admission:     Personal Goals and Risk Factors at Admission - 06/15/16 1257      Core Components/Risk Factors/Patient Goals on Admission    Weight Management Weight Maintenance   Improve shortness of breath with ADL's  Yes   Intervention Provide education, individualized exercise plan and daily activity instruction to help decrease symptoms of SOB with activities of daily living.   Expected Outcomes Short Term: Achieves a reduction of symptoms when performing activities of daily living.   Personal Goal Other Yes   Personal Goal Get stonger and increase endurance.    Intervention Attend 3 x week in CR and supplement with 2 x week exercise at home.    Expected Outcomes Achieve personal goals.       Core Components/Risk Factors/Patient Goals Review:      Goals and Risk Factor Review    Row Name 06/15/16 1259 07/21/16 1425 08/11/16 1258 09/06/16 1457 10/08/16 1358     Core Components/Risk Factors/Patient Goals Review   Personal Goals Review Diabetes;Improve shortness of breath with ADL's Weight Management/Obesity  Get stronger; increase endurance.  Weight Management/Obesity;Diabetes  Get stronger; increase endurance.  Weight Management/Obesity;Diabetes;Improve shortness of breath with ADL's  Get stronger; increase endurance.  Weight Management/Obesity;Diabetes  Get stronger; increase endurance.    Review  - Patient has completed 8 sessions gaining 6 lbs. He has been out of town for 2 weeks which has impeded his progression. He says he feels stronger. Will continue to monitor for progress.  Patient has completed 15 sessions gaining 10 lbs. His had no HGB A1C on file. He is progressing well in the program saying he feels better overall. He attributes his weight gain to overeating. He feels the program is helping him meet his goals.  Patient has completed 26 sessions gaining 11.6 lbs. He has doing well in the program and attributes his weight gain to eating too much.  He does not have an A1C on file. His reported fasting glucose readings are usualy WNL. He says the program is helping him meet his goals and he does feel stronger. Will continue to monitor.  Patient graduated with 36 sessions gaining 11 lbs. His exit  measurements improved with increased grip strength; improved balance; and improved walk test by 28%. He did well in the program. He says the program helped him meet his goals. His A1C improved and he has better muscle tone and feels better physically. He also says he feels stronger and is doing more at home. He plans to continue exercising at the [facility].   Expected Outcomes  - Patient will continue to attend sessions and work toward meeting his goals.  Patient will complete the program meeting his personal goals.  Patient will complete the program and continues to meet his personal goals.  Patient will continue exercising and eating heart healthy and continue to maintain his goals.       Core Components/Risk Factors/Patient Goals at Discharge (Final Review):      Goals and Risk Factor Review - 10/08/16 1358      Core Components/Risk Factors/Patient Goals Review   Personal Goals Review Weight Management/Obesity;Diabetes  Get stronger; increase endurance.    Review Patient graduated with 36 sessions gaining 11 lbs. His exit measurements improved with increased grip strength; improved balance; and improved walk test by 28%. He did  well in the program. He says the program helped him meet his goals. His A1C improved and he has better muscle tone and feels better physically. He also says he feels stronger and is doing more at home. He plans to continue exercising at the [facility].   Expected Outcomes Patient will continue exercising and eating heart healthy and continue to maintain his goals.       ITP Comments:     ITP Comments    Row Name 06/15/16 1255 06/23/16 0751 08/05/16 1550       ITP Comments Corey Riley is a pleasant 73 yr. old male. He has had a recent (R) kidney transplant and prostate removed. He is doing well.  Patient new to program. He has compeleted 2 sessions. Will continue to monitor for progress.  Patient met with Registered Dietitian to discuss nutrition topics including: Heart healthty  eating, heart healthy cooking and making smart choices when shopping; Portion control; weight management; and hydration.        Comments: Patient graduated from [place] today on 10/01/16 after completing 36 sessions. He achieved LTG of 30 minutes of aerobic exercise at Max Met level of 2.8. All patients vitals are WNL. Patient has met with dietician. Discharge instruction has been reviewed in detail and patient stated an understanding of material given. Patient plans to continue exercising at the [facility]. Cardiac Rehab staff will make f/u calls at 1 month, 6 months, and 1 year. Patient had no complaints of any abnormal S/S or pain on their exit visit.

## 2016-10-18 DIAGNOSIS — D631 Anemia in chronic kidney disease: Secondary | ICD-10-CM | POA: Diagnosis not present

## 2016-10-18 DIAGNOSIS — Z4822 Encounter for aftercare following kidney transplant: Secondary | ICD-10-CM | POA: Diagnosis not present

## 2016-10-18 DIAGNOSIS — Z23 Encounter for immunization: Secondary | ICD-10-CM | POA: Diagnosis not present

## 2016-10-18 DIAGNOSIS — I120 Hypertensive chronic kidney disease with stage 5 chronic kidney disease or end stage renal disease: Secondary | ICD-10-CM | POA: Diagnosis not present

## 2016-10-18 DIAGNOSIS — Z940 Kidney transplant status: Secondary | ICD-10-CM | POA: Diagnosis not present

## 2016-10-18 DIAGNOSIS — I2510 Atherosclerotic heart disease of native coronary artery without angina pectoris: Secondary | ICD-10-CM | POA: Diagnosis not present

## 2016-10-18 DIAGNOSIS — Z9861 Coronary angioplasty status: Secondary | ICD-10-CM | POA: Diagnosis not present

## 2016-10-18 DIAGNOSIS — E1122 Type 2 diabetes mellitus with diabetic chronic kidney disease: Secondary | ICD-10-CM | POA: Diagnosis not present

## 2016-10-18 DIAGNOSIS — N179 Acute kidney failure, unspecified: Secondary | ICD-10-CM | POA: Diagnosis not present

## 2016-10-18 DIAGNOSIS — Z794 Long term (current) use of insulin: Secondary | ICD-10-CM | POA: Diagnosis not present

## 2016-10-18 DIAGNOSIS — E785 Hyperlipidemia, unspecified: Secondary | ICD-10-CM | POA: Diagnosis not present

## 2016-10-18 DIAGNOSIS — D8989 Other specified disorders involving the immune mechanism, not elsewhere classified: Secondary | ICD-10-CM | POA: Diagnosis not present

## 2016-10-18 DIAGNOSIS — E8770 Fluid overload, unspecified: Secondary | ICD-10-CM | POA: Diagnosis not present

## 2016-10-18 DIAGNOSIS — E119 Type 2 diabetes mellitus without complications: Secondary | ICD-10-CM | POA: Diagnosis not present

## 2016-10-18 DIAGNOSIS — N186 End stage renal disease: Secondary | ICD-10-CM | POA: Diagnosis not present

## 2016-10-18 DIAGNOSIS — Z951 Presence of aortocoronary bypass graft: Secondary | ICD-10-CM | POA: Diagnosis not present

## 2016-10-18 DIAGNOSIS — Z7982 Long term (current) use of aspirin: Secondary | ICD-10-CM | POA: Diagnosis not present

## 2016-10-18 DIAGNOSIS — D649 Anemia, unspecified: Secondary | ICD-10-CM | POA: Diagnosis not present

## 2016-10-18 DIAGNOSIS — N401 Enlarged prostate with lower urinary tract symptoms: Secondary | ICD-10-CM | POA: Diagnosis not present

## 2016-10-18 DIAGNOSIS — E039 Hypothyroidism, unspecified: Secondary | ICD-10-CM | POA: Diagnosis not present

## 2016-10-18 DIAGNOSIS — I10 Essential (primary) hypertension: Secondary | ICD-10-CM | POA: Diagnosis not present

## 2016-10-18 DIAGNOSIS — D899 Disorder involving the immune mechanism, unspecified: Secondary | ICD-10-CM | POA: Diagnosis not present

## 2016-10-18 DIAGNOSIS — R609 Edema, unspecified: Secondary | ICD-10-CM | POA: Diagnosis not present

## 2016-10-18 DIAGNOSIS — Z87891 Personal history of nicotine dependence: Secondary | ICD-10-CM | POA: Diagnosis not present

## 2016-10-18 DIAGNOSIS — Z955 Presence of coronary angioplasty implant and graft: Secondary | ICD-10-CM | POA: Diagnosis not present

## 2016-10-18 DIAGNOSIS — Z79899 Other long term (current) drug therapy: Secondary | ICD-10-CM | POA: Diagnosis not present

## 2016-10-18 DIAGNOSIS — Z992 Dependence on renal dialysis: Secondary | ICD-10-CM | POA: Diagnosis not present

## 2016-11-01 ENCOUNTER — Other Ambulatory Visit: Payer: Self-pay | Admitting: Cardiovascular Disease

## 2016-11-01 MED ORDER — NITROGLYCERIN 0.4 MG SL SUBL: 0.4000 mg | SUBLINGUAL_TABLET | SUBLINGUAL | 1 refills | Status: DC | PRN

## 2016-11-01 NOTE — Telephone Encounter (Signed)
Patient WALK IN  nitroGLYCERIN (NITROSTAT) 0.4 MG SL tablet   [facility]  Leaving to go out of the country 11/03/16

## 2016-11-01 NOTE — Telephone Encounter (Signed)
Medication sent to pharmacy. Pt needs an appt with Dr Bronson Ing

## 2016-12-01 DIAGNOSIS — Z6825 Body mass index (BMI) 25.0-25.9, adult: Secondary | ICD-10-CM | POA: Diagnosis not present

## 2016-12-01 DIAGNOSIS — J0101 Acute recurrent maxillary sinusitis: Secondary | ICD-10-CM | POA: Diagnosis not present

## 2016-12-08 DIAGNOSIS — N186 End stage renal disease: Secondary | ICD-10-CM | POA: Diagnosis not present

## 2016-12-08 DIAGNOSIS — D649 Anemia, unspecified: Secondary | ICD-10-CM | POA: Diagnosis not present

## 2016-12-08 DIAGNOSIS — Z7952 Long term (current) use of systemic steroids: Secondary | ICD-10-CM | POA: Diagnosis not present

## 2016-12-08 DIAGNOSIS — Z4822 Encounter for aftercare following kidney transplant: Secondary | ICD-10-CM | POA: Diagnosis not present

## 2016-12-08 DIAGNOSIS — I10 Essential (primary) hypertension: Secondary | ICD-10-CM | POA: Diagnosis not present

## 2016-12-08 DIAGNOSIS — E785 Hyperlipidemia, unspecified: Secondary | ICD-10-CM | POA: Diagnosis not present

## 2016-12-08 DIAGNOSIS — D899 Disorder involving the immune mechanism, unspecified: Secondary | ICD-10-CM | POA: Diagnosis not present

## 2016-12-08 DIAGNOSIS — Z794 Long term (current) use of insulin: Secondary | ICD-10-CM | POA: Diagnosis not present

## 2016-12-08 DIAGNOSIS — D631 Anemia in chronic kidney disease: Secondary | ICD-10-CM | POA: Diagnosis not present

## 2016-12-08 DIAGNOSIS — N179 Acute kidney failure, unspecified: Secondary | ICD-10-CM | POA: Diagnosis not present

## 2016-12-08 DIAGNOSIS — Z7982 Long term (current) use of aspirin: Secondary | ICD-10-CM | POA: Diagnosis not present

## 2016-12-08 DIAGNOSIS — Z951 Presence of aortocoronary bypass graft: Secondary | ICD-10-CM | POA: Diagnosis not present

## 2016-12-08 DIAGNOSIS — Z79899 Other long term (current) drug therapy: Secondary | ICD-10-CM | POA: Diagnosis not present

## 2016-12-08 DIAGNOSIS — Z940 Kidney transplant status: Secondary | ICD-10-CM | POA: Diagnosis not present

## 2016-12-08 DIAGNOSIS — Z87891 Personal history of nicotine dependence: Secondary | ICD-10-CM | POA: Diagnosis not present

## 2016-12-08 DIAGNOSIS — I120 Hypertensive chronic kidney disease with stage 5 chronic kidney disease or end stage renal disease: Secondary | ICD-10-CM | POA: Diagnosis not present

## 2016-12-08 DIAGNOSIS — E119 Type 2 diabetes mellitus without complications: Secondary | ICD-10-CM | POA: Diagnosis not present

## 2016-12-08 DIAGNOSIS — R609 Edema, unspecified: Secondary | ICD-10-CM | POA: Diagnosis not present

## 2016-12-08 DIAGNOSIS — E039 Hypothyroidism, unspecified: Secondary | ICD-10-CM | POA: Diagnosis not present

## 2016-12-08 DIAGNOSIS — Z87440 Personal history of urinary (tract) infections: Secondary | ICD-10-CM | POA: Diagnosis not present

## 2016-12-08 DIAGNOSIS — E1122 Type 2 diabetes mellitus with diabetic chronic kidney disease: Secondary | ICD-10-CM | POA: Diagnosis not present

## 2016-12-08 DIAGNOSIS — I2510 Atherosclerotic heart disease of native coronary artery without angina pectoris: Secondary | ICD-10-CM | POA: Diagnosis not present

## 2016-12-10 DIAGNOSIS — Z4822 Encounter for aftercare following kidney transplant: Secondary | ICD-10-CM | POA: Diagnosis not present

## 2016-12-10 DIAGNOSIS — Z79899 Other long term (current) drug therapy: Secondary | ICD-10-CM | POA: Diagnosis not present

## 2016-12-10 DIAGNOSIS — Z7952 Long term (current) use of systemic steroids: Secondary | ICD-10-CM | POA: Diagnosis not present

## 2016-12-10 DIAGNOSIS — Z792 Long term (current) use of antibiotics: Secondary | ICD-10-CM | POA: Diagnosis not present

## 2016-12-10 DIAGNOSIS — Z5181 Encounter for therapeutic drug level monitoring: Secondary | ICD-10-CM | POA: Diagnosis not present

## 2016-12-14 ENCOUNTER — Telehealth: Payer: Self-pay | Admitting: Cardiovascular Disease

## 2016-12-14 DIAGNOSIS — L570 Actinic keratosis: Secondary | ICD-10-CM | POA: Diagnosis not present

## 2016-12-14 DIAGNOSIS — D485 Neoplasm of uncertain behavior of skin: Secondary | ICD-10-CM | POA: Diagnosis not present

## 2016-12-14 DIAGNOSIS — Z85828 Personal history of other malignant neoplasm of skin: Secondary | ICD-10-CM | POA: Diagnosis not present

## 2016-12-14 DIAGNOSIS — L820 Inflamed seborrheic keratosis: Secondary | ICD-10-CM | POA: Diagnosis not present

## 2016-12-14 NOTE — Telephone Encounter (Signed)
CArotid scheduled in [facility] Feb 16, 2017

## 2016-12-28 DIAGNOSIS — E7800 Pure hypercholesterolemia, unspecified: Secondary | ICD-10-CM | POA: Diagnosis not present

## 2016-12-28 DIAGNOSIS — I10 Essential (primary) hypertension: Secondary | ICD-10-CM | POA: Diagnosis not present

## 2016-12-28 DIAGNOSIS — N189 Chronic kidney disease, unspecified: Secondary | ICD-10-CM | POA: Diagnosis not present

## 2016-12-28 DIAGNOSIS — K219 Gastro-esophageal reflux disease without esophagitis: Secondary | ICD-10-CM | POA: Diagnosis not present

## 2016-12-28 DIAGNOSIS — D519 Vitamin B12 deficiency anemia, unspecified: Secondary | ICD-10-CM | POA: Diagnosis not present

## 2016-12-28 DIAGNOSIS — E875 Hyperkalemia: Secondary | ICD-10-CM | POA: Diagnosis not present

## 2016-12-28 DIAGNOSIS — E1165 Type 2 diabetes mellitus with hyperglycemia: Secondary | ICD-10-CM | POA: Diagnosis not present

## 2017-01-12 ENCOUNTER — Other Ambulatory Visit: Payer: Self-pay | Admitting: Cardiovascular Disease

## 2017-01-12 DIAGNOSIS — I6523 Occlusion and stenosis of bilateral carotid arteries: Secondary | ICD-10-CM

## 2017-02-16 ENCOUNTER — Ambulatory Visit (INDEPENDENT_AMBULATORY_CARE_PROVIDER_SITE_OTHER): Payer: Medicare Other

## 2017-02-16 DIAGNOSIS — I6523 Occlusion and stenosis of bilateral carotid arteries: Secondary | ICD-10-CM | POA: Diagnosis not present

## 2017-02-23 ENCOUNTER — Telehealth: Payer: Self-pay | Admitting: *Deleted

## 2017-02-23 NOTE — Telephone Encounter (Signed)
Notes recorded by Laurine Blazer, LPN on 02/03/7251 at 6:64 AM EST Patient notified. Copy to pmd. Follow up scheduled for 03/01/2017 with Dr. Bronson Ing.   ------  Notes recorded by Laurine Blazer, LPN on 05/04/4740 at 59:56 AM EST Left message to return call.  ------  Notes recorded by Herminio Commons, MD on 02/17/2017 at 4:32 PM EST Very mild blockages. Can be repeated in 2-3 years.

## 2017-03-01 ENCOUNTER — Encounter: Payer: Self-pay | Admitting: Cardiovascular Disease

## 2017-03-01 ENCOUNTER — Ambulatory Visit (INDEPENDENT_AMBULATORY_CARE_PROVIDER_SITE_OTHER): Payer: Medicare Other | Admitting: Cardiovascular Disease

## 2017-03-01 VITALS — BP 146/58 | HR 50 | Ht 70.0 in | Wt 178.0 lb

## 2017-03-01 DIAGNOSIS — I350 Nonrheumatic aortic (valve) stenosis: Secondary | ICD-10-CM

## 2017-03-01 DIAGNOSIS — I6523 Occlusion and stenosis of bilateral carotid arteries: Secondary | ICD-10-CM

## 2017-03-01 DIAGNOSIS — I10 Essential (primary) hypertension: Secondary | ICD-10-CM

## 2017-03-01 DIAGNOSIS — Z951 Presence of aortocoronary bypass graft: Secondary | ICD-10-CM | POA: Diagnosis not present

## 2017-03-01 DIAGNOSIS — I25708 Atherosclerosis of coronary artery bypass graft(s), unspecified, with other forms of angina pectoris: Secondary | ICD-10-CM | POA: Diagnosis not present

## 2017-03-01 DIAGNOSIS — E7800 Pure hypercholesterolemia, unspecified: Secondary | ICD-10-CM

## 2017-03-01 DIAGNOSIS — Z940 Kidney transplant status: Secondary | ICD-10-CM | POA: Diagnosis not present

## 2017-03-01 DIAGNOSIS — R5383 Other fatigue: Secondary | ICD-10-CM

## 2017-03-01 DIAGNOSIS — I209 Angina pectoris, unspecified: Secondary | ICD-10-CM

## 2017-03-01 DIAGNOSIS — R001 Bradycardia, unspecified: Secondary | ICD-10-CM

## 2017-03-01 MED ORDER — LOSARTAN POTASSIUM 50 MG PO TABS: 50.0000 mg | ORAL_TABLET | Freq: Every day | ORAL | 6 refills | Status: DC

## 2017-03-01 MED ORDER — CARVEDILOL 6.25 MG PO TABS: 6.2500 mg | ORAL_TABLET | Freq: Two times a day (BID) | ORAL | 6 refills | Status: DC

## 2017-03-01 MED ORDER — CARVEDILOL 6.25 MG PO TABS: 6.2500 mg | ORAL_TABLET | Freq: Two times a day (BID) | ORAL | 3 refills | Status: AC

## 2017-03-01 MED ORDER — LOSARTAN POTASSIUM 50 MG PO TABS: 50.0000 mg | ORAL_TABLET | Freq: Every day | ORAL | 3 refills | Status: DC

## 2017-03-01 NOTE — Progress Notes (Signed)
ekg 

## 2017-03-01 NOTE — Patient Instructions (Signed)
Medication Instructions:   Decrease Coreg to 6.25mg  twice a day.  Increase Losartan to 50mg  daily.   Continue all other medications.    Labwork: none  Testing/Procedures: none  Follow-Up: Your physician wants you to follow up in:  1 year.  You will receive a reminder letter in the mail one-two months in advance.  If you don't receive a letter, please call our office to schedule the follow up appointment   Any Other Special Instructions Will Be Listed Below (If Applicable).  If you need a refill on your cardiac medications before your next appointment, please call your pharmacy.

## 2017-03-01 NOTE — Progress Notes (Signed)
SUBJECTIVE: The patient presents for follow-up of coronary artery disease with a history of 3 vessel CABG on 10/17/15. He also has a history of a kidney transplant on 09/08/15. He also has hypertension.  Echocardiogram February 2017 demonstrated normal left ventricular systolic function, LVEF 81-85%, with moderate LVH and aortic stenosis.  ECG performed today which I personally reviewed demonstrated sinus bradycardia, 50 bpm, right bundle branch block and nonspecific T wave abnormalities.  He had been having issues with hypertension and was started on carvedilol by his nephrologist at [facility] in November 2018.  It was 194/53 in their office on 12/08/16.  He has been feeling dizzy but denies falls and syncope.  He denies chest pain and exertional dyspnea.  He has had some mild left leg swelling which is the same leg the saphenous vein was harvested for CABG.  Lipids 09/09/16: Total cholesterol 133, triglycerides 121, HDL 54, LDL 70.  He and his wife went on a cruise to [place] last year.  They are going to [place] this year.  After that they are going on a cruise and traveling to [place], [place], [place], and other countries.  They have visited 17 out of the 38 [place].  They are yet to visit [place], [place], [place], and [place] among others.  Review of Systems: As per "subjective", otherwise negative.  Allergies  Allergen Reactions  . Codeine Nausea Only  . Sulfa Antibiotics Nausea Only    Current Outpatient Medications  Medication Sig Dispense Refill  . amLODipine (NORVASC) 10 MG tablet Take 10 mg by mouth daily.    Marland Kitchen aspirin EC 81 MG tablet Take 162 mg by mouth daily.    Marland Kitchen atorvastatin (LIPITOR) 20 MG tablet Take 20 mg by mouth every Monday, Wednesday, and Friday.    . carvedilol (COREG) 12.5 MG tablet Take 12.5 mg by mouth 2 (two) times daily with a meal.    . dapsone 25 MG tablet Take 2 tablets by mouth daily.    Marland Kitchen docusate sodium (COLACE) 100 MG capsule  Take 100 mg by mouth 2 (two) times daily as needed.    . insulin glargine (LANTUS SOLOSTAR) 100 UNIT/ML injection Inject 18 Units into the skin daily.     Marland Kitchen levothyroxine (SYNTHROID, LEVOTHROID) 125 MCG tablet Take 125 mcg by mouth daily before breakfast.    . losartan (COZAAR) 25 MG tablet Take 25 mg by mouth daily.    . mycophenolate (MYFORTIC) 180 MG EC tablet Take 180 mg by mouth 2 (two) times daily.     . nitroGLYCERIN (NITROSTAT) 0.4 MG SL tablet Place 1 tablet (0.4 mg total) under the tongue every 5 (five) minutes x 3 doses as needed for chest pain. If no relief after 3 rd dose, proceed to the ED for an evaluation 25 tablet 1  . NOVOLOG FLEXPEN 100 UNIT/ML FlexPen Inject 10 Units into the skin 3 (three) times daily. Plus sliding scale  1  . predniSONE (DELTASONE) 5 MG tablet Take 5 mg by mouth daily with breakfast.    . TACROLIMUS PO Take 2 mg by mouth. 2 tablets daily     No current facility-administered medications for this visit.    Facility-Administered Medications Ordered in Other Visits  Medication Dose Route Frequency Provider Last Rate Last Dose  . cyclopentolate-phenylephrine (CYCLOMYDRYL) 0.2-1 % ophthalmic solution 1 drop  1 drop Right Eye Once Williams Che, MD      . ketorolac (ACULAR) 0.5 % ophthalmic solution 1  drop  1 drop Right Eye Once Williams Che, MD        Past Medical History:  Diagnosis Date  . Anemia   . Anginal pain ([place])    "a little."   . Aortic stenosis    Mild  . Chronic kidney disease    pt states 30% kidney function started dialysis 04/23/14 T/Th/Sa  . Constipation   . Coronary atherosclerosis of native coronary artery    DES LAD 8/03  . Essential hypertension, benign   . Heart murmur    "faint"  . Hypothyroidism   . Mixed hyperlipidemia   . Pneumonia   . Shortness of breath    with exertion  . Type 2 diabetes mellitus ([place])     Past Surgical History:  Procedure Laterality Date  . AV FISTULA PLACEMENT Right 05/08/2014    Procedure: ARTERIOVENOUS (AV) FISTULA CREATION-RIGHT;  Surgeon: Mal Misty, MD;  Location: [facility];  Service: Vascular;  Laterality: Right;  . CARDIAC CATHETERIZATION    . CATARACT EXTRACTION W/PHACO  02/07/2012   Procedure: CATARACT EXTRACTION PHACO AND INTRAOCULAR LENS PLACEMENT (IOC);  Surgeon: Williams Che, MD;  Location: [facility];  Service: Ophthalmology;  Laterality: Right;  CDE:24.87  . CATARACT EXTRACTION W/PHACO Left 04/17/2012   Procedure: CATARACT EXTRACTION PHACO AND INTRAOCULAR LENS PLACEMENT (IOC);  Surgeon: Williams Che, MD;  Location: [facility];  Service: Ophthalmology;  Laterality: Left;  CDE=20.75  . COLONOSCOPY    . cyst removed     from back  . INSERTION OF DIALYSIS CATHETER Right 04/22/2014   Procedure: INSERTION OF DIALYSIS CATHETER RIGHT INTERNAL JUGULAR VEIN;  Surgeon: Rosetta Posner, MD;  Location: [facility];  Service: Vascular;  Laterality: Right;  . jaw implant    . NEPHRECTOMY     Left   . ROTATOR CUFF REPAIR Left    left  . VASECTOMY      Social History   Socioeconomic History  . Marital status: Married    Spouse name: Not on file  . Number of children: Not on file  . Years of education: Not on file  . Highest education level: Not on file  Social Needs  . Financial resource strain: Not on file  . Food insecurity - worry: Not on file  . Food insecurity - inability: Not on file  . Transportation needs - medical: Not on file  . Transportation needs - non-medical: Not on file  Occupational History  . Not on file  Tobacco Use  . Smoking status: Former Smoker    Packs/day: 1.50    Years: 11.00    Pack years: 16.50    Types: Cigarettes    Start date: 08/14/1955    Last attempt to quit: 12/15/1966    Years since quitting: 50.2  . Smokeless tobacco: Never Used  Substance and Sexual Activity  . Alcohol use: No    Alcohol/week: 0.0 oz  . Drug use: No  . Sexual activity: Not on file  Other Topics Concern  . Not on file  Social History Narrative  . Not  on file     Vitals:   03/01/17 1049  BP: (!) 146/58  Pulse: (!) 50  SpO2: 93%  Weight: 178 lb (80.7 kg)  Height: 5\' 10"  (1.778 m)    Wt Readings from Last 3 Encounters:  03/01/17 178 lb (80.7 kg)  10/05/16 171 lb 4.8 oz (77.7 kg)  06/15/16 166 lb 7.2 oz (75.5 kg)     PHYSICAL EXAM  General: NAD HEENT: Normal. Neck: No JVD, no thyromegaly. Lungs: Clear to auscultation bilaterally with normal respiratory effort. CV: Bradycardic, regular rhythm, normal S1/S2, no R4/Y7, 1/6 systolic murmur over RUSB.  Trivial left leg edema.  No carotid bruit.   Abdomen: Soft, nontender, no distention.  Neurologic: Alert and oriented.  Psych: Normal affect. Skin: Normal. Musculoskeletal: No gross deformities.    ECG: Most recent ECG reviewed.   Labs: Lab Results  Component Value Date/Time   K 3.4 (L) 05/08/2014 06:29 AM   BUN 56 (H) 04/11/2012 01:00 PM   CREATININE 2.92 (H) 04/11/2012 01:00 PM   HGB 10.2 (L) 05/08/2014 06:29 AM     Lipids: No results found for: LDLCALC, LDLDIRECT, CHOL, TRIG, HDL     ASSESSMENT AND PLAN:  1. Coronary artery disease with 3 vessel CABG on 10/17/15: Symptomatically stable. Continue aspirin, beta blocker (I will reduce Coreg to 6.25 mg twice daily due to fatigue and dizziness), and statin.  2. Hypertension: Remains elevated.  He complains of feeling fatigued and he is bradycardic.  I will reduce Coreg back to 6.25 mg twice daily.  I will increase losartan to 50 mg.  Hypertension is being exacerbated by immunosuppressive therapy.  3. Aortic stenosis: Mild with a mean gradient of 7 mmHg. Continue surveillance echocardiography.  4. Hyperlipidemia: Lipids reviewed above.  Continue Lipitor 20 mg.  5. History of renal transplant in August 2017: Stable. On immunosuppressive therapy.  6. Carotid artery stenosis: Mild bilateral disease on 02/17/17. Will repeat in next 3 years.     Disposition: Follow up 1 year   Kate Sable, M.D.,  F.A.C.C.

## 2017-07-14 ENCOUNTER — Telehealth: Payer: Self-pay | Admitting: *Deleted

## 2017-07-14 MED ORDER — LOSARTAN POTASSIUM 100 MG PO TABS: 100.0000 mg | ORAL_TABLET | Freq: Every day | ORAL | 1 refills | Status: DC

## 2017-07-14 NOTE — Telephone Encounter (Signed)
Increase losartan to 100mg daily

## 2017-07-14 NOTE — Telephone Encounter (Signed)
Pt says for the last 3 days BP readings have been 212/85 205/88 202/84 HR remaining in the 50s - denies any symptoms - taking carvedilol 6.25 mg bid - losartan 50 mg daily - amlodipine 10 mg daily

## 2017-07-14 NOTE — Telephone Encounter (Signed)
Pt aware and voiced understanding - Losartan 100 mg sent to [facility] as requested

## 2017-10-28 ENCOUNTER — Other Ambulatory Visit: Payer: Self-pay | Admitting: Cardiovascular Disease

## 2017-11-01 ENCOUNTER — Other Ambulatory Visit: Payer: Self-pay | Admitting: Cardiovascular Disease

## 2017-12-02 DEATH — deceased
